# Patient Record
Sex: Female | Born: 1980 | Race: Black or African American | Hispanic: No | Marital: Single | Smoking: Never smoker
Health system: Southern US, Community
[De-identification: ages and names within clinical notes are randomized; demographics above are authoritative.]

## PROBLEM LIST (undated history)

## (undated) DIAGNOSIS — I10 Essential (primary) hypertension: Secondary | ICD-10-CM

## (undated) DIAGNOSIS — E669 Obesity, unspecified: Secondary | ICD-10-CM

## (undated) DIAGNOSIS — I2699 Other pulmonary embolism without acute cor pulmonale: Secondary | ICD-10-CM

## (undated) HISTORY — DX: Essential (primary) hypertension: I10

## (undated) HISTORY — PX: WISDOM TOOTH EXTRACTION: SHX21

## (undated) HISTORY — PX: MOUTH SURGERY: SHX715

---

## 2003-01-28 ENCOUNTER — Emergency Department (HOSPITAL_COMMUNITY): Admission: EM | Admit: 2003-01-28 | Discharge: 2003-01-28 | Payer: Self-pay | Admitting: Emergency Medicine

## 2003-01-30 ENCOUNTER — Inpatient Hospital Stay (HOSPITAL_COMMUNITY): Admission: AD | Admit: 2003-01-30 | Discharge: 2003-01-30 | Payer: Self-pay | Admitting: *Deleted

## 2003-02-06 ENCOUNTER — Encounter: Admission: RE | Admit: 2003-02-06 | Discharge: 2003-02-06 | Payer: Self-pay | Admitting: *Deleted

## 2003-02-13 ENCOUNTER — Encounter: Admission: RE | Admit: 2003-02-13 | Discharge: 2003-02-13 | Payer: Self-pay | Admitting: *Deleted

## 2003-02-16 ENCOUNTER — Ambulatory Visit (HOSPITAL_COMMUNITY): Admission: RE | Admit: 2003-02-16 | Discharge: 2003-02-16 | Payer: Self-pay | Admitting: *Deleted

## 2003-02-16 ENCOUNTER — Encounter: Admission: RE | Admit: 2003-02-16 | Discharge: 2003-02-16 | Payer: Self-pay | Admitting: *Deleted

## 2003-02-20 ENCOUNTER — Encounter: Admission: RE | Admit: 2003-02-20 | Discharge: 2003-02-20 | Payer: Self-pay | Admitting: *Deleted

## 2003-02-27 ENCOUNTER — Ambulatory Visit (HOSPITAL_COMMUNITY): Admission: RE | Admit: 2003-02-27 | Discharge: 2003-02-27 | Payer: Self-pay | Admitting: Family Medicine

## 2003-02-27 ENCOUNTER — Encounter: Admission: RE | Admit: 2003-02-27 | Discharge: 2003-02-27 | Payer: Self-pay | Admitting: *Deleted

## 2003-03-01 ENCOUNTER — Inpatient Hospital Stay (HOSPITAL_COMMUNITY): Admission: AD | Admit: 2003-03-01 | Discharge: 2003-03-04 | Payer: Self-pay | Admitting: Obstetrics & Gynecology

## 2003-08-04 ENCOUNTER — Emergency Department (HOSPITAL_COMMUNITY): Admission: EM | Admit: 2003-08-04 | Discharge: 2003-08-04 | Payer: Self-pay | Admitting: Emergency Medicine

## 2005-04-20 ENCOUNTER — Emergency Department (HOSPITAL_COMMUNITY): Admission: EM | Admit: 2005-04-20 | Discharge: 2005-04-21 | Payer: Self-pay | Admitting: Emergency Medicine

## 2005-12-04 ENCOUNTER — Emergency Department (HOSPITAL_COMMUNITY): Admission: EM | Admit: 2005-12-04 | Discharge: 2005-12-05 | Payer: Self-pay | Admitting: Emergency Medicine

## 2010-01-07 ENCOUNTER — Ambulatory Visit: Payer: Self-pay | Admitting: Obstetrics and Gynecology

## 2010-01-07 ENCOUNTER — Inpatient Hospital Stay (HOSPITAL_COMMUNITY): Admission: AD | Admit: 2010-01-07 | Discharge: 2010-01-07 | Payer: Self-pay | Admitting: Obstetrics

## 2010-03-06 ENCOUNTER — Ambulatory Visit (HOSPITAL_COMMUNITY)
Admission: RE | Admit: 2010-03-06 | Discharge: 2010-03-06 | Payer: Self-pay | Source: Home / Self Care | Admitting: Obstetrics

## 2010-05-23 ENCOUNTER — Inpatient Hospital Stay (HOSPITAL_COMMUNITY)
Admission: AD | Admit: 2010-05-23 | Discharge: 2010-05-23 | Disposition: A | Discharge status: Home / Self Care | Payer: Medicaid Other | Source: Ambulatory Visit | Attending: Obstetrics | Admitting: Obstetrics

## 2010-05-23 DIAGNOSIS — Z3480 Encounter for supervision of other normal pregnancy, unspecified trimester: Secondary | ICD-10-CM | POA: Insufficient documentation

## 2010-05-23 DIAGNOSIS — Z298 Encounter for other specified prophylactic measures: Secondary | ICD-10-CM | POA: Insufficient documentation

## 2010-05-23 DIAGNOSIS — Z2989 Encounter for other specified prophylactic measures: Secondary | ICD-10-CM | POA: Insufficient documentation

## 2010-05-26 LAB — RH IMMUNE GLOBULIN WORKUP (NOT WOMEN'S HOSP)
ABO/RH(D): B NEG
Antibody Screen: NEGATIVE
Unit division: 0

## 2010-06-18 LAB — POCT PREGNANCY, URINE: Preg Test, Ur: POSITIVE

## 2010-07-06 ENCOUNTER — Inpatient Hospital Stay (HOSPITAL_COMMUNITY)
Admission: AD | Admit: 2010-07-06 | Discharge: 2010-07-12 | DRG: 775 | Disposition: A | Discharge status: Home / Self Care | Payer: Medicaid Other | Source: Ambulatory Visit | Attending: Obstetrics & Gynecology | Admitting: Obstetrics & Gynecology

## 2010-07-06 DIAGNOSIS — Z0489 Encounter for examination and observation for other specified reasons: Secondary | ICD-10-CM

## 2010-07-06 DIAGNOSIS — O365990 Maternal care for other known or suspected poor fetal growth, unspecified trimester, not applicable or unspecified: Principal | ICD-10-CM | POA: Diagnosis present

## 2010-07-06 DIAGNOSIS — IMO0002 Reserved for concepts with insufficient information to code with codable children: Secondary | ICD-10-CM

## 2010-07-06 LAB — CBC
HCT: 34.3 % — ABNORMAL LOW (ref 36.0–46.0)
Hemoglobin: 11.4 g/dL — ABNORMAL LOW (ref 12.0–15.0)
MCH: 27.9 pg (ref 26.0–34.0)
MCHC: 33.2 g/dL (ref 30.0–36.0)
MCV: 84.1 fL (ref 78.0–100.0)
Platelets: 274 10*3/uL (ref 150–400)
RBC: 4.08 MIL/uL (ref 3.87–5.11)
RDW: 13.6 % (ref 11.5–15.5)
WBC: 11.7 10*3/uL — ABNORMAL HIGH (ref 4.0–10.5)

## 2010-07-07 LAB — RPR: RPR Ser Ql: NONREACTIVE

## 2010-07-09 NOTE — H&P (Signed)
  NAMEROBIN, Karen Robles              ACCOUNT NO.:  [id number]  MEDICAL RECORD NO.:  [id number]           PATIENT TYPE:  I  LOCATION:  9164                          FACILITY:  [facility]  PHYSICIAN:  Roseanna Rainbow, M.D.DATE OF BIRTH:  [birth year]  DATE OF ADMISSION:  07/06/2010 DATE OF DISCHARGE:                             HISTORY & PHYSICAL   CHIEF COMPLAINT:  The patient is a 30 year old para 1 with an estimated date of confinement of April 8 for induction of labor secondary to IUGR.  HISTORY OF PRESENT ILLNESS:  The patient had an ultrasound for growth performed in the early third trimeste that was consistent with IUGR with the estimated fetal weight at less than 10th percentile. Antenatal testing has been reassuring.  ALLERGIES:  No known drug allergies.  MEDICATIONS:  Please see the medication reconciliation form.  OBSTETRIC RISK FACTORS:  Please see the above.  Rh negative, non sensitized and fetal pyelectasis on an ultrasound that resolved on a subsequent study.  PRENATAL LABS:  Blood type is B negative, antibody screen negative, 2- hour GTT normal.  Hepatitis B surface antigen negative.  Hematocrit 36.2, hemoglobin 11.5, HIV nonreactive, platelets 332,000, RPR nonreactive, rubella immune.  Sickle cell negative.  Urine culture and sensitivity negative.  GBS negative on March 7.  PAST OB HISTORY:  In November 2004, she was delivered at term 4-pound female, small for gestational age.  No other complications.  PAST GYN HISTORY:  There is a history of gonorrhea.  PAST MEDICAL HISTORY:  She denies.  PAST SURGICAL HISTORY:  She denies.  SOCIAL HISTORY:  She is a homemaker, single, does not give any significant history of alcohol usage, has no significant smoking history.  Denies illicit drug use.  FAMILY HISTORY:  Remarkable for hypertension.  REVIEW OF SYSTEMS:  Noncontributory.  PHYSICAL EXAM:  VITAL SIGNS:  Stable, afebrile.  Fetal heart tracing baseline 120,  moderate long-term variability.  Tocodynamometer, no uterine contractions.  Sterile vaginal exam per the RN.  ASSESSMENT:  Primipara at 39 weeks with the pregnancy complicated by intrauterine growth restriction, estimated fetal weight is less than 10th percentile.  Unfavorable Bishop score.  Presently, a category 1 fetal heart tracing.  PLAN:  Admission, II stage induction of labor.     Roseanna Rainbow, M.D.     Judee Clara  D:  07/07/2010  T:  07/07/2010  Job:  [id number]  Electronically Signed by Antionette Char M.D. on 07/09/2010 10:17:33 PM

## 2010-07-10 ENCOUNTER — Other Ambulatory Visit: Payer: Self-pay | Admitting: Obstetrics & Gynecology

## 2010-07-10 ENCOUNTER — Inpatient Hospital Stay (HOSPITAL_COMMUNITY): Payer: Medicaid Other

## 2010-07-11 LAB — CBC
HCT: 29.9 % — ABNORMAL LOW (ref 36.0–46.0)
Hemoglobin: 9.7 g/dL — ABNORMAL LOW (ref 12.0–15.0)
MCH: 27.5 pg (ref 26.0–34.0)
MCHC: 32.4 g/dL (ref 30.0–36.0)
MCV: 84.7 fL (ref 78.0–100.0)
Platelets: 259 10*3/uL (ref 150–400)
RBC: 3.53 MIL/uL — ABNORMAL LOW (ref 3.87–5.11)
RDW: 14 % (ref 11.5–15.5)
WBC: 23.4 10*3/uL — ABNORMAL HIGH (ref 4.0–10.5)

## 2010-07-12 LAB — RH IMMUNE GLOB WKUP(>/=20WKS)(NOT WOMEN'S HOSP)
Fetal Screen: NEGATIVE
Unit division: 0

## 2010-07-13 ENCOUNTER — Inpatient Hospital Stay (HOSPITAL_COMMUNITY): Admission: AD | Admit: 2010-07-13 | Payer: Self-pay | Admitting: Obstetrics

## 2010-07-22 ENCOUNTER — Other Ambulatory Visit (HOSPITAL_COMMUNITY): Payer: Medicaid Other

## 2010-08-14 NOTE — Discharge Summary (Signed)
  Karen Robles, Karen Robles              ACCOUNT NO.:  [id number]  MEDICAL RECORD NO.:  [id number]           PATIENT TYPE:  I  LOCATION:  9114                          [facility]ILITY:  [facility]  PHYSICIAN:  Charles A. Clearance Coots, M.D.DATE OF BIRTH:  1980-08-10  DATE OF ADMISSION:  07/06/2010 DATE OF DISCHARGE:  07/12/2010                              DISCHARGE SUMMARY   ADMITTING DIAGNOSIS:  Intrauterine growth restriction, 39 weeks' gestation.  DISCHARGE DIAGNOSIS:  Intrauterine growth restriction, 39 weeks' gestation status post induction of labor and normal spontaneous vaginal delivery of viable female on July 10, 2010, at 10:46, Apgars 9 at 1 minute and 9 at 5 minutes, weight of 3100 g, length of 50.17 cm.  Mother and infant discharged home in good condition.  REASON FOR ADMISSION:  This 30 year old para 1 with estimated date of confinement of July 13, 2010, presents for induction of labor secondary to intrauterine growth restriction.  The patient had an ultrasound for growth performed in the early third trimester that was consistent with IUGR with the estimated fetal weight at less than 10th percentile. Antenatal testing has been reassuring.  PAST MEDICAL HISTORY:  Surgery none.  Illnesses none.  MEDICATIONS:  Prenatal vitamins.  ALLERGIES:  No known drug allergies.  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  FAMILY HISTORY:  Hypertension.  OBSTETRICAL RISK FACTORS:  Intrauterine growth restriction, Rh negative.  PAST OBSTETRICAL HISTORY:  Status post term 4-pound female normal spontaneous vaginal delivery in November 2004.  The infant was small for gestational age, otherwise, no other complications.  PAST GYNECOLOGIC HISTORY:  Positive for gonorrhea.  PHYSICAL EXAMINATION:  GENERAL:  A well-nourished, well-developed female, in no acute distress.  She is  afebrile. VITAL SIGNS:  Stable. LUNGS:  Clear to auscultation bilaterally. HEART:  Regular rate and rhythm. ABDOMEN:  Gravid, nontender. PELVIC:  Cervix is long, closed, vertex at -3 station.  ADMITTING LABORATORY DATA:  Hemoglobin 11.4, hematocrit 34.3, white blood cell count 11,700, platelets 274,000.  RPR was nonreactive.  HOSPITAL COURSE:  The patient was admitted and underwent two-stage induction of labor with cervical ripening which was unsuccessful after the first 24 hours.  Foley bulb ripening was then done which was successful, and the patient progressed in labor to normal spontaneous vaginal delivery of viable female infant on July 10, 2010.  There were no intrapartum complications.  Postpartum course was uncomplicated.  The patient was discharged home on postpartum day 2 in good condition.  DISCHARGE LABORATORY DATA:  Hemoglobin 9.7, hematocrit 29.3, white blood cell count 23,000, platelets 269,000.  DISCHARGE DISPOSITION:  Medications:  Continue prenatal vitamins. Ibuprofen was prescribed for pain.  Routine written instructions were given for discharge after vaginal delivery.  The patient is to call office for followup appointment in 6 weeks.     Charles A. Clearance Coots, M.D.     CAH/MEDQ  D:  08/08/2010  T:  08/09/2010  Job:  [id number]  Electronically Signed by Coral Ceo M.D. on 08/14/2010 09:16:05 AM

## 2010-10-25 ENCOUNTER — Emergency Department (HOSPITAL_COMMUNITY)
Admission: EM | Admit: 2010-10-25 | Discharge: 2010-10-25 | Disposition: A | Discharge status: Home / Self Care | Payer: Medicaid Other | Attending: Emergency Medicine | Admitting: Emergency Medicine

## 2010-10-25 DIAGNOSIS — K089 Disorder of teeth and supporting structures, unspecified: Secondary | ICD-10-CM | POA: Insufficient documentation

## 2010-10-25 DIAGNOSIS — E669 Obesity, unspecified: Secondary | ICD-10-CM | POA: Insufficient documentation

## 2010-10-25 DIAGNOSIS — K029 Dental caries, unspecified: Secondary | ICD-10-CM | POA: Insufficient documentation

## 2010-10-25 DIAGNOSIS — R221 Localized swelling, mass and lump, neck: Secondary | ICD-10-CM | POA: Insufficient documentation

## 2010-10-25 DIAGNOSIS — R220 Localized swelling, mass and lump, head: Secondary | ICD-10-CM | POA: Insufficient documentation

## 2012-07-05 ENCOUNTER — Ambulatory Visit: Payer: Self-pay

## 2012-07-07 ENCOUNTER — Ambulatory Visit (INDEPENDENT_AMBULATORY_CARE_PROVIDER_SITE_OTHER): Payer: Medicaid Other | Admitting: *Deleted

## 2012-07-07 ENCOUNTER — Encounter: Payer: Self-pay | Admitting: *Deleted

## 2012-07-07 VITALS — BP 126/90 | HR 100 | Temp 97.0°F | Wt 268.0 lb

## 2012-07-07 DIAGNOSIS — Z3049 Encounter for surveillance of other contraceptives: Secondary | ICD-10-CM

## 2012-07-07 MED ORDER — MEDROXYPROGESTERONE ACETATE 150 MG/ML IM SUSP
150.0000 mg | Freq: Once | INTRAMUSCULAR | Status: AC
  Administered 2012-07-07: 150 mg via INTRAMUSCULAR

## 2012-07-07 NOTE — Progress Notes (Signed)
Medroxyprogesterone given IM in right deltoid per pt's request. Pt tolerated well.

## 2012-07-07 NOTE — Patient Instructions (Signed)
Return to clinic as directed and as needed.

## 2012-09-28 ENCOUNTER — Ambulatory Visit (INDEPENDENT_AMBULATORY_CARE_PROVIDER_SITE_OTHER): Payer: Medicaid Other | Admitting: *Deleted

## 2012-09-28 ENCOUNTER — Ambulatory Visit: Payer: Medicaid Other

## 2012-09-28 VITALS — BP 133/96 | HR 81 | Temp 98.2°F | Ht 64.0 in | Wt 265.0 lb

## 2012-09-28 DIAGNOSIS — Z309 Encounter for contraceptive management, unspecified: Secondary | ICD-10-CM

## 2012-09-28 DIAGNOSIS — IMO0001 Reserved for inherently not codable concepts without codable children: Secondary | ICD-10-CM

## 2012-09-28 MED ORDER — MEDROXYPROGESTERONE ACETATE 150 MG/ML IM SUSP
150.0000 mg | INTRAMUSCULAR | Status: AC
  Administered 2012-09-28 – 2013-08-23 (×4): 150 mg via INTRAMUSCULAR

## 2012-09-28 NOTE — Progress Notes (Signed)
Patient is here today for her Depo injection.  Patient tolerated well.  RTO for next injection 12/20/12.

## 2012-12-19 ENCOUNTER — Other Ambulatory Visit: Payer: Self-pay | Admitting: *Deleted

## 2012-12-19 DIAGNOSIS — IMO0001 Reserved for inherently not codable concepts without codable children: Secondary | ICD-10-CM

## 2012-12-19 MED ORDER — MEDROXYPROGESTERONE ACETATE 150 MG/ML IM SUSP: 150.0000 mg | INTRAMUSCULAR | Status: DC

## 2012-12-20 ENCOUNTER — Ambulatory Visit (INDEPENDENT_AMBULATORY_CARE_PROVIDER_SITE_OTHER): Payer: Medicaid Other | Admitting: Obstetrics

## 2012-12-20 VITALS — BP 134/88 | HR 91 | Temp 97.8°F | Wt 269.0 lb

## 2012-12-20 DIAGNOSIS — Z3042 Encounter for surveillance of injectable contraceptive: Secondary | ICD-10-CM

## 2012-12-20 DIAGNOSIS — Z3049 Encounter for surveillance of other contraceptives: Secondary | ICD-10-CM

## 2012-12-20 NOTE — Progress Notes (Signed)
Patient here for depo injection, she was advised that she is past due for annual exam. She was advised to return between 03/07/2013 and 03/21/2013 for annual exam with depo injection. Patient was advised she would not receive next injection without annual exam being she is past due. Patient has verbalized understanding and has agreed as instructed to schedule annual exam with depo.

## 2013-01-22 ENCOUNTER — Emergency Department (HOSPITAL_COMMUNITY)
Admission: EM | Admit: 2013-01-22 | Discharge: 2013-01-22 | Disposition: A | Discharge status: Home / Self Care | Payer: Medicaid Other | Attending: Emergency Medicine | Admitting: Emergency Medicine

## 2013-01-22 ENCOUNTER — Encounter (HOSPITAL_COMMUNITY): Payer: Self-pay | Admitting: Emergency Medicine

## 2013-01-22 DIAGNOSIS — Z79899 Other long term (current) drug therapy: Secondary | ICD-10-CM | POA: Insufficient documentation

## 2013-01-22 DIAGNOSIS — E669 Obesity, unspecified: Secondary | ICD-10-CM | POA: Insufficient documentation

## 2013-01-22 DIAGNOSIS — B009 Herpesviral infection, unspecified: Secondary | ICD-10-CM | POA: Insufficient documentation

## 2013-01-22 DIAGNOSIS — B001 Herpesviral vesicular dermatitis: Secondary | ICD-10-CM

## 2013-01-22 HISTORY — DX: Obesity, unspecified: E66.9

## 2013-01-22 MED ORDER — DOCOSANOL 10 % EX CREA: 1.0000 [drp] | TOPICAL_CREAM | Freq: Every day | CUTANEOUS | Status: DC

## 2013-01-22 NOTE — ED Provider Notes (Signed)
Medical screening examination/treatment/procedure(s) were conducted as a shared visit with non-physician practitioner(s) and myself.  I personally evaluated the patient during the encounter  Patient with R sided lower lip swelling. Cold sore on R lower lip with swelling. Burning sensation associated with this. No stridor, airway swelling. Airway patent, no SOB. Given cold sore precautions and instructions.   Dagmar Hait, MD 01/22/13 2036

## 2013-01-22 NOTE — ED Provider Notes (Signed)
CSN: [id number]     Arrival date & time 01/22/13  1831 History   First MD Initiated Contact with Patient 01/22/13 1837     Chief Complaint  Patient presents with  . Oral Swelling   (Consider location/radiation/quality/duration/timing/severity/associated sxs/prior Treatment) HPI Pt is a 32yo female c/o right lower lip swelling, redness and pain that started 1 week ago.  Pain is burning in nature, 2/10, worse with application of warm compresses which causes lesion to drain "white stuff."  Has tried benadryl, ibuprofen, cold compresses and neosporin without relief.  Denies cold-like symptoms, hx of similar symptoms, or known allergies. Denies being bit by anything. Denies throat pain, trouble breathing or swallowing.  Past Medical History  Diagnosis Date  . Obesity    Past Surgical History  Procedure Laterality Date  . Mouth surgery     Family History  Problem Relation Age of Onset  . Hypertension Mother     maternal aunts & uncles  . Cancer Neg Hx   . Diabetes Neg Hx   . Heart disease Neg Hx   . Healthy Brother    History  Substance Use Topics  . Smoking status: Never Smoker   . Smokeless tobacco: Never Used  . Alcohol Use: No   OB History   Grav Para Term Preterm Abortions TAB SAB Ect Mult Living   2 2 2             Review of Systems  Constitutional: Negative for fever and chills.  HENT: Positive for mouth sores ( right lower lip). Negative for congestion, dental problem, rhinorrhea, sore throat and trouble swallowing.   Gastrointestinal: Negative for nausea and vomiting.  All other systems reviewed and are negative.    Allergies  Review of patient's allergies indicates no known allergies.  Home Medications   Current Outpatient Rx  Name  Route  Sig  Dispense  Refill  . ibuprofen (ADVIL,MOTRIN) 200 MG tablet   Oral   Take 400 mg by mouth every 6 (six) hours as needed for pain.         . medroxyPROGESTERone (DEPO-PROVERA) 150 MG/ML injection   Intramuscular   Inject 1 mL (150 mg total) into the muscle every 3 (three) months.   1 mL   3   . Docosanol 10 % CREA   Apply externally   Apply 1 drop topically 5 (five) times daily. Apply cream 5 times daily over cold sore until symptoms resolve.   1 Tube   0    BP 140/98  Pulse 90  Temp(Src) 97.9 F (36.6 C) (Oral)  Resp 16  SpO2 100% Physical Exam  Nursing note and vitals reviewed. Constitutional: She appears well-developed and well-nourished. No distress.  HENT:  Head: Normocephalic and atraumatic.  Right Ear: Hearing, tympanic membrane, external ear and ear canal normal.  Left Ear: Hearing, tympanic membrane, external ear and ear canal normal.  Nose: Nose normal.  Mouth/Throat: Uvula is midline, oropharynx is clear and moist and mucous membranes are normal. She does not have dentures. Oral lesions ( right lower lip erythema with centralized yellow patches.  TTP) present. No trismus in the jaw. Normal dentition. No dental abscesses, uvula swelling, lacerations or dental caries. No oropharyngeal exudate, posterior oropharyngeal edema, posterior oropharyngeal erythema or tonsillar abscesses.    Eyes: Conjunctivae are normal. No scleral icterus.  Neck: Normal range of motion.  Cardiovascular: Normal rate, regular rhythm and normal heart sounds.   Pulmonary/Chest: Effort normal and breath sounds normal. No respiratory distress. She  has no wheezes. She has no rales. She exhibits no tenderness.  Abdominal: Soft. Bowel sounds are normal. She exhibits no distension and no mass. There is no tenderness. There is no rebound and no guarding.  Musculoskeletal: Normal range of motion.  Neurological: She is alert.  Skin: Skin is warm and dry. She is not diaphoretic.    ED Course  Procedures (including critical care time) Labs Review Labs Reviewed - No data to display Imaging Review No results found.  EKG Interpretation   None       MDM   1. Cold sore    Pt presenting with oral lesion on  right lower lip, consistent with that of a cold sore.  Not concerned for allergic reaction or anaphylaxis.  Oropharynx unremarkable. No lesions in throat, tonsillar edema or erythema.  Rx: Abreva. All questions answered and concerns addressed. Will discharge pt home and have pt f/u with [facility] info provided. Return precautions given. Pt verbalized understanding and agreement with tx plan. Vitals: unremarkable. Discharged in stable condition.  Pt info packet on cold sores provided.   Discussed pt with attending during ED encounter and agrees with plan.   Junius Finner, PA-C 01/22/13 1950

## 2013-01-22 NOTE — ED Notes (Signed)
Pt has cold sore on her bottom right lip. Pt states that she thought she was allergic to something but denies changes to daily routine and the sore has been there for a couple of days.

## 2013-01-22 NOTE — ED Notes (Signed)
Pt reports swelling and burning pain to right lower lip x 1 week. Airway intact.

## 2013-02-08 ENCOUNTER — Encounter: Payer: Self-pay | Admitting: Obstetrics & Gynecology

## 2013-02-08 ENCOUNTER — Ambulatory Visit (INDEPENDENT_AMBULATORY_CARE_PROVIDER_SITE_OTHER): Payer: Medicaid Other | Admitting: Obstetrics & Gynecology

## 2013-02-08 VITALS — BP 146/95 | HR 92 | Temp 98.1°F | Ht 64.0 in | Wt 275.0 lb

## 2013-02-08 DIAGNOSIS — Z0000 Encounter for general adult medical examination without abnormal findings: Secondary | ICD-10-CM

## 2013-02-08 LAB — HDL CHOLESTEROL: HDL: 39 mg/dL — ABNORMAL LOW (ref >39)

## 2013-02-08 LAB — HEMOGLOBIN A1C
Hgb A1c MFr Bld: 5.7 % — ABNORMAL HIGH (ref <5.7)
Mean Plasma Glucose: 117 mg/dL — ABNORMAL HIGH (ref <117)

## 2013-02-08 LAB — HEMOGLOBIN AND HEMATOCRIT, BLOOD
HCT: 39.3 % (ref 36.0–46.0)
Hemoglobin: 13 g/dL (ref 12.0–15.0)

## 2013-02-08 LAB — CHOLESTEROL, TOTAL: Cholesterol: 135 mg/dL (ref 0–200)

## 2013-02-08 NOTE — Progress Notes (Signed)
Subjective:     Karen Robles is a 32 y.o. female here for a routine exam.  Current complaints: pt denies any concerns at this time.  Personal health questionnaire reviewed: yes.   Gynecologic History No LMP recorded. Patient has had an injection. Contraception: injection Last Pap: 2012. Results were: normal   Obstetric History OB History  Gravida Para Term Preterm AB SAB TAB Ectopic Multiple Living  2 2 2            # Outcome Date GA Lbr Len/2nd Weight Sex Delivery Anes PTL Lv  2 TRM 2012 [redacted]w[redacted]d   F      1 TRM 2004   6 lb 4 oz (2.835 kg) F           The following portions of the patient's history were reviewed and updated as appropriate: allergies, current medications, past family history, past medical history, past social history, past surgical history and problem list.  Review of Systems Pertinent items are noted in HPI.    Objective:      General appearance: alert Breasts: normal appearance, no masses or tenderness Abdomen: soft, non-tender; bowel sounds normal; no masses,  no organomegaly Pelvic: cervix normal in appearance, external genitalia normal, no adnexal masses or tenderness, uterus normal size, shape, and consistency and vagina normal without discharge    Assessment:    Healthy female exam.    Plan:   Return prn

## 2013-02-09 ENCOUNTER — Encounter: Payer: Self-pay | Admitting: Obstetrics & Gynecology

## 2013-02-09 NOTE — Patient Instructions (Signed)

## 2013-02-10 LAB — PAP IG, CT-NG, RFX HPV ASCU
Chlamydia Probe Amp: POSITIVE — AB
GC Probe Amp: NEGATIVE

## 2013-02-11 ENCOUNTER — Encounter: Payer: Self-pay | Admitting: Obstetrics & Gynecology

## 2013-02-11 DIAGNOSIS — A749 Chlamydial infection, unspecified: Secondary | ICD-10-CM | POA: Insufficient documentation

## 2013-02-16 ENCOUNTER — Other Ambulatory Visit: Payer: Self-pay | Admitting: *Deleted

## 2013-02-16 DIAGNOSIS — A749 Chlamydial infection, unspecified: Secondary | ICD-10-CM

## 2013-02-16 MED ORDER — CEFIXIME 200 MG/5ML PO SUSR: 400.0000 mg | Freq: Once | ORAL | Status: DC

## 2013-02-16 MED ORDER — AZITHROMYCIN 500 MG PO TABS: 1000.0000 mg | ORAL_TABLET | Freq: Every day | ORAL | Status: DC

## 2013-02-28 ENCOUNTER — Ambulatory Visit: Payer: Medicaid Other | Admitting: *Deleted

## 2013-03-07 ENCOUNTER — Ambulatory Visit: Payer: Medicaid Other

## 2013-03-07 ENCOUNTER — Ambulatory Visit (INDEPENDENT_AMBULATORY_CARE_PROVIDER_SITE_OTHER): Payer: Medicaid Other | Admitting: Obstetrics

## 2013-03-07 VITALS — BP 139/98 | HR 87 | Wt 278.0 lb

## 2013-03-07 DIAGNOSIS — Z3049 Encounter for surveillance of other contraceptives: Secondary | ICD-10-CM

## 2013-03-07 DIAGNOSIS — I10 Essential (primary) hypertension: Secondary | ICD-10-CM

## 2013-03-07 MED ORDER — TRIAMTERENE-HCTZ 37.5-25 MG PO CAPS: 1.0000 | ORAL_CAPSULE | Freq: Every day | ORAL | Status: DC

## 2013-03-07 NOTE — Progress Notes (Signed)
Patient in office today for a Depo injection. Pt is on time for her injection. Pt tolerated injection well.  Patient had elevated BP's.  Started patient on Dyazide and referred to IM for BP management and health maintenance.

## 2013-04-06 DIAGNOSIS — I2699 Other pulmonary embolism without acute cor pulmonale: Secondary | ICD-10-CM

## 2013-04-06 HISTORY — DX: Other pulmonary embolism without acute cor pulmonale: I26.99

## 2013-04-12 ENCOUNTER — Encounter: Payer: Self-pay | Admitting: Dietician

## 2013-04-12 ENCOUNTER — Encounter: Payer: Medicaid Other | Attending: Obstetrics & Gynecology | Admitting: Dietician

## 2013-04-12 DIAGNOSIS — Z713 Dietary counseling and surveillance: Secondary | ICD-10-CM | POA: Insufficient documentation

## 2013-04-12 NOTE — Patient Instructions (Addendum)
http://[email or web address]://[email or web address]/services/[email or web address]/ - check out the bariatric seminar at the library to see if you are interested in surgery. Fill half of your plate with vegetable (broccoli, green beans or salad). Try to eat a meal or snack every 3-5 hours you are awake (peanut butter crackers for snacks). Work on eating meals slowly, chewing thoroughly, and eating with the TV off.  Aim to get some exercise with a goal of 30 minutes most day days. (ANY additional exercise is good - start with 5 or 10 minutes of walking).

## 2013-04-12 NOTE — Progress Notes (Signed)
  Medical Nutrition Therapy:  Appt start time: 0945 end time:  1045.  Assessment:  Primary concerns today: Karen Robles is here today since her doctor recommended that she talk to a dietitian about pre-diabetes. Her Hgb A1c was 5.7% in 02/2013. Doctor also gave her a pamphlet for bariatric surgery. Has tried lipozene for weight loss for a 30 days which worked a little bit, has tried [employer] Fast along with a low calorie diet, and is looking to try Hydroxycut. States that she has been "chubby" since she was little.  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 States that she sometimes does the grocery shopping and food preparation, though will eat fast food about 2 x week.   Preferred Learning Style:   No preference indicated   Learning Readiness:   Ready  MEDICATIONS:  See list   DIETARY INTAKE:  Avoided foods include dark greens, lima beans, or peas.    24-hr recall:  B ( AM): sausage or bacon and eggs with jelly and toast with ginger ale Snk ( AM): none  L (2:30 PM): sandwich with ham and cheese and chips or burritos with ginger ale Snk ( PM): none D ( PM): chicken, pinto beans or "grab something" Snk ( PM): none Beverages: ginger ale Or gatorade/powerade   Usual physical activity: none besides working  Estimated energy needs: 1800 calories 200 g carbohydrates 135 g protein 50 g fat  Progress Towards Goal(s):  In progress.   Nutritional Diagnosis:  Karen Robles Overweight/obesity As related to history of energy dense food choices and lack of physical activity.  As evidenced by BMI of 46.5.    Intervention:  Nutrition counseling provided. Discussed the process for bariatric surgery. Since Karen Robles works during the regularly scheduled seminar time, discussed watching the seminar online at [employer] public library.   Plan: http://[email or web address]/Http://www.[email or web address]/services/bariatrics/ - check out the bariatric seminar at the library to see if you are  interested in surgery. Fill half of your plate with vegetable (broccoli, green beans or salad). Try to eat a meal or snack every 3-5 hours you are awake (peanut butter crackers for snacks). Work on eating meals slowly, chewing thoroughly, and eating with the TV off.  Aim to get some exercise with a goal of 30 minutes most day days. (ANY additional exercise is good - start with 5 or 10 minutes of walking).  Teaching Method Utilized:  Visual Auditory Hands on  Handouts given during visit include:  MyPlate Handout  15 g CHO Snacks  Barriers to learning/adherence to lifestyle change: limited income  Demonstrated degree of understanding via:  Teach Back   Monitoring/Evaluation:  Dietary intake, exercise, and body weight in 2 month(s).

## 2013-05-23 ENCOUNTER — Ambulatory Visit: Payer: Medicaid Other | Admitting: Obstetrics

## 2013-05-25 ENCOUNTER — Encounter: Payer: Self-pay | Admitting: Obstetrics

## 2013-05-25 ENCOUNTER — Ambulatory Visit: Payer: Medicaid Other | Admitting: Obstetrics

## 2013-05-25 ENCOUNTER — Ambulatory Visit (INDEPENDENT_AMBULATORY_CARE_PROVIDER_SITE_OTHER): Payer: Medicaid Other | Admitting: Obstetrics

## 2013-05-25 VITALS — BP 125/87 | HR 91 | Temp 97.6°F | Ht 64.0 in | Wt 277.0 lb

## 2013-05-25 DIAGNOSIS — A5609 Other chlamydial infection of lower genitourinary tract: Secondary | ICD-10-CM

## 2013-05-25 DIAGNOSIS — Z3049 Encounter for surveillance of other contraceptives: Secondary | ICD-10-CM

## 2013-05-25 NOTE — Progress Notes (Signed)
Subjective:     Karen Robles is a 33 y.o. female here for a routine exam.  Current complaints: Paint in office today for follow up visit for TOC for chlamydia. Patient is also in office today for DEPO injection. Injection given in Right arm. Patient tolerated well. Patient notified to return to office Aug 16, 2013 for next Depo Injection.   Personal health questionnaire reviewed: yes.   Gynecologic History No LMP recorded. Patient has had an injection. Contraception: Depo-Provera injections Last Pap: 02/08/2013. Results were: normal  Obstetric History OB History  Gravida Para Term Preterm AB SAB TAB Ectopic Multiple Living  2 2 2            # Outcome Date GA Lbr Len/2nd Weight Sex Delivery Anes PTL Lv  2 TRM 2012 [id number]   F      1 TRM 2004   6 lb 4 oz (2.835 kg) F           The following portions of the patient's history were reviewed and updated as appropriate: allergies, current medications, past family history, past medical history, past social history, past surgical history and problem list.  Review of Systems Pertinent items are noted in HPI.    Objective:    General appearance: alert and no distress Abdomen: normal findings: soft, non-tender Pelvic: cervix normal in appearance, external genitalia normal, no adnexal masses or tenderness, no cervical motion tenderness, rectovaginal septum normal, uterus normal size, shape, and consistency and vagina normal without discharge    Assessment:    H/O Chlamydia cervicitis, treated.  Contraceptive surveillance.   Plan:   TOC cultures done for Chlamydia.  Education reviewed: safe sex/STD prevention. Contraception: Depo-Provera injections. Follow up in: several months.

## 2013-05-25 NOTE — Addendum Note (Signed)
Addended by: Marya LandryFOSTER, Shacola Schussler D on: 05/25/2013 03:07 PM   Modules accepted: Orders

## 2013-05-26 LAB — GC/CHLAMYDIA PROBE AMP
CT Probe RNA: NEGATIVE
GC Probe RNA: NEGATIVE

## 2013-05-26 LAB — WET PREP BY MOLECULAR PROBE
Candida species: POSITIVE — AB
Gardnerella vaginalis: NEGATIVE
Trichomonas vaginosis: NEGATIVE

## 2013-06-04 ENCOUNTER — Other Ambulatory Visit: Payer: Self-pay | Admitting: *Deleted

## 2013-06-04 DIAGNOSIS — B373 Candidiasis of vulva and vagina: Secondary | ICD-10-CM

## 2013-06-04 DIAGNOSIS — B3731 Acute candidiasis of vulva and vagina: Secondary | ICD-10-CM

## 2013-06-04 MED ORDER — FLUCONAZOLE 150 MG PO TABS: 150.0000 mg | ORAL_TABLET | Freq: Once | ORAL | Status: DC

## 2013-06-12 ENCOUNTER — Ambulatory Visit: Payer: Medicaid Other | Admitting: Dietician

## 2013-06-20 ENCOUNTER — Other Ambulatory Visit: Payer: Self-pay | Admitting: Obstetrics

## 2013-06-21 ENCOUNTER — Encounter: Payer: Medicaid Other | Attending: Obstetrics & Gynecology | Admitting: Dietician

## 2013-06-21 VITALS — Ht 64.0 in | Wt 277.8 lb

## 2013-06-21 DIAGNOSIS — E669 Obesity, unspecified: Secondary | ICD-10-CM

## 2013-06-21 DIAGNOSIS — Z713 Dietary counseling and surveillance: Secondary | ICD-10-CM | POA: Insufficient documentation

## 2013-06-21 NOTE — Progress Notes (Signed)
  Medical Nutrition Therapy:  Appt start time: 0900 end time:  920.  Assessment:  Primary concerns today: Karen Robles is here today for a follow up for obesity and pre diabetes. Gained 7 lbs since last visit. Since last visit made changes such as cutting back on her junk food, cut back on liquid such as Power Ade. No longer interested in looking into bariatric surgery at this time.    Wt Readings from Last 3 Encounters:  06/21/13 277 lb 12.8 oz (126.009 kg)  05/25/13 277 lb (125.646 kg)  04/12/13 270 lb 14.4 oz (122.879 kg)   Ht Readings from Last 3 Encounters:  06/21/13 5\' 4"  (1.626 m)  05/25/13 5\' 4"  (1.626 m)  04/12/13 5\' 4"  (1.626 m)   Body mass index is 47.66 kg/(m^2). @BMIFA @ Normalized weight-for-age data available only for age 3 to 20 years. Normalized stature-for-age data available only for age 3 to 20 years.   Preferred Learning Style:   No preference indicated   Learning Readiness:   Ready  MEDICATIONS:  See list   DIETARY INTAKE:  Avoided foods include dark greens, lima beans, or peas.    24-hr recall:  B ( AM): beef sausage and eggs with jelly and 2 pieces of toast with orange juice Snk ( AM): none  L (2:30 PM): popcorn chicken or chicken tenders with less ginger ale Snk ( PM): none D ( PM): cheeseburger and fries or chicken, pinto beans or "grab something" Snk ( PM): none Beverages: ginger ale Or gatorade/powerade some water  Usual physical activity: none besides working  Estimated energy needs: 1800 calories 200 g carbohydrates 135 g protein 50 g fat  Progress Towards Goal(s):  In progress.   Nutritional Diagnosis:  [place]-3.3 Overweight/obesity As related to history of energy dense food choices and lack of physical activity.  As evidenced by BMI of 46.5.    Intervention:  Nutrition counseling provided.    Plan: Fill half of your plate with vegetable (broccoli, green beans or salad). Aim to get some exercise with a goal of 30 minutes most day days.  Plan to walk on Saturday and Sunday for at least 30 minutes.  Try to just eat something eat something light at night before you go to bed instead of a heavy meal after work.  Try to drink beverages that have no or very few calories (water, G2, Crystal Light).  For snacks, have some fruit with protein (chicken, cheese (1 oz), nuts).  Teaching Method Utilized:  Visual Auditory Hands on   Barriers to learning/adherence to lifestyle change: limited income  Demonstrated degree of understanding via:  Teach Back   Monitoring/Evaluation:  Dietary intake, exercise, and body weight in 2 month(s).

## 2013-06-21 NOTE — Patient Instructions (Addendum)
Fill half of your plate with vegetable (broccoli, green beans or salad). Aim to get some exercise with a goal of 30 minutes most day days. Plan to walk on Saturday and Sunday for at least 30 minutes.  Try to just eat something eat something light at night before you go to bed instead of a heavy meal after work.  Try to drink beverages that have no or very few calories (water, G2, Crystal Light).  For snacks, have some fruit with protein (chicken, cheese (1 oz), nuts).

## 2013-06-27 ENCOUNTER — Other Ambulatory Visit: Payer: Self-pay | Admitting: *Deleted

## 2013-06-27 DIAGNOSIS — B373 Candidiasis of vulva and vagina: Secondary | ICD-10-CM

## 2013-06-27 DIAGNOSIS — B3731 Acute candidiasis of vulva and vagina: Secondary | ICD-10-CM

## 2013-06-27 MED ORDER — FLUCONAZOLE 150 MG PO TABS: 150.0000 mg | ORAL_TABLET | Freq: Once | ORAL | Status: DC

## 2013-07-07 ENCOUNTER — Emergency Department (HOSPITAL_COMMUNITY): Payer: Medicaid Other

## 2013-07-07 ENCOUNTER — Emergency Department (HOSPITAL_COMMUNITY)
Admission: EM | Admit: 2013-07-07 | Discharge: 2013-07-07 | Disposition: A | Discharge status: Home / Self Care | Payer: Medicaid Other | Attending: Emergency Medicine | Admitting: Emergency Medicine

## 2013-07-07 ENCOUNTER — Encounter (HOSPITAL_COMMUNITY): Payer: Self-pay | Admitting: Emergency Medicine

## 2013-07-07 DIAGNOSIS — S92352A Displaced fracture of fifth metatarsal bone, left foot, initial encounter for closed fracture: Secondary | ICD-10-CM

## 2013-07-07 DIAGNOSIS — I2699 Other pulmonary embolism without acute cor pulmonale: Secondary | ICD-10-CM | POA: Insufficient documentation

## 2013-07-07 DIAGNOSIS — E669 Obesity, unspecified: Secondary | ICD-10-CM | POA: Insufficient documentation

## 2013-07-07 DIAGNOSIS — I10 Essential (primary) hypertension: Secondary | ICD-10-CM | POA: Insufficient documentation

## 2013-07-07 DIAGNOSIS — Y990 Civilian activity done for income or pay: Secondary | ICD-10-CM | POA: Insufficient documentation

## 2013-07-07 DIAGNOSIS — Y929 Unspecified place or not applicable: Secondary | ICD-10-CM | POA: Insufficient documentation

## 2013-07-07 DIAGNOSIS — Y93E5 Activity, floor mopping and cleaning: Secondary | ICD-10-CM | POA: Insufficient documentation

## 2013-07-07 DIAGNOSIS — Z3202 Encounter for pregnancy test, result negative: Secondary | ICD-10-CM | POA: Insufficient documentation

## 2013-07-07 DIAGNOSIS — R55 Syncope and collapse: Secondary | ICD-10-CM | POA: Insufficient documentation

## 2013-07-07 DIAGNOSIS — S92309A Fracture of unspecified metatarsal bone(s), unspecified foot, initial encounter for closed fracture: Secondary | ICD-10-CM | POA: Insufficient documentation

## 2013-07-07 DIAGNOSIS — R296 Repeated falls: Secondary | ICD-10-CM | POA: Insufficient documentation

## 2013-07-07 DIAGNOSIS — R42 Dizziness and giddiness: Secondary | ICD-10-CM | POA: Insufficient documentation

## 2013-07-07 DIAGNOSIS — Z79899 Other long term (current) drug therapy: Secondary | ICD-10-CM | POA: Insufficient documentation

## 2013-07-07 DIAGNOSIS — R000 Tachycardia, unspecified: Secondary | ICD-10-CM | POA: Insufficient documentation

## 2013-07-07 DIAGNOSIS — S0990XA Unspecified injury of head, initial encounter: Secondary | ICD-10-CM | POA: Insufficient documentation

## 2013-07-07 LAB — CBC WITH DIFFERENTIAL/PLATELET
Basophils Absolute: 0 10*3/uL (ref 0.0–0.1)
Basophils Relative: 0 % (ref 0–1)
EOS ABS: 0.2 10*3/uL (ref 0.0–0.7)
Eosinophils Relative: 2 % (ref 0–5)
HCT: 38.4 % (ref 36.0–46.0)
Hemoglobin: 13.2 g/dL (ref 12.0–15.0)
Lymphocytes Relative: 34 % (ref 12–46)
Lymphs Abs: 4.1 10*3/uL — ABNORMAL HIGH (ref 0.7–4.0)
MCH: 28.4 pg (ref 26.0–34.0)
MCHC: 34.4 g/dL (ref 30.0–36.0)
MCV: 82.6 fL (ref 78.0–100.0)
Monocytes Absolute: 0.5 10*3/uL (ref 0.1–1.0)
Monocytes Relative: 4 % (ref 3–12)
NEUTROS PCT: 60 % (ref 43–77)
Neutro Abs: 7.2 10*3/uL (ref 1.7–7.7)
PLATELETS: 378 10*3/uL (ref 150–400)
RBC: 4.65 MIL/uL (ref 3.87–5.11)
RDW: 13.5 % (ref 11.5–15.5)
WBC: 12 10*3/uL — ABNORMAL HIGH (ref 4.0–10.5)

## 2013-07-07 LAB — URINALYSIS, ROUTINE W REFLEX MICROSCOPIC
Bilirubin Urine: NEGATIVE
Glucose, UA: NEGATIVE mg/dL
HGB URINE DIPSTICK: NEGATIVE
Ketones, ur: NEGATIVE mg/dL
Nitrite: NEGATIVE
Protein, ur: NEGATIVE mg/dL
SPECIFIC GRAVITY, URINE: 1.01 (ref 1.005–1.030)
UROBILINOGEN UA: 0.2 mg/dL (ref 0.0–1.0)
pH: 6.5 (ref 5.0–8.0)

## 2013-07-07 LAB — URINE MICROSCOPIC-ADD ON

## 2013-07-07 LAB — I-STAT TROPONIN, ED: Troponin i, poc: 0 ng/mL (ref 0.00–0.08)

## 2013-07-07 LAB — COMPREHENSIVE METABOLIC PANEL
ALBUMIN: 3.5 g/dL (ref 3.5–5.2)
ALT: 16 U/L (ref 0–35)
AST: 18 U/L (ref 0–37)
Alkaline Phosphatase: 88 U/L (ref 39–117)
BUN: 12 mg/dL (ref 6–23)
CO2: 25 mEq/L (ref 19–32)
Calcium: 9.3 mg/dL (ref 8.4–10.5)
Chloride: 101 mEq/L (ref 96–112)
Creatinine, Ser: 0.98 mg/dL (ref 0.50–1.10)
GFR calc Af Amer: 89 mL/min — ABNORMAL LOW (ref >90)
GFR calc non Af Amer: 77 mL/min — ABNORMAL LOW (ref >90)
Glucose, Bld: 94 mg/dL (ref 70–99)
Potassium: 3.5 mEq/L — ABNORMAL LOW (ref 3.7–5.3)
SODIUM: 140 meq/L (ref 137–147)
TOTAL PROTEIN: 8.1 g/dL (ref 6.0–8.3)
Total Bilirubin: 0.3 mg/dL (ref 0.3–1.2)

## 2013-07-07 LAB — PREGNANCY, URINE: Preg Test, Ur: NEGATIVE

## 2013-07-07 LAB — D-DIMER, QUANTITATIVE: D-Dimer, Quant: 0.89 ug/mL-FEU — ABNORMAL HIGH (ref 0.00–0.48)

## 2013-07-07 MED ORDER — HYDROCODONE-ACETAMINOPHEN 5-325 MG PO TABS: 1.0000 | ORAL_TABLET | Freq: Four times a day (QID) | ORAL | Status: DC | PRN

## 2013-07-07 MED ORDER — XARELTO VTE STARTER PACK 15 & 20 MG PO TBPK: 15.0000 mg | ORAL_TABLET | ORAL | Status: DC

## 2013-07-07 MED ORDER — IOHEXOL 350 MG/ML SOLN
100.0000 mL | Freq: Once | INTRAVENOUS | Status: AC | PRN
  Administered 2013-07-07: 100 mL via INTRAVENOUS

## 2013-07-07 MED ORDER — SODIUM CHLORIDE 0.9 % IV BOLUS (SEPSIS)
1000.0000 mL | Freq: Once | INTRAVENOUS | Status: AC
Start: 2013-07-07 — End: 2013-07-07
  Administered 2013-07-07: 1000 mL via INTRAVENOUS

## 2013-07-07 MED ORDER — IBUPROFEN 800 MG PO TABS: 800.0000 mg | ORAL_TABLET | Freq: Three times a day (TID) | ORAL | Status: DC

## 2013-07-07 MED ORDER — RIVAROXABAN 15 MG PO TABS
15.0000 mg | ORAL_TABLET | Freq: Once | ORAL | Status: AC
  Administered 2013-07-07: 15 mg via ORAL
  Filled 2013-07-07 (×2): qty 1

## 2013-07-07 NOTE — Discharge Instructions (Signed)
Keep your leg elevated. Use crutches as needed. Cam walker when ambulating. Ice your foot several times a day. Ibuprofen for pain. norco for severe pain. Follow up with orthopedics specialist.   Start xarelto daily. You already received dose for today. Return to ER tomorrow - dont check in- ask to speak with Case manager, they will provide you with a voucher for a discount on this medication- otherwise it is very expensive. Please find and follow up with a primary care doctor.    Metatarsal Fracture, Undisplaced A metatarsal fracture is a break in the bone(s) of the foot. These are the bones of the foot that connect your toes to the bones of the ankle. DIAGNOSIS  The diagnoses of these fractures are usually made with X-rays. If there are problems in the forefoot and x-rays are normal a later bone scan will usually make the diagnosis.  TREATMENT AND HOME CARE INSTRUCTIONS  Treatment may or may not include a cast or walking shoe. When casts are needed the use is usually for short periods of time so as not to slow down healing with muscle wasting (atrophy).  Activities should be stopped until further advised by your caregiver.  Wear shoes with adequate shock absorbing capabilities and stiff soles.  Alternative exercise may be undertaken while waiting for healing. These may include bicycling and swimming, or as your caregiver suggests.  It is important to keep all follow-up visits or specialty referrals. The failure to keep these appointments could result in improper bone healing and chronic pain or disability.  Warning: Do not drive a car or operate a motor vehicle until your caregiver specifically tells you it is safe to do so. IF YOU DO NOT HAVE A CAST OR SPLINT:  You may walk on your injured foot as tolerated or advised.  Do not put any weight on your injured foot for as long as directed by your caregiver. Slowly increase the amount of time you walk on the foot as the pain allows or as  advised.  Use crutches until you can bear weight without pain. A gradual increase in weight bearing may help.  Apply ice to the injury for 15-20 minutes each hour while awake for the first 2 days. Put the ice in a plastic bag and place a towel between the bag of ice and your skin.  Only take over-the-counter or prescription medicines for pain, discomfort, or fever as directed by your caregiver. SEEK IMMEDIATE MEDICAL CARE IF:   Your cast gets damaged or breaks.  You have continued severe pain or more swelling than you did before the cast was put on, or the pain is not controlled with medications.  Your skin or nails below the injury turn blue or grey, or feel cold or numb.  There is a bad smell, or new stains or pus-like (purulent) drainage coming from the cast. MAKE SURE YOU:   Understand these instructions.  Will watch your condition.  Will get help right away if you are not doing well or get worse. Document Released: 12/13/2001 Document Revised: 06/15/2011 Document Reviewed: 11/04/2007 [facility] Patient Information 2014 [place], [place].   Pulmonary Embolus A pulmonary (lung) embolus (PE) is a blood clot that has traveled from another place in the body to the lung. Most clots come from deep veins in the legs or pelvis. PE is a dangerous and potentially life-threatening condition that can be treated if identified. CAUSES Blood clots form in a vein for different reasons. Usually several things cause blood  clots. They include:  The flow of blood slows down.  The inside of the vein is damaged in some way.  The person has a condition that makes the blood clot more easily. These conditions may include:  Older age (especially over 30 years old).  Having a history of blood clots.  Having major or lengthy surgery. Hip surgery is particularly high-risk.  Breaking a hip or leg.  Sitting or lying still for a long time.  Cancer or cancer treatment.  Having a long, thin tube  (catheter) placed inside a vein during a medical procedure.  Being overweight (obese).  Pregnancy and childbirth.  Medicines with estrogen.  Smoking.  Other circulation or heart problems. SYMPTOMS  The symptoms of a PE usually start suddenly and include:  Shortness of breath.  Coughing.  Coughing up blood or blood-tinged mucus (phlegm).  Chest pain. Pain is often worse with deep breaths.  Rapid heartbeat. DIAGNOSIS  If a PE is suspected, your caregiver will take a medical history and carry out a physical exam. Your caregiver will check for the risk factors listed above. Tests that also may be required include:  Blood tests, including studies of the clotting properties of your blood.  Imaging tests. Ultrasound, CT, MRI, and other tests can all be used to see if you have clots in your legs or lungs. If you have a clot in your legs and have breathing or chest problems, your caregiver may conclude that you have a clot in your lungs. Further lung tests may not be needed.  Electrocardiography can look for heart strain from blood clots in the lungs. PREVENTION   Exercise the legs regularly. Take a brisk 30 minute walk every day.  Maintain a weight that is appropriate for your height.  Avoid sitting or lying in bed for long periods of time without moving your legs.  Women, particularly those over the age of 20, should consider the risks and benefits of taking estrogen medicines, including birth control pills.  Do not smoke, especially if you take estrogen medicines.  Long-distance travel can increase your risk. You should exercise your legs by walking or pumping the muscles every hour.  In hospital prevention:  Your caregiver will assess your need for preventive PE care (prophylaxis) when you are admitted to the hospital. If you are having surgery, your surgeon will assess you the day of or day after surgery.  Prevention may include medical and nonmedical measures. TREATMENT    The most common treatment for a PE is blood thinning (anticoagulant) medicine, which reduces the blood's tendency to clot. Anticoagulants can stop new blood clots from forming and old ones from growing. They cannot dissolve existing clots. Your body does this by itself over time. Anticoagulants can be given by mouth, by intravenous (IV) access, or by injection. Your caregiver will determine the best program for you.  Less commonly, clot-dissolving drugs (thrombolytics) are used to dissolve a PE. They carry a high risk of bleeding, so they are used mainly in severe cases.  Very rarely, a blood clot in the leg needs to be removed surgically.  If you are unable to take anticoagulants, your caregiver may arrange for you to have a filter placed in a main vein in your abdomen. This filter prevents clots from traveling to your lungs. HOME CARE INSTRUCTIONS   Take all medicines prescribed by your caregiver. Follow the directions carefully.  Warfarin. Most people will continue taking warfarin after hospital discharge. Your caregiver will advise you on  the length of treatment (usually 3 6 months, sometimes lifelong).  Too much and too little warfarin are both dangerous. Too much warfarin increases the risk of bleeding. Too little warfarin continues to allow the risk for blood clots. While taking warfarin, you will need to have regular blood tests to measure your blood clotting time. These blood tests usually include both the prothrombin time (PT) and International Normalized Ratio (INR) tests. The PT and INR results allow your caregiver to adjust your dose of warfarin. The dose can change for many reasons. It is critically important that you take warfarin exactly as prescribed, and that you have your PT and INR levels drawn exactly as directed.  Many foods, especially foods high in vitamin K can interfere with warfarin and affect the PT and INR results. Foods high in vitamin K include spinach, kale,  broccoli, cabbage, collard and turnip greens, brussels sprouts, peas, cauliflower, seaweed, and parsley as well as beef and pork liver, green tea, and soybean oil. You should eat a consistent amount of foods high in vitamin K. Avoid major changes in your diet, or notify your caregiver before changing your diet. Arrange a visit with a dietitian to answer your questions.  Many medicines can interfere with warfarin and affect the PT and INR results. You must tell your caregiver about any and all medicines you take, this includes all vitamins and supplements. Be especially cautious with aspirin and anti-inflammatory medicines. Ask your caregiver before taking these. Do not take or discontinue any prescribed or over-the-counter medicine except on the advice of your caregiver or pharmacist.  Warfarin can have side effects, such as excessive bruising or bleeding. You will need to hold pressure over cuts for longer than usual.  Alcohol can change the body's ability to handle warfarin. It is best to avoid alcoholic drinks or consume only very small amounts while taking warfarin. Notify your caregiver if you change your alcohol intake.  Notify your dentist or other caregivers before procedures.  Avoid contact sports.  Wear a medical alert bracelet or carry a medical alert card.  Ask your caregiver how soon you can go back to normal activities. Not being active can lead to new clots. Ask for a list of what you should and should not do.  Compression stockings. These are tight elastic stockings that apply pressure to the lower legs. This can help keep the blood in the legs from clotting. You may need to wear compressions stockings at home to help prevent clots.  Smoking. If you smoke, quit. Ask your caregiver for help with quitting smoking.  Learn as much as you can about PE. Educating yourself can help prevent PE from reoccurring. SEEK MEDICAL CARE IF:   You notice a rapid heartbeat.  You feel weaker or  more tired than usual.  You feel faint.  You notice increased bruising.  Your symptoms are not getting better in the time expected.  You are having side effects of medicine. SEEK IMMEDIATE MEDICAL CARE IF:   You have chest pain.  You have trouble breathing.  You have new or increased swelling or pain in one leg.  You cough up blood.  You notice blood in vomit, in a bowel movement, or in urine.  You have an oral temperature above 102 F (38.9 C), not controlled by medicine. You may have another PE. A blood clot in the lungs is a medical emergency. Call your local emergency services (911 in [place] to get to the nearest hospital or clinic.  Do not drive yourself. MAKE SURE YOU:   Understand these instructions.  Will watch your condition.  Will get help right away if you are not doing well or get worse. Document Released: 03/20/2000 Document Revised: 09/22/2011 Document Reviewed: 09/24/2008 [facility] Patient Information 2014 [place], [place].

## 2013-07-07 NOTE — ED Notes (Signed)
Notified CT that pt is able to come to CT now.

## 2013-07-07 NOTE — ED Notes (Addendum)
Pt in stating she had a syncopal episode and fell last night at work, states she was mopping and woke up on the floor, states she blacked out and did not slip prior to fall, c/o headache earlier in the night prior to incident- c/o pain to left ankle at this time- pt denies hitting head last night, states she landed on her arm

## 2013-07-07 NOTE — ED Provider Notes (Signed)
CSN: [id number]     Arrival date & time 07/07/13  1400 History   First MD Initiated Contact with Patient 07/07/13 1536     Chief Complaint  Patient presents with  . Fall  . Loss of Consciousness     (Consider location/radiation/quality/duration/timing/severity/associated sxs/prior Treatment) HPI Karen Robles is a 33 y.o. female who presents to ED with complaint of a syncopal episode yesterday. Pt states she was at work, mopping the floor when she remembers getting dizzy and then waking up on the floor. Patient states that evening she had a headache. She took Goody's powder. She denies feeling any chest pain or palpitations prior to the syncopal episode. She states when she woke up on the ground, she does not remember having headache, does not remember feeling dizzy, but had pain in left ankle. Patient states she was able to get up and drive home. She denies any dizziness, lightheadedness, headache, chest pain, shortness of breath since the episode. Her only complaint today is ankle pain. Patient denies any prior cardiac or any major medical problems. She states her diagnosis of hypertension for which she takes medications. She denies any recent changes in medication doses. She denies any recent n/v/d or any other reasons for dehydration. She denies any swelling or pain in LE other than pain in her injured foot and ankle. No other complains.   Past Medical History  Diagnosis Date  . Obesity   . Hypertension    Past Surgical History  Procedure Laterality Date  . Mouth surgery    . Wisdom tooth extraction Bilateral    Family History  Problem Relation Age of Onset  . Hypertension Mother     maternal aunts & uncles  . Cancer Neg Hx   . Diabetes Neg Hx   . Heart disease Neg Hx   . Healthy Brother    History  Substance Use Topics  . Smoking status: Never Smoker   . Smokeless tobacco: Never Used  . Alcohol Use: No   OB History   Grav Para Term Preterm Abortions TAB SAB Ect Mult  Living   2 2 2             Review of Systems  Constitutional: Negative for fever and chills.  Respiratory: Negative for cough, chest tightness and shortness of breath.   Cardiovascular: Negative for chest pain, palpitations and leg swelling.  Gastrointestinal: Negative for nausea, vomiting, abdominal pain and diarrhea.  Genitourinary: Negative for dysuria, flank pain, vaginal bleeding, vaginal discharge, vaginal pain and pelvic pain.  Musculoskeletal: Positive for arthralgias and joint swelling. Negative for myalgias, neck pain and neck stiffness.  Skin: Negative for rash.  Neurological: Positive for dizziness, syncope, light-headedness and headaches. Negative for speech difficulty, weakness and numbness.  All other systems reviewed and are negative.      Allergies  Review of patient's allergies indicates no known allergies.  Home Medications   Current Outpatient Rx  Name  Route  Sig  Dispense  Refill  . Aspirin-Acetaminophen-Caffeine (GOODY HEADACHE PO)   Oral   Take 1 packet by mouth daily as needed (headache).         . medroxyPROGESTERone (DEPO-PROVERA) 150 MG/ML injection   Intramuscular   Inject 1 mL (150 mg total) into the muscle every 3 (three) months.   1 mL   3   . triamterene-hydrochlorothiazide (DYAZIDE) 37.5-25 MG per capsule   Oral   Take 1 each (1 capsule total) by mouth daily.   1 capsule  11    BP 144/108  Pulse 115  Temp(Src) 97.9 F (36.6 C) (Oral)  Resp 20  Wt 274 lb (124.286 kg)  SpO2 100% Physical Exam  Nursing note and vitals reviewed. Constitutional: She is oriented to person, place, and time. She appears well-developed and well-nourished. No distress.  HENT:  Head: Normocephalic.  Eyes: Conjunctivae and EOM are normal. Pupils are equal, round, and reactive to light.  Neck: Normal range of motion. Neck supple.  Cardiovascular: Normal rate, regular rhythm and normal heart sounds.   Pulmonary/Chest: Effort normal and breath sounds  normal. No respiratory distress. She has no wheezes. She has no rales.  Abdominal: Soft. Bowel sounds are normal. She exhibits no distension. There is no tenderness. There is no rebound.  Musculoskeletal: She exhibits no edema.  Mild swelling to the left ankle and foot. Tender over base of 5th metatarsal. Pain with 5th toe rom at MTP joint. Otherwise negative   Neurological: She is alert and oriented to person, place, and time. No cranial nerve deficit. Coordination normal.  5/5 and equal upper and lower extremity strength bilaterally. Equal grip strength bilaterally. Normal finger to nose and heel to shin. No pronator drift.   Skin: Skin is warm and dry.  Psychiatric: She has a normal mood and affect. Her behavior is normal.    ED Course  Procedures (including critical care time) Labs Review Labs Reviewed  CBC WITH DIFFERENTIAL - Abnormal; Notable for the following:    WBC 12.0 (*)    Lymphs Abs 4.1 (*)    All other components within normal limits  COMPREHENSIVE METABOLIC PANEL - Abnormal; Notable for the following:    Potassium 3.5 (*)    GFR calc non Af Amer 77 (*)    GFR calc Af Amer 89 (*)    All other components within normal limits  D-DIMER, QUANTITATIVE  I-STAT TROPOININ, ED   Imaging Review Dg Ankle Complete Left  07/07/2013   CLINICAL DATA:  Fall.  Left ankle pain and swelling.  EXAM: LEFT ANKLE COMPLETE - 3+ VIEW  COMPARISON:  None.  FINDINGS: There is no evidence of fracture, dislocation, or joint effusion. There is no evidence of arthropathy or other focal bone abnormality. Soft tissues are unremarkable.  IMPRESSION: Negative.   Electronically Signed   By: Myles Rosenthal M.D.   On: 07/07/2013 14:39     EKG Interpretation   Date/Time:  Friday July 07 2013 14:06:46 EDT Ventricular Rate:  114 PR Interval:  122 QRS Duration: 86 QT Interval:  316 QTC Calculation: 435 R Axis:   67 Text Interpretation:  Sinus tachycardia Otherwise normal ECG no previous  for comparison  Confirmed by HARRISON  MD, FORREST ([id number]) on 07/07/2013  3:45:11 PM      MDM   Final diagnoses:  Pulmonary embolism  Fracture of fifth metatarsal bone of left foot    Patient with a syncopal episode last night, currently asymptomatic other than ankle pain. She is persistently tachycardic. EKG showing sinus tachycardia. She is not orthostatic. D-dimer and lab work obtained.  D-dimer elevated will proceed with a CT angio  8:32 PM Spoke with Burna Mortimer, case Production designer, theatre/television/film. Advised to give dose of xarelto here in ED. Home with prescription but she will have to come back tomorrow to get a voucher for the program for the rest of the prescriptions.   Crutches and cam walker and first dose of xarelto ordered.   9:19 PM Pt ambulated in the hallway. Remained asymptomatic for sob. No  dizziness. Will plan to d/c home with xarelto. Pain medications for the foot - norco 20tab. Follow up with primary care doctor and orthopedics. Pt is to return tomorrow to get voucher for xarelto.   Filed Vitals:   07/07/13 1940 07/07/13 2000 07/07/13 2130 07/07/13 2200  BP:  131/89 116/72 131/85  Pulse: 91 95 104 103  Temp:      TempSrc:      Resp: 15 21 19 16   Weight:      SpO2: 97% 95% 97% 95%     Myriam Jacobsonatyana A Vannary Greening, PA-C 07/08/13 0103

## 2013-07-07 NOTE — Progress Notes (Signed)
Orthopedic Tech Progress Note Patient Details:  Karen Robles [birth year] [id number]  Ortho Devices Type of Ortho Device: Crutches;CAM walker Ortho Device/Splint Location: lle Ortho Device/Splint Interventions: Application   [name] 07/07/2013, 9:06 PM

## 2013-07-07 NOTE — Consult Note (Signed)
[facility] Initial Consult Note  Karen Robles  NFA:[id number]  DOB: 1980/06/09  DOA: 07/07/2013 DOS: the patient was seen and examined on 07/07/2013   Referring physician: Junius ArgyleForrest S Harrison, MD, Lottie Musselatyana A Kirichenko, PA-C  PCP: No PCP Per Patient   Reason for consult: Fall, near syncope  HPI: Karen Robles is a 33 y.o. female with Past medical history of hypertension and obesity. The patient presented with complaints of near syncope yesterday. She mentions she was working, mopping floor, yesterday, and then suddenly become dizzy and had a fall. She thinks she may have passed out for a second but she remembers the whole fall and hitting the ground. She denies any head injury or neck injury. She had injury in her leg. She denies any incontinence of bowel or bladder or tongue bite. It was not witnessed. She denies any prior similar episodes, recurrent dizziness, recurrent syncope. She denies any recent change in her medications. She mentions she is compliant with her medications. She is on Dyazide 1 tablet a day for high blood pressure. She denies any chest pain, chest tightness, palpitation at the time of my evaluation. She had one episode of fluttering last week without any dizziness shortness of breath chest pain. She denies any fever, chills, nausea, vomiting, abdominal pain, diarrhea, constipation, burning urination, focal neurological deficit, blurring of vision, difficulty with speech, difficulty with balance. She came to the hospital today because of a fall she started having further worsening of left foot pain that started after the fall with some swelling of the left foot. She denies any family history of bleeding disorder family history of clotting disorder. She is trying to lose weight following dietitian.   Review of Systems: as mentioned in the history of present illness.  A Comprehensive review of the other systems is negative.  Past Medical History  Diagnosis  Date  . Obesity   . Hypertension    Past Surgical History  Procedure Laterality Date  . Mouth surgery    . Wisdom tooth extraction Bilateral    Social History:  reports that she has never smoked. She has never used smokeless tobacco. She reports that she does not drink alcohol or use illicit drugs. Patient is coming from home  No Known Allergies  Family History  Problem Relation Age of Onset  . Hypertension Mother     maternal aunts & uncles  . Cancer Neg Hx   . Diabetes Neg Hx   . Heart disease Neg Hx   . Healthy Brother     Prior to Admission medications   Medication Sig Start Date End Date Taking? Authorizing Provider  Aspirin-Acetaminophen-Caffeine (GOODY HEADACHE PO) Take 1 packet by mouth daily as needed (headache).   Yes Historical Provider, MD  medroxyPROGESTERone (DEPO-PROVERA) 150 MG/ML injection Inject 1 mL (150 mg total) into the muscle every 3 (three) months. 12/19/12  Yes Antionette CharLisa Jackson-Moore, MD  triamterene-hydrochlorothiazide (DYAZIDE) 37.5-25 MG per capsule Take 1 each (1 capsule total) by mouth daily. 03/07/13  Yes Brock Badharles A Harper, MD  HYDROcodone-acetaminophen (NORCO) 5-325 MG per tablet Take 1 tablet by mouth every 6 (six) hours as needed for moderate pain. 07/07/13   Tatyana A Kirichenko, PA-C  ibuprofen (ADVIL,MOTRIN) 800 MG tablet Take 1 tablet (800 mg total) by mouth 3 (three) times daily. 07/07/13   Tatyana A Kirichenko, PA-C  XARELTO STARTER PACK 15 & 20 MG TBPK Take 15-20 mg by mouth as directed. Take as directed on package: Start with one 15mg  tablet by  mouth twice a day with food. On Day 22, switch to one 20mg  tablet once a day with food. 07/07/13   Lottie Mussel, PA-C    Physical Exam: Filed Vitals:   07/07/13 1556 07/07/13 1800 07/07/13 1939 07/07/13 1940  BP:  130/88 136/88   Pulse: 105 94  91  Temp: 98.1 F (36.7 C)     TempSrc: Oral     Resp:  20  15  Weight:      SpO2: 99% 95%  97%   Patient walked out in the hallway, denied any symptoms  of shortness of breath chest pain palpitation or dizziness. Blood pressure 130/94 after standing for 2 minutes. Heart rate 100. Oxygenation 92% on room air.  General: Alert, Awake and Oriented to Time, Place and Person. Appear in no distress Eyes: PERRL ENT: Oral Mucosa clear, moist geographic tongue, Neck: No  JVD, no  Carotid Bruits  Cardiovascular: S1 and S2 Present, no Murmur, Peripheral Pulses Present Respiratory: Bilateral Air entry equal and Decreased, Clear to Auscultation,  No  Crackles,no  wheezes Abdomen: Bowel Sound Present, Soft and Non tender  Skin: No  Rash Extremities: No  Pedal edema, no  calf tenderness, left foot tenderness  Neurologic: Grossly Unremarkable.  Labs:  Basic Metabolic Panel:  Recent Labs Lab 07/07/13 1415  NA 140  K 3.5*  CL 101  CO2 25  GLUCOSE 94  BUN 12  CREATININE 0.98  CALCIUM 9.3   Liver Function Tests:  Recent Labs Lab 07/07/13 1415  AST 18  ALT 16  ALKPHOS 88  BILITOT 0.3  PROT 8.1  ALBUMIN 3.5   CBC:  Recent Labs Lab 07/07/13 1415  WBC 12.0*  NEUTROABS 7.2  HGB 13.2  HCT 38.4  MCV 82.6  PLT 378   Radiological Exams: Dg Ankle Complete Left  07/07/2013   CLINICAL DATA:  Fall.  Left ankle pain and swelling.  EXAM: LEFT ANKLE COMPLETE - 3+ VIEW  COMPARISON:  None.  FINDINGS: There is no evidence of fracture, dislocation, or joint effusion. There is no evidence of arthropathy or other focal bone abnormality. Soft tissues are unremarkable.  IMPRESSION: Negative.   Electronically Signed   By: Myles Rosenthal M.D.   On: 07/07/2013 14:39   Ct Head Wo Contrast  07/07/2013   CLINICAL DATA:  Pain with loss of consciousness post trauma  EXAM: CT HEAD WITHOUT CONTRAST  TECHNIQUE: Contiguous axial images were obtained from the base of the skull through the vertex without intravenous contrast.  COMPARISON:  None.  FINDINGS: The ventricles are normal in size and configuration. There is no mass, hemorrhage, extra-axial fluid collection, or  midline shift. The gray-white compartments are normal. Bony calvarium appears intact. The mastoid air cells are clear.  IMPRESSION: Study within normal limits.   Electronically Signed   By: Bretta Bang M.D.   On: 07/07/2013 16:37   Ct Angio Chest W/cm &/or Wo Cm  07/07/2013   CLINICAL DATA:  Pain with recent trauma  EXAM: CT ANGIOGRAPHY CHEST WITH CONTRAST  TECHNIQUE: Multidetector CT imaging of the chest was performed using the standard protocol during bolus administration of intravenous contrast. Multiplanar CT image reconstructions and MIPs were obtained to evaluate the vascular anatomy.  CONTRAST:  OMNIPAQUE IOHEXOL 350 MG/ML SOLN  COMPARISON:  None.  FINDINGS: There are several small nonobstructing pulmonary emboli in the left lower lobe pulmonary artery region. There is no major vessel pulmonary embolus. There is no thoracic aortic aneurysm or dissection.  The  lungs are clear.  No pneumothorax or lung contusion.  There is no appreciable thoracic adenopathy. Pericardium is not thickened.  Visualized upper abdominal structures appear normal. There are no blastic or lytic bone lesions. No evidence of fracture. Visualized thyroid appears normal.  Review of the MIP images confirms the above findings.  IMPRESSION: Several small left lower lobe pulmonary artery pulmonary emboli, nonobstructing. No lung edema or consolidation.  Critical Value/emergent results were called by telephone at the time of interpretation on 07/07/2013 at 7:42 PM to [facility] , who verbally acknowledged these results.   Electronically Signed   By: Bretta Bang M.D.   On: 07/07/2013 19:42   Dg Foot Complete Left  07/07/2013   CLINICAL DATA:  Pain post trauma  EXAM: LEFT FOOT - COMPLETE 3+ VIEW  COMPARISON:  None.  FINDINGS: Frontal, oblique, and lateral views were obtained. There is an avulsion type fracture along the proximal most aspect of the fifth proximal metatarsal. Alignment is essentially anatomic. No other  fracture. No dislocation. Joint spaces appear intact. There is slight hallux valgus deformity at the first MTP joint.  IMPRESSION: Fracture proximal aspect fifth metatarsal.   Electronically Signed   By: Bretta Bang M.D.   On: 07/07/2013 16:29    EKG: Independently reviewed. Sinus tachycardia without any evidence of acute ischemia  Assessment/Plan 1. subsegmental small PE  The patient has a small subsegmental PE. she is not hypoxic, not hypotensive, does not have any complaints of chest pain, shortness of breath at rest as well as exertional. Patient was given options for warfarin versus Xarelto and she preferred Xarelto as she would like to go home. She does not have any contraindications to Xarelto and is willing to take Xarelto. She does an episode of near syncope that is one isolated event and at present she does not have any recurrent symptoms of dizziness lightheadedness or orthostatic hypotension or arrhythmia on telemetry last 5 hours. She does not have any significant abnormality on her labs troponins are negative EKG does not show acute ischemia, CT head negative. Thus she does not have any indication for acute inpatient admission.  She has good family support and is agreeing to will see the PCP in one week. She prefers to remain at home. With this I believe that the patient is a reasonable candidate for outpatient therapy. Patient is given instructions to return to the hospital or Er if she develops further worsening of shortness of breath, chest pain, palpitation, active bleeding, focal neurological deficit, dizziness or syncope. she lives 10 minutes away from the hospital. Patient will be given prescriptions for Xarelto from ED for PE. She returned to the ED for voucher for Xarelto.  She will also receive a follow up with orthopedics for her first metatarsal fracture. Down the road she may require further workup for hereditary hypercoagulability, lower extremity duplex,  chronic shortness of breath.  Patient was handed over back to the ED physician who will perform the discharge. opportunity was given to ask question and all questions were answered satisfactorily at the time of interview.   Author: Lynden Oxford, MD [facility] Pager: [phone] 07/07/2013 9:21 PM    If 7PM-7AM, please contact night-coverage [email or web address] Password [email or web address]

## 2013-07-08 LAB — URINE CULTURE: Colony Count: 75000

## 2013-07-08 NOTE — ED Provider Notes (Signed)
Medical screening examination/treatment/procedure(s) were performed by non-physician practitioner and as supervising physician I was immediately available for consultation/collaboration.   EKG Interpretation   Date/Time:  Friday July 07 2013 14:06:46 EDT Ventricular Rate:  114 PR Interval:  122 QRS Duration: 86 QT Interval:  316 QTC Calculation: 435 R Axis:   67 Text Interpretation:  Sinus tachycardia Otherwise normal ECG no previous  for comparison Confirmed by Nevena Rozenberg  MD, Amy Belloso ([id number]) on 07/07/2013  3:45:11 PM        Randa SpikeForrest Mort SawyersS Dashonna Chagnon, MD 07/08/13 1231

## 2013-07-08 NOTE — Progress Notes (Addendum)
On call  ED CM received call  Last night  4/3 regarding medication assistance for Xarelto anticoagulant. [facility] near syncope diagnosed with PE. Noted Pt insured by Medicaid. ED CM contacted patient regarding medication assistance and confirmed Medicaid coverage patient also, voiced not having a PCP. Offered assistance  with finding a PCP. Discussed the importance of having a PCP for f/u. Provided the names of some Providers who accepts medicaid. Pt verbalize [facility] interest in [facility].   Discussed Xarelto is covered by Medicaid.  Discussed the Xarelto 30 day free  trial offer and the activation process.  Offered to activate card, instructions on how to  redeem card at any retail pharmacy, Pt verbalized appreciation  for the assistance. Pt verbalized understanding and agrees with the plan. Card was activated. [facility]6 to schedule appt for f/u, pt made aware scheduler from clinic will contact her with appt.Pt verbalized understanding used the teach back method. Pt states, [facility]to [facility] this morning. Will notify [employer] Energy[employer]ana Kirichenko PA-C.  No further CM needs identified

## 2013-07-26 ENCOUNTER — Ambulatory Visit: Payer: Medicaid Other | Attending: Cardiology | Admitting: Cardiology

## 2013-07-26 ENCOUNTER — Encounter: Payer: Self-pay | Admitting: Cardiology

## 2013-07-26 VITALS — BP 133/87 | HR 98 | Temp 97.8°F | Resp 18 | Ht 64.0 in | Wt 282.0 lb

## 2013-07-26 DIAGNOSIS — I10 Essential (primary) hypertension: Secondary | ICD-10-CM

## 2013-07-26 DIAGNOSIS — I2699 Other pulmonary embolism without acute cor pulmonale: Secondary | ICD-10-CM | POA: Insufficient documentation

## 2013-07-26 DIAGNOSIS — Z7901 Long term (current) use of anticoagulants: Secondary | ICD-10-CM | POA: Insufficient documentation

## 2013-07-26 DIAGNOSIS — Z79899 Other long term (current) drug therapy: Secondary | ICD-10-CM | POA: Insufficient documentation

## 2013-07-26 DIAGNOSIS — Z6841 Body Mass Index (BMI) 40.0 and over, adult: Secondary | ICD-10-CM | POA: Insufficient documentation

## 2013-07-26 NOTE — Progress Notes (Signed)
HPI [name] comes in today for followup after being admitted with near-syncope. He was found to have several pulmonary emboli in her left lower lung. She is on anticoagulation starter pack with Xarelto and is being very compliant. She is just about ready to switch over to once a day therapy.  Her risk factors for clot are mostly morbid obesity and immobility. She does not smoke. She is on Depo progesterone. She is not taking estrogen. She has a history of hypertension.  Remarkably, she has had no chest discomfort or shortness of breath. She denies hemoptysis or cough.  Past Medical History  Diagnosis Date  . Obesity   . Hypertension     Current Outpatient Prescriptions  Medication Sig Dispense Refill  . HYDROcodone-acetaminophen (NORCO) 5-325 MG per tablet Take 1 tablet by mouth every 6 (six) hours as needed for moderate pain.  20 tablet  0  . ibuprofen (ADVIL,MOTRIN) 800 MG tablet Take 1 tablet (800 mg total) by mouth 3 (three) times daily.  21 tablet  0  . triamterene-hydrochlorothiazide (DYAZIDE) 37.5-25 MG per capsule Take 1 each (1 capsule total) by mouth daily.  1 capsule  11  . XARELTO STARTER PACK 15 & 20 MG TBPK Take 15-20 mg by mouth as directed. Take as directed on package: Start with one 15mg  tablet by mouth twice a day with food. On Day 22, switch to one 20mg  tablet once a day with food.  51 each  0  . Aspirin-Acetaminophen-Caffeine (GOODY HEADACHE PO) Take 1 packet by mouth daily as needed (headache).      . medroxyPROGESTERone (DEPO-PROVERA) 150 MG/ML injection Inject 1 mL (150 mg total) into the muscle every 3 (three) months.  1 mL  3   Current Facility-Administered Medications  Medication Dose Route Frequency Provider Last Rate Last Dose  . medroxyPROGESTERone (DEPO-PROVERA) injection 150 mg  150 mg Intramuscular Q90 days Brock Badharles A Harper, MD   150 mg at 05/25/13 1215    No Known Allergies  Family History  Problem Relation Age of Onset  . Hypertension Mother    maternal aunts & uncles  . Cancer Neg Hx   . Diabetes Neg Hx   . Heart disease Neg Hx   . Healthy Brother     History   Social History  . Marital Status: Single    Spouse Name: N/A    Number of Children: 2  . Years of Education: N/A   Occupational History  . Unemployed    Social History Main Topics  . Smoking status: Never Smoker   . Smokeless tobacco: Never Used  . Alcohol Use: No  . Drug Use: No  . Sexual Activity: Yes    Partners: Male    Birth Control/ Protection: Injection   Other Topics Concern  . Not on file   Social History Narrative  . No narrative on file    ROS ALL NEGATIVE EXCEPT THOSE NOTED IN HPI  PE  General Appearance: well developed, well nourished in no acute distress, morbidly obese HEENT: symmetrical face, PERRLA, good dentition  Neck: no JVD, thyromegaly, or adenopathy, trachea midline Chest: symmetric without deformity Cardiac: PMI non-displaced, RRR, normal S1, S2, no gallop or murmur Lung: clear to ausculation and percussion Vascular: all pulses full without bruits  Abdominal: nondistended, nontender, good bowel sounds, n Extremities: no cyanosis, clubbing or edema, no sign of DVT, no varicosities  Skin: normal color, no rashes Neuro: alert and oriented x 3, non-focal Pysch: normal affect  EKG  BMET  Component Value Date/Time   NA 140 07/07/2013 1415   K 3.5* 07/07/2013 1415   CL 101 07/07/2013 1415   CO2 25 07/07/2013 1415   GLUCOSE 94 07/07/2013 1415   BUN 12 07/07/2013 1415   CREATININE 0.98 07/07/2013 1415   CALCIUM 9.3 07/07/2013 1415   GFRNONAA 77* 07/07/2013 1415   GFRAA 89* 07/07/2013 1415    Lipid Panel     Component Value Date/Time   CHOL 135 02/08/2013 1408   HDL 39* 02/08/2013 1408    CBC    Component Value Date/Time   WBC 12.0* 07/07/2013 1415   RBC 4.65 07/07/2013 1415   HGB 13.2 07/07/2013 1415   HCT 38.4 07/07/2013 1415   PLT 378 07/07/2013 1415   MCV 82.6 07/07/2013 1415   MCH 28.4 07/07/2013 1415   MCHC 34.4 07/07/2013 1415    RDW 13.5 07/07/2013 1415   LYMPHSABS 4.1* 07/07/2013 1415   MONOABS 0.5 07/07/2013 1415   EOSABS 0.2 07/07/2013 1415   BASOSABS 0.0 07/07/2013 1415

## 2013-07-26 NOTE — Assessment & Plan Note (Signed)
I've encouraged weight loss and exercise, walking daily. I educated her about the link of obesity and immobility with risk of recurrent DVT and pulmonary embolus.

## 2013-07-26 NOTE — Patient Instructions (Signed)
Pt instructed to start taking Xeralto 20 mg tab BID daily

## 2013-07-26 NOTE — Assessment & Plan Note (Signed)
We'll continue with anticoagulation for 3 months. I've reviewed the change from twice a day Xarelto to 20 mg once a day with the starter packet that she brought with her. I'll see her back in 3 months.

## 2013-07-26 NOTE — Progress Notes (Signed)
Pt here HFU blood clot in left lower lung, sent home on Xeralto 15mg /20mg  dose pack Pt is taking 15 mg bid  Denies CP,sob with exertion

## 2013-07-28 ENCOUNTER — Telehealth: Payer: Self-pay | Admitting: Internal Medicine

## 2013-07-28 NOTE — Telephone Encounter (Signed)
Pt is calling in to request a doctor's note for her visit with Dr. Daleen SquibbWall on 4/22; Pt needs to be excused as well as be cleared to continue working as normal; [phone] [employer] Attn: Ms. Kizzie BaneHughes

## 2013-07-31 ENCOUNTER — Telehealth: Payer: Self-pay | Admitting: Internal Medicine

## 2013-07-31 NOTE — Telephone Encounter (Signed)
Pt called regarding a note she had asked form the nurse last week Friday to be faxed over to another location, pt states that she has called the other location to see if they have received the paperwork but they have told her that they have not received it. Pt would like to know if the note was faxed over? Please contact pt

## 2013-08-03 NOTE — Telephone Encounter (Signed)
pts form has been faxed and received. Pt stated that she was in the ED and she is having nausea and dry heaving. I asked her to come in tomorrow so she could assessed. I instructed her that if she gets any worse then to go to the ED or Urgent care.

## 2013-08-07 ENCOUNTER — Other Ambulatory Visit: Payer: Self-pay | Admitting: *Deleted

## 2013-08-07 ENCOUNTER — Other Ambulatory Visit: Payer: Self-pay | Admitting: Internal Medicine

## 2013-08-07 DIAGNOSIS — I82409 Acute embolism and thrombosis of unspecified deep veins of unspecified lower extremity: Secondary | ICD-10-CM

## 2013-08-07 MED ORDER — RIVAROXABAN 20 MG PO TABS: 20.0000 mg | ORAL_TABLET | Freq: Every day | ORAL | Status: DC

## 2013-08-07 NOTE — Telephone Encounter (Signed)
Patient called in for a refill on Xarelto. Informed patient that we will refill for one month until she comes in on 09/05/2013. Patient verbalized understanding. Reather LaurenceJamie R Avnoor Koury, RN

## 2013-08-23 ENCOUNTER — Ambulatory Visit (INDEPENDENT_AMBULATORY_CARE_PROVIDER_SITE_OTHER): Payer: Medicaid Other | Admitting: *Deleted

## 2013-08-23 ENCOUNTER — Ambulatory Visit: Payer: Medicaid Other | Admitting: Dietician

## 2013-08-23 VITALS — BP 117/78 | HR 88 | Temp 97.8°F | Wt 286.0 lb

## 2013-08-23 DIAGNOSIS — Z3042 Encounter for surveillance of injectable contraceptive: Secondary | ICD-10-CM

## 2013-08-23 DIAGNOSIS — Z3049 Encounter for surveillance of other contraceptives: Secondary | ICD-10-CM

## 2013-08-23 NOTE — Progress Notes (Signed)
Pt is in office today for depo injection.  Pt is on time for injection.  Injection given in right deltoid.  Pt tolerated well.  Pt advised to RTO office on 11/14/13 for next injection.  Pt states no other conerns today.

## 2013-09-05 ENCOUNTER — Ambulatory Visit: Payer: Medicaid Other | Attending: Internal Medicine | Admitting: Internal Medicine

## 2013-09-05 ENCOUNTER — Encounter: Payer: Self-pay | Admitting: Internal Medicine

## 2013-09-05 ENCOUNTER — Other Ambulatory Visit: Payer: Self-pay | Admitting: Internal Medicine

## 2013-09-05 VITALS — BP 137/95 | HR 100 | Temp 98.4°F | Resp 17 | Wt 283.0 lb

## 2013-09-05 DIAGNOSIS — Z86711 Personal history of pulmonary embolism: Secondary | ICD-10-CM | POA: Diagnosis not present

## 2013-09-05 DIAGNOSIS — E669 Obesity, unspecified: Secondary | ICD-10-CM | POA: Diagnosis not present

## 2013-09-05 DIAGNOSIS — Z139 Encounter for screening, unspecified: Secondary | ICD-10-CM

## 2013-09-05 DIAGNOSIS — Z7901 Long term (current) use of anticoagulants: Secondary | ICD-10-CM | POA: Insufficient documentation

## 2013-09-05 DIAGNOSIS — I10 Essential (primary) hypertension: Secondary | ICD-10-CM | POA: Diagnosis present

## 2013-09-05 DIAGNOSIS — I2699 Other pulmonary embolism without acute cor pulmonale: Secondary | ICD-10-CM

## 2013-09-05 LAB — COMPLETE METABOLIC PANEL WITH GFR
ALBUMIN: 3.7 g/dL (ref 3.5–5.2)
ALT: 17 U/L (ref 0–35)
AST: 17 U/L (ref 0–37)
Alkaline Phosphatase: 84 U/L (ref 39–117)
BUN: 17 mg/dL (ref 6–23)
CALCIUM: 9.1 mg/dL (ref 8.4–10.5)
CO2: 26 mEq/L (ref 19–32)
Chloride: 101 mEq/L (ref 96–112)
Creat: 1.05 mg/dL (ref 0.50–1.10)
GFR, Est African American: 82 mL/min
GFR, Est Non African American: 71 mL/min
Glucose, Bld: 88 mg/dL (ref 70–99)
POTASSIUM: 3.6 meq/L (ref 3.5–5.3)
Sodium: 136 mEq/L (ref 135–145)
Total Bilirubin: 0.3 mg/dL (ref 0.2–1.2)
Total Protein: 7.7 g/dL (ref 6.0–8.3)

## 2013-09-05 LAB — CBC WITH DIFFERENTIAL/PLATELET
BASOS ABS: 0 10*3/uL (ref 0.0–0.1)
Basophils Relative: 0 % (ref 0–1)
Eosinophils Absolute: 0.1 10*3/uL (ref 0.0–0.7)
Eosinophils Relative: 1 % (ref 0–5)
HEMATOCRIT: 39.8 % (ref 36.0–46.0)
Hemoglobin: 13.4 g/dL (ref 12.0–15.0)
LYMPHS PCT: 41 % (ref 12–46)
Lymphs Abs: 3.9 10*3/uL (ref 0.7–4.0)
MCH: 26.8 pg (ref 26.0–34.0)
MCHC: 33.7 g/dL (ref 30.0–36.0)
MCV: 79.6 fL (ref 78.0–100.0)
MONO ABS: 0.4 10*3/uL (ref 0.1–1.0)
Monocytes Relative: 4 % (ref 3–12)
NEUTROS ABS: 5.2 10*3/uL (ref 1.7–7.7)
NEUTROS PCT: 54 % (ref 43–77)
PLATELETS: 428 10*3/uL — AB (ref 150–400)
RBC: 5 MIL/uL (ref 3.87–5.11)
RDW: 13.8 % (ref 11.5–15.5)
WBC: 9.6 10*3/uL (ref 4.0–10.5)

## 2013-09-05 LAB — LIPID PANEL
CHOL/HDL RATIO: 3.9 ratio
Cholesterol: 143 mg/dL (ref 0–200)
HDL: 37 mg/dL — AB (ref >39)
LDL Cholesterol: 82 mg/dL (ref 0–99)
Triglycerides: 120 mg/dL (ref <150)
VLDL: 24 mg/dL (ref 0–40)

## 2013-09-05 MED ORDER — RIVAROXABAN 20 MG PO TABS: 20.0000 mg | ORAL_TABLET | Freq: Every day | ORAL | Status: DC

## 2013-09-05 NOTE — Progress Notes (Signed)
Patient here to establish care Has history of blood clot Currently on xarelto

## 2013-09-05 NOTE — Progress Notes (Signed)
Patient Demographics  Karen Robles, is a 33 y.o. female  GYI:[id number]  JJK:[id number]  DOB - September 14, 1980  CC:  Chief Complaint  Patient presents with  . Establish Care       HPI: Karen Robles is a 33 y.o. female here today to establish medical care. She has history of pulmonary embolism and is taking Xarelto, patient is following up with Dr. wall, patient also has history of hypertension and is taking Dyazide, denies any headache dizziness chest and shortness of breath.  Patient has No headache, No chest pain, No abdominal pain - No Nausea, No new weakness tingling or numbness, No Cough - SOB.  No Known Allergies Past Medical History  Diagnosis Date  . Obesity   . Hypertension    Current Outpatient Prescriptions on File Prior to Visit  Medication Sig Dispense Refill  . HYDROcodone-acetaminophen (NORCO) 5-325 MG per tablet Take 1 tablet by mouth every 6 (six) hours as needed for moderate pain.  20 tablet  0  . medroxyPROGESTERone (DEPO-PROVERA) 150 MG/ML injection Inject 1 mL (150 mg total) into the muscle every 3 (three) months.  1 mL  3  . triamterene-hydrochlorothiazide (DYAZIDE) 37.5-25 MG per capsule Take 1 each (1 capsule total) by mouth daily.  1 capsule  11   No current facility-administered medications on file prior to visit.   Family History  Problem Relation Age of Onset  . Hypertension Mother     maternal aunts & uncles  . Diabetes Neg Hx   . Heart disease Neg Hx   . Healthy Brother   . Hypertension Maternal Aunt   . Hypertension Maternal Uncle   . Cancer Maternal Grandmother    History   Social History  . Marital Status: Single    Spouse Name: N/A    Number of Children: 2  . Years of Education: N/A   Occupational History  . Unemployed    Social History Main Topics  . Smoking status: Never Smoker   . Smokeless tobacco: Never Used  . Alcohol Use: No  . Drug Use: No  . Sexual Activity: Yes    Partners: Male    Birth Control/  Protection: Injection   Other Topics Concern  . Not on file   Social History Narrative  . No narrative on file    Review of Systems: Constitutional: Negative for fever, chills, diaphoresis, activity change, appetite change and fatigue. HENT: Negative for ear pain, nosebleeds, congestion, facial swelling, rhinorrhea, neck pain, neck stiffness and ear discharge.  Eyes: Negative for pain, discharge, redness, itching and visual disturbance. Respiratory: Negative for cough, choking, chest tightness, shortness of breath, wheezing and stridor.  Cardiovascular: Negative for chest pain, palpitations and leg swelling. Gastrointestinal: Negative for abdominal distention. Genitourinary: Negative for dysuria, urgency, frequency, hematuria, flank pain, decreased urine volume, difficulty urinating and dyspareunia.  Musculoskeletal: Negative for back pain, joint swelling, arthralgia and gait problem. Neurological: Negative for dizziness, tremors, seizures, syncope, facial asymmetry, speech difficulty, weakness, light-headedness, numbness and headaches.  Hematological: Negative for adenopathy. Does not bruise/bleed easily. Psychiatric/Behavioral: Negative for hallucinations, behavioral problems, confusion, dysphoric mood, decreased concentration and agitation.    Objective:   Filed Vitals:   09/05/13 1030  BP: 137/95  Pulse: 100  Temp: 98.4 F (36.9 C)  Resp: 17    Physical Exam: Constitutional: Obese female sitting comfortably not in acute distress. HENT: Normocephalic, atraumatic, External right and left ear normal. Oropharynx is clear and moist.  Eyes: Conjunctivae and EOM are normal. PERRLA,  no scleral icterus. Neck: Normal ROM. Neck supple. No JVD. No tracheal deviation. No thyromegaly. CVS: RRR, S1/S2 +, no murmurs, no gallops, no carotid bruit.  Pulmonary: Effort and breath sounds normal, no stridor, rhonchi, wheezes, rales.  Abdominal: Soft. BS +, no distension, tenderness, rebound or  guarding.  Musculoskeletal: Normal range of motion. No edema and no tenderness.  Neuro: Alert. Normal reflexes, muscle tone coordination. No cranial nerve deficit. Skin: Skin is warm and dry. No rash noted. Not diaphoretic. No erythema. No pallor. Psychiatric: Normal mood and affect. Behavior, judgment, thought content normal.  Lab Results  Component Value Date   WBC 12.0* 07/07/2013   HGB 13.2 07/07/2013   HCT 38.4 07/07/2013   MCV 82.6 07/07/2013   PLT 378 07/07/2013   Lab Results  Component Value Date   CREATININE 0.98 07/07/2013   BUN 12 07/07/2013   NA 140 07/07/2013   K 3.5* 07/07/2013   CL 101 07/07/2013   CO2 25 07/07/2013    Lab Results  Component Value Date   HGBA1C 5.7* 02/08/2013   Lipid Panel     Component Value Date/Time   CHOL 135 02/08/2013 1408   HDL 39* 02/08/2013 1408       Assessment and plan:   1. Essential hypertension, benign Have advised patient for DASH diet continue with Dyazide will check blood chemistry.  2. Pulmonary embolism Continue with Xarelto. - rivaroxaban (XARELTO) 20 MG TABS tablet; Take 1 tablet (20 mg total) by mouth daily with supper.  Dispense: 30 tablet; Refill: 0  3. Screening Baseline blood work  - CBC with Differential - COMPLETE METABOLIC PANEL WITH GFR - TSH - Lipid panel - Vit D  25 hydroxy (rtn osteoporosis monitoring)  4. Obesity, unspecified Advise for diet and exercise.   Return in about 3 months (around 12/06/2013) for Pulmonary embolism, hypertension.   Doris Cheadleeepak Jaleiyah Alas, MD

## 2013-09-06 LAB — VITAMIN D 25 HYDROXY (VIT D DEFICIENCY, FRACTURES): Vit D, 25-Hydroxy: 30 ng/mL (ref 30–89)

## 2013-09-06 LAB — TSH: TSH: 1.662 u[IU]/mL (ref 0.350–4.500)

## 2013-09-13 ENCOUNTER — Ambulatory Visit: Payer: Medicaid Other | Admitting: Dietician

## 2013-10-04 ENCOUNTER — Other Ambulatory Visit: Payer: Self-pay | Admitting: Internal Medicine

## 2013-10-04 DIAGNOSIS — I2699 Other pulmonary embolism without acute cor pulmonale: Secondary | ICD-10-CM

## 2013-10-07 ENCOUNTER — Emergency Department (HOSPITAL_COMMUNITY)
Admission: EM | Admit: 2013-10-07 | Discharge: 2013-10-07 | Disposition: A | Discharge status: Home / Self Care | Payer: Medicaid Other | Attending: Emergency Medicine | Admitting: Emergency Medicine

## 2013-10-07 ENCOUNTER — Encounter (HOSPITAL_COMMUNITY): Payer: Self-pay | Admitting: Emergency Medicine

## 2013-10-07 DIAGNOSIS — N938 Other specified abnormal uterine and vaginal bleeding: Secondary | ICD-10-CM | POA: Diagnosis not present

## 2013-10-07 DIAGNOSIS — I10 Essential (primary) hypertension: Secondary | ICD-10-CM | POA: Diagnosis not present

## 2013-10-07 DIAGNOSIS — N939 Abnormal uterine and vaginal bleeding, unspecified: Secondary | ICD-10-CM

## 2013-10-07 DIAGNOSIS — Z86711 Personal history of pulmonary embolism: Secondary | ICD-10-CM | POA: Insufficient documentation

## 2013-10-07 DIAGNOSIS — Z79899 Other long term (current) drug therapy: Secondary | ICD-10-CM | POA: Insufficient documentation

## 2013-10-07 DIAGNOSIS — R000 Tachycardia, unspecified: Secondary | ICD-10-CM | POA: Diagnosis not present

## 2013-10-07 DIAGNOSIS — N949 Unspecified condition associated with female genital organs and menstrual cycle: Secondary | ICD-10-CM | POA: Insufficient documentation

## 2013-10-07 DIAGNOSIS — E669 Obesity, unspecified: Secondary | ICD-10-CM | POA: Diagnosis not present

## 2013-10-07 DIAGNOSIS — N925 Other specified irregular menstruation: Secondary | ICD-10-CM | POA: Diagnosis not present

## 2013-10-07 DIAGNOSIS — R319 Hematuria, unspecified: Secondary | ICD-10-CM | POA: Diagnosis present

## 2013-10-07 DIAGNOSIS — N898 Other specified noninflammatory disorders of vagina: Secondary | ICD-10-CM | POA: Diagnosis not present

## 2013-10-07 DIAGNOSIS — Z3202 Encounter for pregnancy test, result negative: Secondary | ICD-10-CM | POA: Diagnosis not present

## 2013-10-07 HISTORY — DX: Other pulmonary embolism without acute cor pulmonale: I26.99

## 2013-10-07 LAB — URINALYSIS, ROUTINE W REFLEX MICROSCOPIC
Bilirubin Urine: NEGATIVE
Glucose, UA: NEGATIVE mg/dL
Ketones, ur: NEGATIVE mg/dL
Nitrite: NEGATIVE
Protein, ur: 30 mg/dL — AB
SPECIFIC GRAVITY, URINE: 1.023 (ref 1.005–1.030)
Urobilinogen, UA: 0.2 mg/dL (ref 0.0–1.0)
pH: 6.5 (ref 5.0–8.0)

## 2013-10-07 LAB — URINE MICROSCOPIC-ADD ON

## 2013-10-07 LAB — PREGNANCY, URINE: Preg Test, Ur: NEGATIVE

## 2013-10-07 LAB — WET PREP, GENITAL
Clue Cells Wet Prep HPF POC: NONE SEEN
Trich, Wet Prep: NONE SEEN
YEAST WET PREP: NONE SEEN

## 2013-10-07 NOTE — ED Provider Notes (Signed)
CSN: [id number]     Arrival date & time 10/07/13  1316 History   First MD Initiated Contact with Patient 10/07/13 1339     Chief Complaint  Patient presents with  . Hematuria     (Consider location/radiation/quality/duration/timing/severity/associated sxs/prior Treatment) HPI Comments: 33 year old female presents to the emergency department complaining of hematuria x2 days. Patient reports she had vaginal intercourse with her partner 3 nights ago, and the next day she noticed she had a small amount of bright red blood in her urine and on the toilet paper. Later in the day she noticed more blood in her urine and on the toilet paper. Earlier this morning there was no blood in her urine, however symptoms returned this afternoon. She is unsure if the blood is coming from her urine or her vagina. Denies increased urinary frequency, urgency or dysuria. She is not sure when her last menstrual cycle was because she is on the Depo shot. Denies dyspareunia. She is sexually active with one partner for the past 4 years. Denies fever or chills.  Patient is a 33 y.o. female presenting with hematuria. The history is provided by the patient.  Hematuria    Past Medical History  Diagnosis Date  . Obesity   . Hypertension   . Pulmonary embolism    Past Surgical History  Procedure Laterality Date  . Mouth surgery    . Wisdom tooth extraction Bilateral    Family History  Problem Relation Age of Onset  . Hypertension Mother     maternal aunts & uncles  . Diabetes Neg Hx   . Heart disease Neg Hx   . Healthy Brother   . Hypertension Maternal Aunt   . Hypertension Maternal Uncle   . Cancer Maternal Grandmother    History  Substance Use Topics  . Smoking status: Never Smoker   . Smokeless tobacco: Never Used  . Alcohol Use: No   OB History   Grav Para Term Preterm Abortions TAB SAB Ect Mult Living   2 2 2             Review of Systems  Genitourinary: Positive for hematuria and vaginal  bleeding.  All other systems reviewed and are negative.     Allergies  Review of patient's allergies indicates no known allergies.  Home Medications   Prior to Admission medications   Medication Sig Start Date End Date Taking? Authorizing Provider  medroxyPROGESTERone (DEPO-PROVERA) 150 MG/ML injection Inject 1 mL (150 mg total) into the muscle every 3 (three) months. 12/19/12  Yes Antionette CharLisa Jackson-Moore, MD  rivaroxaban (XARELTO) 20 MG TABS tablet Take 1 tablet (20 mg total) by mouth daily with supper. 09/05/13  Yes Doris Cheadleeepak Advani, MD  triamterene-hydrochlorothiazide (DYAZIDE) 37.5-25 MG per capsule Take 1 each (1 capsule total) by mouth daily. 03/07/13  Yes Brock Badharles A Harper, MD   BP 116/77  Pulse 114  Temp(Src) 98.6 F (37 C) (Oral)  Resp 16  SpO2 97% Physical Exam  Nursing note and vitals reviewed. Constitutional: She is oriented to person, place, and time. She appears well-developed and well-nourished. No distress.  Obese.  HENT:  Head: Normocephalic and atraumatic.  Mouth/Throat: Oropharynx is clear and moist.  Eyes: Conjunctivae are normal.  Neck: Normal range of motion. Neck supple.  Cardiovascular: Regular rhythm and normal heart sounds.  Tachycardia present.   Pulmonary/Chest: Effort normal and breath sounds normal.  Abdominal: Soft. Bowel sounds are normal. There is no tenderness.  Genitourinary: Uterus normal. Cervix exhibits no motion tenderness, no discharge  and no friability. Right adnexum displays no mass, no tenderness and no fullness. Left adnexum displays no mass, no tenderness and no fullness. Vaginal discharge (clear with dark red/brown tinge) found.  Vaginal bleeding, dark brown/red consistent with menstrual blood.  Musculoskeletal: Normal range of motion. She exhibits no edema.  Neurological: She is alert and oriented to person, place, and time.  Skin: Skin is warm and dry. She is not diaphoretic.  Psychiatric: She has a normal mood and affect. Her behavior is  normal.    ED Course  Procedures (including critical care time) Labs Review Labs Reviewed  WET PREP, GENITAL - Abnormal; Notable for the following:    WBC, Wet Prep HPF POC FEW (*)    All other components within normal limits  URINALYSIS, ROUTINE W REFLEX MICROSCOPIC - Abnormal; Notable for the following:    Color, Urine AMBER (*)    APPearance CLOUDY (*)    Hgb urine dipstick LARGE (*)    Protein, ur 30 (*)    Leukocytes, UA SMALL (*)    All other components within normal limits  URINE MICROSCOPIC-ADD ON - Abnormal; Notable for the following:    Squamous Epithelial / LPF FEW (*)    Bacteria, UA FEW (*)    All other components within normal limits  GC/CHLAMYDIA PROBE AMP  PREGNANCY, URINE    Imaging Review No results found.   EKG Interpretation None      MDM   Final diagnoses:  Vaginal bleeding   Patient presenting with hematuria. She has well appearing and in no apparent distress. Tachycardic, vitals otherwise stable. No associated abdominal, urinary complaints. On pelvic exam, patient has dark brown/red discharge consistent with menstrual blood. Wet prep without any acute findings. Urine with 7-10 WBC, few bacteria, few squamous cells. Asymptomatic. No tx at this time, urine culture pending. Patient states she has never had her menstrual cycle on the depo-shot. I advised her to followup with her gynecologist. Stable for discharge. Return precautions given. Patient states understanding of treatment care plan and is agreeable.   Trevor MaceRobyn M Albert, PA-C 10/07/13 [phone]

## 2013-10-07 NOTE — Discharge Instructions (Signed)
Follow up with your gynecologist.  Abnormal Uterine Bleeding Abnormal uterine bleeding can affect women at various stages in life, including teenagers, women in their reproductive years, pregnant women, and women who have reached menopause. Several kinds of uterine bleeding are considered abnormal, including:  Bleeding or spotting between periods.   Bleeding after sexual intercourse.   Bleeding that is heavier or more than normal.   Periods that last longer than usual.  Bleeding after menopause.  Many cases of abnormal uterine bleeding are minor and simple to treat, while others are more serious. Any type of abnormal bleeding should be evaluated by your health care provider. Treatment will depend on the cause of the bleeding. HOME CARE INSTRUCTIONS Monitor your condition for any changes. The following actions may help to alleviate any discomfort you are experiencing:  Avoid the use of tampons and douches as directed by your health care provider.  Change your pads frequently. You should get regular pelvic exams and Pap tests. Keep all follow-up appointments for diagnostic tests as directed by your health care provider.  SEEK MEDICAL CARE IF:   Your bleeding lasts more than 1 week.   You feel dizzy at times.  SEEK IMMEDIATE MEDICAL CARE IF:   You pass out.   You are changing pads every 15 to 30 minutes.   You have abdominal pain.  You have a fever.   You become sweaty or weak.   You are passing large blood clots from the vagina.   You start to feel nauseous and vomit. MAKE SURE YOU:   Understand these instructions.  Will watch your condition.  Will get help right away if you are not doing well or get worse. Document Released: 03/23/2005 Document Revised: 03/28/2013 Document Reviewed: 10/20/2012 [facility] Patient Information 2015 [place]. This information is not intended to replace advice given to you by your health care provider. Make sure you discuss  any questions you have with your health care provider.  Menstruation Menstruation is the monthly passing of blood, tissue, fluid and mucus, also know as a period. Your body is shedding the lining of the uterus. The flow, or amount of blood, usually lasts from 3-7 days each month. Hormones control the menstrual cycle. Hormones are a chemical substance produced by endocrine glands in the body to regulate different bodily functions. The first menstrual period may start any time between age 73 years to 16 years. However, it usually starts around age 31 years. Some girls have regular monthly menstrual cycles right from the beginning. However, it is not unusual to have only a couple of drops of blood or spotting when you first start menstruating. It is also not unusual to have two periods a month or miss a month or two when first starting your periods. SYMPTOMS   Mild to moderate abdominal cramps.  Aching or pain in the lower back area. Symptoms may occur 5-10 days before your menstrual period starts. These symptoms are referred to as premenstrual syndrome (PMS). These symptoms can include:  Headache.  Breast tenderness and swelling.  Bloating.  Tiredness (fatigue).  Mood changes.  Craving for certain foods. These are normal signs and symptoms and can vary in severity. To help relieve these problems, ask your caregiver if you can take over-the-counter medications for pain or discomfort. If the symptoms are not controllable, see your caregiver for help.  HORMONES INVOLVED IN MENSTRUATION Menstruation comes about because of hormones produced by the pituitary gland in the brain and the ovaries that affect the  uterine lining. First, the pituitary gland in the brain produces the hormone follicle stimulating hormone [facility](FSH). FSH stimulates the ovaries to produce estrogen, which thickens the uterine lining and begins to develop an egg in the ovary. About 14 days later, the pituitary gland produces another  hormone called luteinizing hormone (LH). LH causes the egg to come out of a sac in the ovary (ovulation). The empty sac on the ovary called the corpus luteum is stimulated by another hormone from the pituitary gland called luteotropin. The corpus luteum begins to produce the estrogen and progesterone hormone. The progesterone hormone prepares the lining of the uterus to have the fertilized egg (egg combined with sperm) attach to the lining of the uterus and begin to develop into a fetus. If the egg is not fertilized, the corpus luteum stops producing estrogen and progesterone, it disappears, the lining of the uterus sloughs off and a menstrual period begins. Then the menstrual cycle starts all over again and will continue monthly unless pregnancy occurs or menopause begins. The secretion of hormones is complex. Various parts of the body become involved in many chemical activities. Female sex hormones have other functions in a woman's body as well. Estrogen increases a woman's sex drive (libido). It naturally helps body get rid of fluids (diuretic). It also aids in the process of building new bone. Therefore, maintaining hormonal health is essential to all levels of a woman's well being. These hormones are usually present in normal amounts and cause you to menstruate. It is the relationship between the (small) levels of the hormones that is critical. When the balance is upset, menstrual irregularities can occur. HOW DOES THE MENSTRUAL CYCLE HAPPEN?  Menstrual cycles vary in length from 21-35 days with an average of 29 days. The cycle begins on the first day of bleeding. At this time, the pituitary gland in the brain releases FSH that travels through the bloodstream to the ovaries. [facility] stimulates the follicles in the ovaries. This prepares the body for ovulation that occurs around the 14th day of the cycle. The ovaries produce estrogen, and this makes sure conditions are right in the uterus for implantation of  the fertilized egg.  When the levels of estrogen reach a high enough level, it signals the gland in the brain (pituitary gland) to release a surge of LH. This causes the release of the ripest egg from its follicle (ovulation). Usually only one follicle releases one egg, but sometimes more than one follicle releases an egg especially when stimulating the ovaries for in vitro fertilization. The egg can then be collected by either fallopian tube to await fertilization. The burst follicle within the ovary that is left behind is now called the corpus luteum or "yellow body." The corpus luteum continues to give off (secrete) reduced amounts of estrogen. This closes and hardens the cervix. It dries up the mucus to the naturally infertile condition.  The corpus luteum also begins to give off greater amounts of progesterone. This causes the lining of the uterus (endometrium) to thicken even more in preparation for the fertilized egg. The egg is starting to journey down from the fallopian tube to the uterus. It also signals the ovaries to stop releasing eggs. It assists in returning the cervical mucus to its infertile state.  If the egg implants successfully into the womb lining and pregnancy occurs, progesterone levels will continue to raise. It is often this hormone that gives some pregnant women a feeling of well being, like a "natural high."  Progesterone levels drop again after childbirth.  If fertilization does not occur, the corpus luteum dies, stopping the production of hormones. This sudden drop in progesterone causes the uterine lining to break down, accompanied by blood (menstruation).  This starts the cycle back at day 1. The whole process starts all over again. Woman go through this cycle every month from puberty to menopause. Women have breaks only for pregnancy and breastfeeding (lactation), unless the woman has health problems that affect the female hormone system or chooses to use oral contraceptives  to have unnatural menstrual periods. HOME CARE INSTRUCTIONS   Keep track of your periods by using a calendar.  If you use tampons, get the least absorbent to avoid toxic shock syndrome.  Do not leave tampons in the vagina over night or longer than 6 hours.  Wear a sanitary pad over night.  Exercise 3-5 times a week or more.  Avoid foods and drinks that you know will make your symptoms worse before or during your period. SEEK MEDICAL CARE IF:   You develop a fever with your period.  Your periods are lasting more than 7 days.  Your period is so heavy that you have to change pads or tampons every 30 minutes.  You develop clots with your period and never had clots before.  You cannot get relief from over-the-counter medication for your symptoms.  Your period has not started, and it has been longer than 35 days. Document Released: 03/13/2002 Document Revised: 03/28/2013 Document Reviewed: 10/20/2012 [facility] Patient Information 2015 [place], [place]. This information is not intended to replace advice given to you by your health care provider. Make sure you discuss any questions you have with your health care provider.

## 2013-10-07 NOTE — ED Notes (Addendum)
Pt reports having intercourse with boyfriend 2 days ago. Began having blood in urine the morning afterwards. Reports lower abd pain, no flank pain or pain with urination.

## 2013-10-07 NOTE — ED Provider Notes (Signed)
Medical screening examination/treatment/procedure(s) were performed by non-physician practitioner and as supervising physician I was immediately available for consultation/collaboration.   EKG Interpretation None        Layla MawKristen N Kendarrius Tanzi, DO 10/07/13 1513

## 2013-10-08 LAB — URINE CULTURE

## 2013-10-09 LAB — GC/CHLAMYDIA PROBE AMP
CT Probe RNA: NEGATIVE
GC Probe RNA: NEGATIVE

## 2013-10-09 NOTE — Telephone Encounter (Signed)
Spoke with pharmacist xarelto refilled

## 2013-10-11 ENCOUNTER — Ambulatory Visit: Payer: Medicaid Other | Admitting: Dietician

## 2013-10-25 ENCOUNTER — Encounter: Payer: Self-pay | Admitting: Cardiology

## 2013-10-25 ENCOUNTER — Ambulatory Visit: Payer: Medicaid Other | Attending: Cardiology | Admitting: Cardiology

## 2013-10-25 VITALS — BP 128/93 | HR 85 | Temp 97.7°F | Resp 20 | Ht 63.0 in | Wt 282.0 lb

## 2013-10-25 DIAGNOSIS — Z79899 Other long term (current) drug therapy: Secondary | ICD-10-CM | POA: Diagnosis not present

## 2013-10-25 DIAGNOSIS — I2699 Other pulmonary embolism without acute cor pulmonale: Secondary | ICD-10-CM

## 2013-10-25 DIAGNOSIS — I10 Essential (primary) hypertension: Secondary | ICD-10-CM

## 2013-10-25 DIAGNOSIS — Z86711 Personal history of pulmonary embolism: Secondary | ICD-10-CM | POA: Insufficient documentation

## 2013-10-25 MED ORDER — RIVAROXABAN 20 MG PO TABS: 20.0000 mg | ORAL_TABLET | Freq: Every day | ORAL | Status: DC

## 2013-10-25 NOTE — Progress Notes (Signed)
Patient here for Cardiac follow-up.  Patient on Xarelto 20mg  for acute pulmonary embolism. Patient has been on Xarelto since 07/07/13 and indicates she takes all meds as prescribed. Patient denies chest pain or swelling.  Indicates she does have shortness of breath but indicates it is likely due to weight.

## 2013-10-25 NOTE — Assessment & Plan Note (Signed)
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 She has not had an hypercoagulable panel which I will order today. Recent blood work otherwise is normal including TSH. Will continue anticoagulation and see her back in 3 months. I've encouraged her to continue to be active and lose as much weight as possible. Please make sure it is clear that this was not secondary to a injury to her foot. Duration of anticoagulation could be up to one year as I discussed with her today. This will also depend on her hypercoagulable panel. I've discussed this case with Dr. Delford FieldWright of pulmonary medicine. He agrees with this plan.

## 2013-10-25 NOTE — Patient Instructions (Addendum)
Please follow-up with Dr. Daleen SquibbWall in early October. Your Xarelto has been refilled.  It was great seeing you today.  Take care!

## 2013-10-25 NOTE — Progress Notes (Signed)
Ms. Karen Robles returns today for close followup of her history of multiple pulmonary emboli in April. She presented with syncope and shortness of breath. When she fainted, she broke her left foot. Chest CT showed multiple small pulmonary emboli in the left lower lung. EKG showed sinus tachycardia.  She is now been on Xarelto for over 3 months. 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 It should be noted that she was doing this type of activity prior to her pulmonary embolus. Her major risk factor for pulmonary embolus is morbid obesity.  She denies any chest pain or shortness of breath. She's had no bleeding.  Her physical examination is unchanged. Specifically, her lungs are clear without rub. Heart reveals a normal S1-S2 with a normal rate. Extremities reveal no edema and no calf tenderness. Pulses are intact.

## 2013-10-30 LAB — HYPERCOAGULABLE PANEL, COMPREHENSIVE
ANTICARDIOLIPIN IGA: 7 U/mL (ref <22)
ANTICARDIOLIPIN IGG: 9 GPL U/mL (ref <23)
ANTICARDIOLIPIN IGM: 3 [MPL'U]/mL (ref <11)
BETA-2-GLYCOPROTEIN I IGA: 10 A Units (ref <20)
Beta-2 Glyco I IgG: 3 G Units (ref <20)
Beta-2-Glycoprotein I IgM: 8 M Units (ref <20)

## 2013-10-31 ENCOUNTER — Telehealth: Payer: Self-pay | Admitting: Emergency Medicine

## 2013-10-31 DIAGNOSIS — Z0000 Encounter for general adult medical examination without abnormal findings: Secondary | ICD-10-CM

## 2013-10-31 NOTE — Telephone Encounter (Signed)
Spoke with pt to reschedule repeat blood work due to lab error. Pt to come in tomorrow for labs

## 2013-11-01 ENCOUNTER — Ambulatory Visit: Payer: Medicaid Other | Attending: Internal Medicine

## 2013-11-01 DIAGNOSIS — Z0000 Encounter for general adult medical examination without abnormal findings: Secondary | ICD-10-CM

## 2013-11-06 LAB — HYPERCOAGULABLE PANEL, COMPREHENSIVE
AntiThromb III Func: 111 % (ref 76–126)
DRVVT 1 1 MIX: 47.2 s — AB (ref <42.9)
DRVVT: 66.3 secs — ABNORMAL HIGH (ref <42.9)
Drvvt confirmation: 0.94 Ratio (ref <1.15)
Lupus Anticoagulant: NOT DETECTED
PROTEIN C ACTIVITY: 165 % — AB (ref 75–133)
PROTEIN C, TOTAL: 73 % (ref 72–160)
PROTEIN S ACTIVITY: 149 % — AB (ref 69–129)
PROTEIN S TOTAL: 90 % (ref 60–150)
PTT LA: 55.4 s — AB (ref 28.0–43.0)
PTTLA 41 MIX: 53.4 s — AB (ref 28.0–43.0)
PTTLA Confirmation: 0 secs (ref <8.0)

## 2013-11-15 ENCOUNTER — Ambulatory Visit: Payer: Medicaid Other | Admitting: Dietician

## 2013-11-22 ENCOUNTER — Other Ambulatory Visit: Payer: Self-pay | Admitting: *Deleted

## 2013-11-22 ENCOUNTER — Telehealth: Payer: Self-pay | Admitting: Emergency Medicine

## 2013-11-22 DIAGNOSIS — Z3042 Encounter for surveillance of injectable contraceptive: Secondary | ICD-10-CM

## 2013-11-22 MED ORDER — MEDROXYPROGESTERONE ACETATE 150 MG/ML IM SUSP: 150.0000 mg | INTRAMUSCULAR | Status: DC

## 2013-11-22 NOTE — Telephone Encounter (Signed)
Pt given test results with instructions to continue taking prescribed Xeralto. Pt will see Dr. Daleen SquibbWall in 2 mnths for f/u

## 2013-11-22 NOTE — Telephone Encounter (Signed)
Left message to call when message received regarding test results and medication instructions

## 2013-11-22 NOTE — Telephone Encounter (Signed)
Message copied by Darlis LoanSMITH, Neftaly Inzunza D on Wed Nov 22, 2013  3:27 PM ------      Message from: Valera CastleWALL, THOMAS C      Created: Wed Nov 22, 2013  2:01 PM       Based on these results, the patient most likely will need lifetime anticoagulation. I will discuss with her when she comes back in 2 months. Please call her and remind her to stay on her anticoagulation as I stated in my last office visit. ------

## 2013-11-23 ENCOUNTER — Ambulatory Visit (INDEPENDENT_AMBULATORY_CARE_PROVIDER_SITE_OTHER): Payer: Medicaid Other | Admitting: *Deleted

## 2013-11-23 VITALS — BP 128/79 | HR 95 | Temp 97.3°F | Ht 63.0 in | Wt 285.0 lb

## 2013-11-23 DIAGNOSIS — Z3042 Encounter for surveillance of injectable contraceptive: Secondary | ICD-10-CM

## 2013-11-23 DIAGNOSIS — Z3049 Encounter for surveillance of other contraceptives: Secondary | ICD-10-CM

## 2013-11-23 MED ORDER — MEDROXYPROGESTERONE ACETATE 150 MG/ML IM SUSP
150.0000 mg | INTRAMUSCULAR | Status: AC
  Administered 2013-11-23 – 2014-08-07 (×4): 150 mg via INTRAMUSCULAR

## 2013-11-23 NOTE — Progress Notes (Signed)
Patient in office today for a Depo Injection.  Patient tolerated injection well.  Patient due for next injection 02-14-14.    BP 128/79  Pulse 95  Temp(Src) 97.3 F (36.3 C)  Ht 5\' 3"  (1.6 m)  Wt 285 lb (129.275 kg)  BMI 50.50 kg/m2  Administrations This Visit   medroxyPROGESTERone (DEPO-PROVERA) injection 150 mg   Administered Action Dose Route Administered By   11/23/2013 Given 150 mg Intramuscular Shelda PalAndrea Lynn Avonne Berkery, LPN

## 2013-12-27 ENCOUNTER — Encounter: Payer: Medicaid Other | Attending: Internal Medicine | Admitting: Dietician

## 2013-12-27 DIAGNOSIS — Z6841 Body Mass Index (BMI) 40.0 and over, adult: Secondary | ICD-10-CM | POA: Diagnosis not present

## 2013-12-27 DIAGNOSIS — Z713 Dietary counseling and surveillance: Secondary | ICD-10-CM | POA: Diagnosis not present

## 2013-12-27 NOTE — Patient Instructions (Addendum)
Fill half of your plate with vegetable (broccoli, green beans or salad). Aim to get some exercise with a goal of 30 minutes most day days. Plan to walk on Saturday and Sunday for at least 30 minutes.  Try to just eat something eat something light at night before you go to bed instead of a heavy meal after work.  Try to drink beverages that have no or very few calories (water, G2, Crystal Light).  For snacks, have some fruit with protein (chicken, cheese (1 oz), nuts).  -Keep trying to walk more!  -Go walking or go to the park or sit outside when you're feeling depressed  -Be mindful of hunger cues - find something else to do if you are not actually hungry -Try to eat slowly and mindfully -Try to drink more water -Try eating without the TV on

## 2013-12-27 NOTE — Progress Notes (Signed)
  Medical Nutrition Therapy:  Appt start time: 850 end time:  910.  Follow-up: Karen Robles returns today with her daughter, [name], who also has an appointment. [name] reports she recently lost her job and has been depressed and snacking more. She has gained 2 pounds since July. She is drinking mostly ginger ale and Sprite because her requested she limit caffeine due to Xarelto. [name] lives with her 2 daughters, her boyfriend and his brother and 2 sons. [name] reports she has been trying to walk more and tries not to snack late at night. She tried G2 gatorade and [employer] but did not like.   Wt Readings from Last 3 Encounters:  12/27/13 284 lb (128.822 kg)  11/23/13 285 lb (129.275 kg)  10/25/13 282 lb (127.914 kg)   Ht Readings from Last 3 Encounters:  11/23/13  (1.6 m)  10/25/13  (1.6 m)  07/26/13  (1.626 m)   Body mass index is 50.32 kg/(m^2). @ Normalized weight-for-age data available only for age 30 to 20 years. Normalized stature-for-age data available only for age 30 to 20 years.    Wt Readings from Last 3 Encounters:  11/23/13 285 lb (129.275 kg)  10/25/13 282 lb (127.914 kg)  09/05/13 283 lb (128.368 kg)   Ht Readings from Last 3 Encounters:  11/23/13  (1.6 m)  10/25/13  (1.6 m)  07/26/13  (1.626 m)   There is no weight on file to calculate BMI. @ Normalized weight-for-age data available only for age 30 to 20 years. Normalized stature-for-age data available only for age 30 to 20 years.   Preferred Learning Style:   No preference indicated   Learning Readiness:   Not ready  MEDICATIONS:  See list   DIETARY INTAKE:  Avoided foods include dark greens, lima beans, or peas.    24-hr recall:  B ( AM): beef sausage and eggs or grits or oatmeal Snk ( AM): none  L (2:30 PM): sandwich or chicken fries Snk ( PM): small bag of chips D ( PM): chicken or pork chops or hamburger helper Snk ( PM): usually none  Beverages:  ginger ale Or gatorade/powerade some water  Usual physical activity: walking more in general  Estimated energy needs: 1800 calories 200 g carbohydrates 135 g protein 50 g fat  Progress Towards Goal(s):  In progress.   Nutritional Diagnosis:  [place]-3.3 Overweight/obesity As related to history of energy dense food choices and lack of physical activity.  As evidenced by BMI of 46.5.    Intervention:  Nutrition counseling provided.     Teaching Method Utilized:  Auditory  Barriers to learning/adherence to lifestyle change: limited income  Demonstrated degree of understanding via:  Teach Back   Monitoring/Evaluation:  Dietary intake, exercise, and body weight in 4 month(s).

## 2014-01-17 ENCOUNTER — Encounter: Payer: Self-pay | Admitting: Cardiology

## 2014-01-17 ENCOUNTER — Ambulatory Visit: Payer: Medicaid Other | Attending: Cardiology | Admitting: Cardiology

## 2014-01-17 VITALS — BP 111/73 | HR 110 | Temp 98.7°F | Resp 18 | Ht 64.0 in | Wt 288.0 lb

## 2014-01-17 DIAGNOSIS — I10 Essential (primary) hypertension: Secondary | ICD-10-CM

## 2014-01-17 DIAGNOSIS — I2699 Other pulmonary embolism without acute cor pulmonale: Secondary | ICD-10-CM

## 2014-01-17 NOTE — Progress Notes (Signed)
Ms. Karen Robles comes today for followup of her multiple pulmonary emboli in April of this year. She continues on anticoagulation with Xarelto. She is compliant with her medical program. She denies any chest pain or shortness of breath at rest. She does have dyspnea on exertion when she's walking fast or climbing a Hill. She denies hemoptysis. She's had no pain in her legs or edema. She denies any melena or hematochezia. She's had no breakthrough bleeding recently.  Hyper coagulation profile is concerning for predisposition to further clots off of anticoagulation. Please see the results from the panel. I have a call into Dr. Delford FieldWright to discuss the need for lifelong anticoagulation.  Physical examination today reveals her to be in no acute distress. She is morbidly obese. Vital signs are stable. Neck shows no JVD. Lungs are clear with no rubs. Cardiac exam reveals a soft S1-S2. Extremities no edema and no tenderness.

## 2014-01-17 NOTE — Assessment & Plan Note (Signed)
She is 6 months out from acute pulmonary emboli that was not provoked. Her hypercoagulable profile is concerning for predisposition to further emboli the future if off anticoagulation. I discussed this quite openly with her. I am awaiting a callback from Dr. Delford FieldWright and may also consult Dr. Cyndie ChimeGranfortuna for definitive recommendations. Continue anticoagulation for now. Patient is fine with this.

## 2014-01-17 NOTE — Patient Instructions (Signed)
Continue taking Xarelto daily Dr. Daleen SquibbWall will see you in 6 months

## 2014-02-03 ENCOUNTER — Other Ambulatory Visit: Payer: Self-pay | Admitting: Obstetrics

## 2014-02-05 ENCOUNTER — Encounter: Payer: Self-pay | Admitting: Cardiology

## 2014-02-14 ENCOUNTER — Ambulatory Visit (INDEPENDENT_AMBULATORY_CARE_PROVIDER_SITE_OTHER): Payer: Medicaid Other | Admitting: *Deleted

## 2014-02-14 VITALS — BP 117/84 | HR 101 | Temp 97.9°F | Ht 64.0 in | Wt 290.0 lb

## 2014-02-14 DIAGNOSIS — Z3042 Encounter for surveillance of injectable contraceptive: Secondary | ICD-10-CM

## 2014-02-14 NOTE — Progress Notes (Signed)
Patient is in the office today for her DEPO Injection. Patient is on time for her Injection. Injection given in Right Deltoid. Patient tolerated well. Patient denies any concerns today.  Patient notified to return on May 08, 2014 for her next DEPO Injection. Patient voiced understanding.   BP 117/84 mmHg  Pulse 101  Temp(Src) 97.9 F (36.6 C)  Ht 5\' 4"  (1.626 m)  Wt 290 lb (131.543 kg)  BMI 49.75 kg/m2  Administrations This Visit    medroxyPROGESTERone (DEPO-PROVERA) injection 150 mg    Administered Action Dose Route Administered By         02/14/2014 Given 150 mg Intramuscular Odessa FlemingKristina M Omega Durante, LPN

## 2014-03-21 ENCOUNTER — Ambulatory Visit (INDEPENDENT_AMBULATORY_CARE_PROVIDER_SITE_OTHER): Payer: Medicaid Other | Admitting: Obstetrics

## 2014-03-21 ENCOUNTER — Encounter: Payer: Self-pay | Admitting: Obstetrics

## 2014-03-21 VITALS — BP 120/85 | HR 105 | Temp 98.1°F | Ht 63.0 in | Wt 291.0 lb

## 2014-03-21 DIAGNOSIS — Z0000 Encounter for general adult medical examination without abnormal findings: Secondary | ICD-10-CM

## 2014-03-21 NOTE — Progress Notes (Signed)
Subjective:     Karen Robles is a 33 y.o. female here for a routine exam.  Current complaints: none.    Personal health questionnaire:  Is patient Ashkenazi Jewish, have a family history of breast and/or ovarian cancer: no Is there a family history of uterine cancer diagnosed at age < 6050, gastrointestinal cancer, urinary tract cancer, family member who is a Personnel officerLynch syndrome-associated carrier: no Is the patient overweight and hypertensive, family history of diabetes, personal history of gestational diabetes or PCOS: no Is patient over 1955, have PCOS,  family history of premature CHD under age 31, diabetes, smoke, have hypertension or peripheral artery disease:  no At any time, has a partner hit, kicked or otherwise hurt or frightened you?: no Over the past 2 weeks, have you felt down, depressed or hopeless?: no Over the past 2 weeks, have you felt little interest or pleasure in doing things?:no   Gynecologic History No LMP recorded. Patient has had an injection. Contraception: Depo-Provera injections Last Pap: 2014. Results were: normal Last mammogram: n/a. Results were: n/a  Obstetric History OB History  Gravida Para Term Preterm AB SAB TAB Ectopic Multiple Living  2 2 2       2     # Outcome Date GA Lbr Len/2nd Weight Sex Delivery Anes PTL Lv  2 Term 2012 [id number]   F    Y  1 Term 2004   6 lb 4 oz (2.835 kg) F    Y      Past Medical History  Diagnosis Date  . Obesity   . Hypertension   . Pulmonary embolism     Past Surgical History  Procedure Laterality Date  . Mouth surgery    . Wisdom tooth extraction Bilateral     Current outpatient prescriptions: medroxyPROGESTERone (DEPO-PROVERA) 150 MG/ML injection, Inject 1 mL (150 mg total) into the muscle every 3 (three) months., Disp: 1 mL, Rfl: 3;  rivaroxaban (XARELTO) 20 MG TABS tablet, Take 1 tablet (20 mg total) by mouth daily with supper., Disp: 30 tablet, Rfl: 3;  triamterene-hydrochlorothiazide (DYAZIDE) 37.5-25 MG per  capsule, take 1 capsule by mouth once daily, Disp: 30 capsule, Rfl: 11 Current facility-administered medications: medroxyPROGESTERone (DEPO-PROVERA) injection 150 mg, 150 mg, Intramuscular, Q90 days, Brock Badharles A Romanda Turrubiates, MD, 150 mg at 02/14/14 1117 No Known Allergies  History  Substance Use Topics  . Smoking status: Never Smoker   . Smokeless tobacco: Never Used  . Alcohol Use: No    Family History  Problem Relation Age of Onset  . Hypertension Mother     maternal aunts & uncles  . Diabetes Neg Hx   . Heart disease Neg Hx   . Healthy Brother   . Hypertension Maternal Aunt   . Hypertension Maternal Uncle   . Cancer Maternal Grandmother       Review of Systems  Constitutional: negative for fatigue and weight loss Respiratory: negative for cough and wheezing Cardiovascular: negative for chest pain, fatigue and palpitations Gastrointestinal: negative for abdominal pain and change in bowel habits Musculoskeletal:negative for myalgias Neurological: negative for gait problems and tremors Behavioral/Psych: negative for abusive relationship, depression Endocrine: negative for temperature intolerance   Genitourinary:negative for abnormal menstrual periods, genital lesions, hot flashes, sexual problems and vaginal discharge Integument/breast: negative for breast lump, breast tenderness, nipple discharge and skin lesion(s)    Objective:       BP 120/85 mmHg  Pulse 105  Temp(Src) 98.1 F (36.7 C)  Ht 5\' 3"  (1.6 m)  Wt  291 lb (131.997 kg)  BMI 51.56 kg/m2 General:   alert  Skin:   no rash or abnormalities  Lungs:   clear to auscultation bilaterally  Heart:   regular rate and rhythm, S1, S2 normal, no murmur, click, rub or gallop  Breasts:   normal without suspicious masses, skin or nipple changes or axillary nodes  Abdomen:  normal findings: no organomegaly, soft, non-tender and no hernia  Pelvis:  External genitalia: normal general appearance Urinary system: urethral meatus  normal and bladder without fullness, nontender Vaginal: normal without tenderness, induration or masses Cervix: normal appearance Adnexa: normal bimanual exam Uterus: anteverted and non-tender, normal size   Lab Review Urine pregnancy test Labs reviewed yes Radiologic studies reviewed no    Assessment:    Healthy female exam.    Plan:    Education reviewed: calcium supplements, low fat, low cholesterol diet, safe sex/STD prevention, self breast exams and weight bearing exercise. Contraception: Depo-Provera injections. Follow up in: 1 year.   No orders of the defined types were placed in this encounter.   No orders of the defined types were placed in this encounter.

## 2014-03-21 NOTE — Addendum Note (Signed)
Addended by: Marya LandryFOSTER, Kahli Fitzgerald D on: 03/21/2014 02:31 PM   Modules accepted: Orders

## 2014-03-23 LAB — PAP IG AND HPV HIGH-RISK: HPV DNA HIGH RISK: NOT DETECTED

## 2014-03-25 LAB — SURESWAB, VAGINOSIS/VAGINITIS PLUS
ATOPOBIUM VAGINAE: NOT DETECTED Log (cells/mL)
C. TRACHOMATIS RNA, TMA: NOT DETECTED
C. albicans, DNA: NOT DETECTED
C. glabrata, DNA: NOT DETECTED
C. parapsilosis, DNA: NOT DETECTED
C. tropicalis, DNA: NOT DETECTED
Gardnerella vaginalis: 6.4 Log (cells/mL)
LACTOBACILLUS SPECIES: NOT DETECTED Log (cells/mL)
MEGASPHAERA SPECIES: NOT DETECTED Log (cells/mL)
N. gonorrhoeae RNA, TMA: NOT DETECTED
T. VAGINALIS RNA, QL TMA: NOT DETECTED

## 2014-03-26 ENCOUNTER — Other Ambulatory Visit: Payer: Self-pay | Admitting: Obstetrics

## 2014-03-26 DIAGNOSIS — N760 Acute vaginitis: Principal | ICD-10-CM

## 2014-03-26 DIAGNOSIS — B9689 Other specified bacterial agents as the cause of diseases classified elsewhere: Secondary | ICD-10-CM

## 2014-03-26 MED ORDER — METRONIDAZOLE 500 MG PO TABS: 500.0000 mg | ORAL_TABLET | Freq: Two times a day (BID) | ORAL | Status: DC

## 2014-04-30 ENCOUNTER — Ambulatory Visit: Payer: Medicaid Other | Admitting: Dietician

## 2014-05-08 ENCOUNTER — Ambulatory Visit: Payer: Medicaid Other

## 2014-05-15 ENCOUNTER — Ambulatory Visit (INDEPENDENT_AMBULATORY_CARE_PROVIDER_SITE_OTHER): Payer: Medicaid Other | Admitting: *Deleted

## 2014-05-15 VITALS — BP 135/87 | HR 93 | Temp 97.8°F | Ht 63.0 in | Wt 290.0 lb

## 2014-05-15 DIAGNOSIS — Z3042 Encounter for surveillance of injectable contraceptive: Secondary | ICD-10-CM

## 2014-05-15 NOTE — Progress Notes (Signed)
Patient is in the office today for her DEPO Injection. Patient is on time for her Injection. Injection given in Right Deltoid, Patient tolerated well. Patient denies any concerns today. Patient notified to make an appointment with the front for Aug 07, 2014 for her next DEPO Injection. Patient voiced understanding.   BP 135/87 mmHg  Pulse 93  Temp(Src) 97.8 F (36.6 C)  Ht 5\' 3"  (1.6 m)  Wt 290 lb (131.543 kg)  BMI 51.38 kg/m2   Administrations This Visit    medroxyPROGESTERone (DEPO-PROVERA) injection 150 mg    Administered Action Dose Route Administered By         05/15/2014 Given 150 mg Intramuscular Odessa FlemingKristina M Reighan Hipolito, LPN

## 2014-08-07 ENCOUNTER — Ambulatory Visit (INDEPENDENT_AMBULATORY_CARE_PROVIDER_SITE_OTHER): Payer: Medicaid Other | Admitting: *Deleted

## 2014-08-07 VITALS — BP 130/90 | HR 97 | Temp 97.2°F | Ht 63.0 in | Wt 294.0 lb

## 2014-08-07 DIAGNOSIS — Z3040 Encounter for surveillance of contraceptives, unspecified: Secondary | ICD-10-CM

## 2014-08-07 DIAGNOSIS — Z3049 Encounter for surveillance of other contraceptives: Secondary | ICD-10-CM

## 2014-08-07 DIAGNOSIS — Z3042 Encounter for surveillance of injectable contraceptive: Secondary | ICD-10-CM

## 2014-08-07 NOTE — Progress Notes (Signed)
Patient in office today for a Depo Injection. Patient is on time for her injection. Patient tolerated injection well.  Patient to return to office for next injection on October 29, 2014.  BP 130/90 mmHg  Pulse 97  Temp(Src) 97.2 F (36.2 C)  Ht 5\' 3"  (1.6 m)  Wt 294 lb (133.358 kg)  BMI 52.09 kg/m2   Administrations This Visit    medroxyPROGESTERone (DEPO-PROVERA) injection 150 mg    Admin Date Action Dose Route Administered By         08/07/2014 Given 150 mg Intramuscular Henriette CombsAndrea L Mylinh Cragg, LPN

## 2014-10-10 ENCOUNTER — Telehealth: Payer: Self-pay | Admitting: Internal Medicine

## 2014-10-10 NOTE — Telephone Encounter (Signed)
Pt. Is requesting medication refill for Xarelto, and [facility] [address]....please call patient

## 2014-10-11 NOTE — Telephone Encounter (Signed)
rivaroxaban (XARELTO) 20 MG TABS tablet

## 2014-10-12 NOTE — Telephone Encounter (Signed)
Patient called requesting medication refill on rivaroxaban (XARELTO) 20 MG TABS tablet. Patient states she is completely out of medication.Please f/u with patient

## 2014-10-15 NOTE — Telephone Encounter (Signed)
Please call this patient and tell him to scheduled appt with Dr.Wall

## 2014-10-15 NOTE — Telephone Encounter (Signed)
Pt requesting refill on Xarelto but is not sure whether she needs to schedule appt with Dr Daleen SquibbWall in order to receive refill, last appt was on 01/17/14. Please f/u with pt.

## 2014-10-23 NOTE — Telephone Encounter (Signed)
Pt has appt on 10/26/14 and is requesting refill on Xarelto before then because she is out of medication.  Please f/u with pt.

## 2014-10-26 ENCOUNTER — Ambulatory Visit: Payer: Medicaid Other | Attending: Internal Medicine | Admitting: Internal Medicine

## 2014-10-26 ENCOUNTER — Encounter: Payer: Self-pay | Admitting: Internal Medicine

## 2014-10-26 VITALS — BP 121/83 | HR 99 | Temp 98.0°F | Resp 16 | Wt 291.4 lb

## 2014-10-26 DIAGNOSIS — Z7901 Long term (current) use of anticoagulants: Secondary | ICD-10-CM | POA: Diagnosis not present

## 2014-10-26 DIAGNOSIS — I2699 Other pulmonary embolism without acute cor pulmonale: Secondary | ICD-10-CM

## 2014-10-26 DIAGNOSIS — I10 Essential (primary) hypertension: Secondary | ICD-10-CM | POA: Diagnosis not present

## 2014-10-26 LAB — COMPLETE METABOLIC PANEL WITH GFR
ALT: 12 U/L (ref 0–35)
AST: 15 U/L (ref 0–37)
Albumin: 3.5 g/dL (ref 3.5–5.2)
Alkaline Phosphatase: 71 U/L (ref 39–117)
BUN: 21 mg/dL (ref 6–23)
CALCIUM: 8.9 mg/dL (ref 8.4–10.5)
CHLORIDE: 103 meq/L (ref 96–112)
CO2: 25 mEq/L (ref 19–32)
Creat: 1.04 mg/dL (ref 0.50–1.10)
GFR, EST AFRICAN AMERICAN: 82 mL/min
GFR, EST NON AFRICAN AMERICAN: 71 mL/min
GLUCOSE: 88 mg/dL (ref 70–99)
Potassium: 4 mEq/L (ref 3.5–5.3)
Sodium: 140 mEq/L (ref 135–145)
TOTAL PROTEIN: 7.1 g/dL (ref 6.0–8.3)
Total Bilirubin: 0.2 mg/dL (ref 0.2–1.2)

## 2014-10-26 MED ORDER — RIVAROXABAN 20 MG PO TABS: 20.0000 mg | ORAL_TABLET | Freq: Every day | ORAL | Status: DC

## 2014-10-26 NOTE — Progress Notes (Signed)
Patient here for follow up on her PE and for a refill on her xarelto Patient has been out of her medication for about two weeks

## 2014-10-26 NOTE — Progress Notes (Signed)
MRN: [id number] Name: Karen Robles  Sex: female Age: 34 y.o. DOB: [birth year]  Allergies: Review of patient's allergies indicates no known allergies.  Chief Complaint  Patient presents with  . Follow-up    HPI: Patient is 34 y.o. female who has history of hypertension, pulmonary embolism currently on Xarelto as per patient for the last 2 weeks she ran out of the medication and its refill, currently denies any headache dizziness chest and shortness of breath.  Past Medical History  Diagnosis Date  . Obesity   . Hypertension   . Pulmonary embolism     Past Surgical History  Procedure Laterality Date  . Mouth surgery    . Wisdom tooth extraction Bilateral       Medication List       This list is accurate as of: 10/26/14 11:59 AM.  Always use your most recent med list.               medroxyPROGESTERone 150 MG/ML injection  Commonly known as:  DEPO-PROVERA  Inject 1 mL (150 mg total) into the muscle every 3 (three) months.     rivaroxaban 20 MG Tabs tablet  Commonly known as:  XARELTO  Take 1 tablet (20 mg total) by mouth daily with supper.     triamterene-hydrochlorothiazide 37.5-25 MG per capsule  Commonly known as:  DYAZIDE  take 1 capsule by mouth once daily        Meds ordered this encounter  Medications  . rivaroxaban (XARELTO) 20 MG TABS tablet    Sig: Take 1 tablet (20 mg total) by mouth daily with supper.    Dispense:  30 tablet    Refill:  5     There is no immunization history on file for this patient.  Family History  Problem Relation Age of Onset  . Hypertension Mother     maternal aunts & uncles  . Diabetes Neg Hx   . Heart disease Neg Hx   . Healthy Brother   . Hypertension Maternal Aunt   . Hypertension Maternal Uncle   . Cancer Maternal Grandmother     History  Substance Use Topics  . Smoking status: Never Smoker   . Smokeless tobacco: Never Used  . Alcohol Use: No    Review of Systems   As noted in HPI  Filed  Vitals:   10/26/14 1056  BP: 121/83  Pulse: 99  Temp: 98 F (36.7 C)  Resp: 16    Physical Exam  Physical Exam  Constitutional: No distress.  Eyes: EOM are normal. Pupils are equal, round, and reactive to light.  Cardiovascular: Normal rate and regular rhythm.   Pulmonary/Chest: Breath sounds normal. No respiratory distress. She has no wheezes. She has no rales.  Musculoskeletal: She exhibits no edema.    CBC    Component Value Date/Time   WBC 9.6 09/05/2013 1148   RBC 5.00 09/05/2013 1148   HGB 13.4 09/05/2013 1148   HCT 39.8 09/05/2013 1148   PLT 428* 09/05/2013 1148   MCV 79.6 09/05/2013 1148   LYMPHSABS 3.9 09/05/2013 1148   MONOABS 0.4 09/05/2013 1148   EOSABS 0.1 09/05/2013 1148   BASOSABS 0.0 09/05/2013 1148    CMP     Component Value Date/Time   NA 136 09/05/2013 1148   K 3.6 09/05/2013 1148   CL 101 09/05/2013 1148   CO2 26 09/05/2013 1148   GLUCOSE 88 09/05/2013 1148   BUN 17 09/05/2013 1148   CREATININE 1.05  09/05/2013 1148   CREATININE 0.98 07/07/2013 1415   CALCIUM 9.1 09/05/2013 1148   PROT 7.7 09/05/2013 1148   ALBUMIN 3.7 09/05/2013 1148   AST 17 09/05/2013 1148   ALT 17 09/05/2013 1148   ALKPHOS 84 09/05/2013 1148   BILITOT 0.3 09/05/2013 1148   GFRNONAA 71 09/05/2013 1148   GFRNONAA 77* 07/07/2013 1415   GFRAA 82 09/05/2013 1148   GFRAA 89* 07/07/2013 1415    Lab Results  Component Value Date/Time   CHOL 143 09/05/2013 11:48 AM    Lab Results  Component Value Date/Time   HGBA1C 5.7* 02/08/2013 02:08 PM    Lab Results  Component Value Date/Time   AST 17 09/05/2013 11:48 AM    Assessment and Plan  Pulmonary embolism - Plan: continue with rivaroxaban (XARELTO) 20 MG TABS tablet  Essential hypertension, benign - Plan: blood pressure is well controlled continue with Dyazide , will repeat blood chemistry COMPLETE METABOLIC PANEL WITH GFR   Return in about 3 months (around 01/26/2015), or if symptoms worsen or fail to  improve.   This note has been created with Education officer, environmental. Any transcriptional errors are unintentional.    Doris Cheadle, MD

## 2014-10-26 NOTE — Patient Instructions (Signed)
DASH Eating Plan °DASH stands for "Dietary Approaches to Stop Hypertension." The DASH eating plan is a healthy eating plan that has been shown to reduce high blood pressure (hypertension). Additional health benefits may include reducing the risk of type 2 diabetes mellitus, heart disease, and stroke. The DASH eating plan may also help with weight loss. °WHAT DO I NEED TO KNOW ABOUT THE DASH EATING PLAN? °For the DASH eating plan, you will follow these general guidelines: °· Choose foods with a percent daily value for sodium of less than 5% (as listed on the food label). °· Use salt-free seasonings or herbs instead of table salt or sea salt. °· Check with your health care provider or pharmacist before using salt substitutes. °· Eat lower-sodium products, often labeled as "lower sodium" or "no salt added." °· Eat fresh foods. °· Eat more vegetables, fruits, and low-fat dairy products. °· Choose whole grains. Look for the word "whole" as the first word in the ingredient list. °· Choose fish and skinless chicken or turkey more often than red meat. Limit fish, poultry, and meat to 6 oz (170 g) each day. °· Limit sweets, desserts, sugars, and sugary drinks. °· Choose heart-healthy fats. °· Limit cheese to 1 oz (28 g) per day. °· Eat more home-cooked food and less restaurant, buffet, and fast food. °· Limit fried foods. °· Cook foods using methods other than frying. °· Limit canned vegetables. If you do use them, rinse them well to decrease the sodium. °· When eating at a restaurant, ask that your food be prepared with less salt, or no salt if possible. °WHAT FOODS CAN I EAT? °Seek help from a dietitian for individual calorie needs. °Grains °Whole grain or whole wheat bread. Brown rice. Whole grain or whole wheat pasta. Quinoa, bulgur, and whole grain cereals. Low-sodium cereals. Corn or whole wheat flour tortillas. Whole grain cornbread. Whole grain crackers. Low-sodium crackers. °Vegetables °Fresh or frozen vegetables  (raw, steamed, roasted, or grilled). Low-sodium or reduced-sodium tomato and vegetable juices. Low-sodium or reduced-sodium tomato sauce and paste. Low-sodium or reduced-sodium canned vegetables.  °Fruits °All fresh, canned (in natural juice), or frozen fruits. °Meat and Other Protein Products °Ground beef (85% or leaner), grass-fed beef, or beef trimmed of fat. Skinless chicken or turkey. Ground chicken or turkey. Pork trimmed of fat. All fish and seafood. Eggs. Dried beans, peas, or lentils. Unsalted nuts and seeds. Unsalted canned beans. °Dairy °Low-fat dairy products, such as skim or 1% milk, 2% or reduced-fat cheeses, low-fat ricotta or cottage cheese, or plain low-fat yogurt. Low-sodium or reduced-sodium cheeses. °Fats and Oils °Tub margarines without trans fats. Light or reduced-fat mayonnaise and salad dressings (reduced sodium). Avocado. Safflower, olive, or canola oils. Natural peanut or almond butter. °Other °Unsalted popcorn and pretzels. °The items listed above may not be a complete list of recommended foods or beverages. Contact your dietitian for more options. °WHAT FOODS ARE NOT RECOMMENDED? °Grains °White bread. White pasta. White rice. Refined cornbread. Bagels and croissants. Crackers that contain trans fat. °Vegetables °Creamed or fried vegetables. Vegetables in a cheese sauce. Regular canned vegetables. Regular canned tomato sauce and paste. Regular tomato and vegetable juices. °Fruits °Dried fruits. Canned fruit in light or heavy syrup. Fruit juice. °Meat and Other Protein Products °Fatty cuts of meat. Ribs, chicken wings, bacon, sausage, bologna, salami, chitterlings, fatback, hot dogs, bratwurst, and packaged luncheon meats. Salted nuts and seeds. Canned beans with salt. °Dairy °Whole or 2% milk, cream, half-and-half, and cream cheese. Whole-fat or sweetened yogurt. Full-fat   cheeses or blue cheese. Nondairy creamers and whipped toppings. Processed cheese, cheese spreads, or cheese  curds. °Condiments °Onion and garlic salt, seasoned salt, table salt, and sea salt. Canned and packaged gravies. Worcestershire sauce. Tartar sauce. Barbecue sauce. Teriyaki sauce. Soy sauce, including reduced sodium. Steak sauce. Fish sauce. Oyster sauce. Cocktail sauce. Horseradish. Ketchup and mustard. Meat flavorings and tenderizers. Bouillon cubes. Hot sauce. Tabasco sauce. Marinades. Taco seasonings. Relishes. °Fats and Oils °Butter, stick margarine, lard, shortening, ghee, and bacon fat. Coconut, palm kernel, or palm oils. Regular salad dressings. °Other °Pickles and olives. Salted popcorn and pretzels. °The items listed above may not be a complete list of foods and beverages to avoid. Contact your dietitian for more information. °WHERE CAN I FIND MORE INFORMATION? °National Heart, Lung, and [employer] Institute: www.nhlbi.nih.gov/health/health-topics/topics/dash/ °Document Released: 03/12/2011 Document Revised: 08/07/2013 Document Reviewed: 01/25/2013 °[employer]® Patient Information ©2015 [employer]. This information is not intended to replace advice given to you by your health care provider. Make sure you discuss any questions you have with your health care provider. ° °

## 2014-10-29 ENCOUNTER — Other Ambulatory Visit: Payer: Self-pay | Admitting: *Deleted

## 2014-10-29 ENCOUNTER — Ambulatory Visit: Payer: Medicaid Other

## 2014-10-29 ENCOUNTER — Telehealth: Payer: Self-pay

## 2014-10-29 DIAGNOSIS — Z3042 Encounter for surveillance of injectable contraceptive: Secondary | ICD-10-CM

## 2014-10-29 MED ORDER — MEDROXYPROGESTERONE ACETATE 150 MG/ML IM SUSP: 150.0000 mg | INTRAMUSCULAR | Status: DC

## 2014-10-29 NOTE — Telephone Encounter (Signed)
-----   Message from Doris Cheadle, MD sent at 10/29/2014 11:56 AM EDT ----- Call and let the Patient know that blood work is normal.

## 2014-10-29 NOTE — Telephone Encounter (Signed)
Patient is aware of her lab results 

## 2014-10-30 ENCOUNTER — Ambulatory Visit (INDEPENDENT_AMBULATORY_CARE_PROVIDER_SITE_OTHER): Payer: Medicaid Other | Admitting: *Deleted

## 2014-10-30 VITALS — BP 123/86 | HR 96 | Temp 97.2°F | Wt 290.0 lb

## 2014-10-30 DIAGNOSIS — Z3040 Encounter for surveillance of contraceptives, unspecified: Secondary | ICD-10-CM | POA: Diagnosis not present

## 2014-10-30 DIAGNOSIS — Z3042 Encounter for surveillance of injectable contraceptive: Secondary | ICD-10-CM

## 2014-10-30 MED ORDER — MEDROXYPROGESTERONE ACETATE 150 MG/ML IM SUSP
150.0000 mg | Freq: Once | INTRAMUSCULAR | Status: AC
  Administered 2014-10-30: 150 mg via INTRAMUSCULAR

## 2014-10-30 NOTE — Progress Notes (Signed)
Pt is in office for depo injection.  Pt is on time for injection.  Pt tolerated injection well.  Pt advised to RTO on 01-21-15 for next injection. Pt has no other concerns today.  BP 123/86 mmHg  Pulse 96  Temp(Src) 97.2 F (36.2 C)  Wt 290 lb (131.543 kg)  Administrations This Visit    medroxyPROGESTERone (DEPO-PROVERA) injection 150 mg    Admin Date Action Dose Route Administered By         10/30/2014 Given 150 mg Intramuscular Lanney Gins, CMA

## 2015-01-21 ENCOUNTER — Ambulatory Visit (INDEPENDENT_AMBULATORY_CARE_PROVIDER_SITE_OTHER): Payer: Medicaid Other | Admitting: *Deleted

## 2015-01-21 VITALS — BP 124/85 | HR 94 | Temp 98.2°F | Wt 286.0 lb

## 2015-01-21 DIAGNOSIS — Z3042 Encounter for surveillance of injectable contraceptive: Secondary | ICD-10-CM

## 2015-01-21 MED ORDER — MEDROXYPROGESTERONE ACETATE 150 MG/ML IM SUSP
150.0000 mg | Freq: Once | INTRAMUSCULAR | Status: AC
  Administered 2015-01-21: 150 mg via INTRAMUSCULAR

## 2015-01-21 NOTE — Progress Notes (Signed)
Pt is in office today for depo injection.  Pt is on time for her injection. Pt tolerated injection well. Pt advised to RTO on 04-14-15 for next injection. Pt has no other concerns today.  BP 124/85 mmHg  Pulse 94  Temp(Src) 98.2 F (36.8 C)  Wt 286 lb (129.729 kg)  Administrations This Visit    medroxyPROGESTERone (DEPO-PROVERA) injection 150 mg    Admin Date Action Dose Route Administered By         01/21/2015 Given 150 mg Intramuscular Lanney GinsSuzanne D Tamarcus Condie, CMA

## 2015-02-06 ENCOUNTER — Telehealth: Payer: Self-pay | Admitting: Internal Medicine

## 2015-02-06 ENCOUNTER — Telehealth: Payer: Self-pay | Admitting: *Deleted

## 2015-02-06 MED ORDER — TRIAMTERENE-HCTZ 37.5-25 MG PO CAPS: 1.0000 | ORAL_CAPSULE | Freq: Every day | ORAL | Status: DC

## 2015-02-06 NOTE — Telephone Encounter (Signed)
Patient is calling for a refill on her BP medication. 5:20 LM on VM- Patient needs to contact her primary care for her BP management. Dr Clearance CootsHarper is her GYN and gave her the Rx with the understanding that she would get established with someone.

## 2015-02-06 NOTE — Telephone Encounter (Signed)
Patients medication has been refilled.  °

## 2015-02-06 NOTE — Telephone Encounter (Signed)
Patient called and requested a med refill for triamterene-hydrochlorothiazide (DYAZIDE) 37.5-25 MG per capsule. Patient is out of medication. Please f/u with pt.

## 2015-02-14 IMAGING — CT CT ANGIO CHEST
2 of 8 series · 17 of 36 positions shown · IV contrast (omnipaque)
Comparison: None.

CLINICAL DATA: Pain with recent trauma

EXAM:
CT ANGIOGRAPHY CHEST WITH CONTRAST
TECHNIQUE: Multidetector CT imaging of the chest was performed using the
standard protocol during bolus administration of intravenous
contrast. Multiplanar CT image reconstructions and MIPs were
obtained to evaluate the vascular anatomy.
CONTRAST:  100mL OMNIPAQUE IOHEXOL 350 MG/ML SOLN

[Series 6: pe thins · axial · 0.68mm/px · z∈[+71,+254]mm · 16 of 209 slices shown]
[im 13/209  lung]
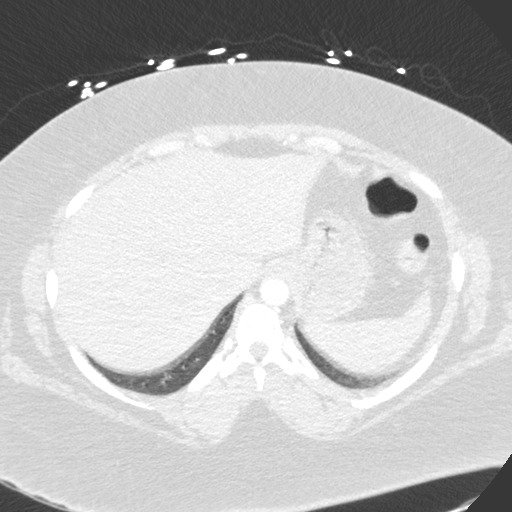
[im 25/209  mediastinal]
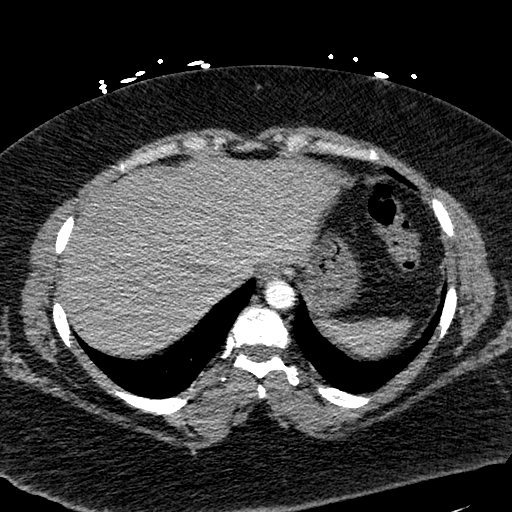
[im 37/209  lung]
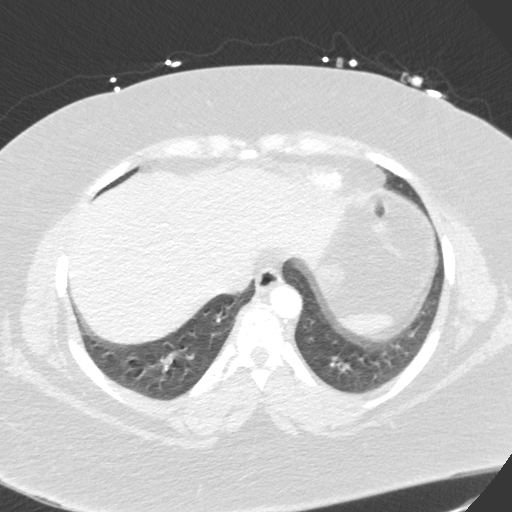
[im 49/209  mediastinal]
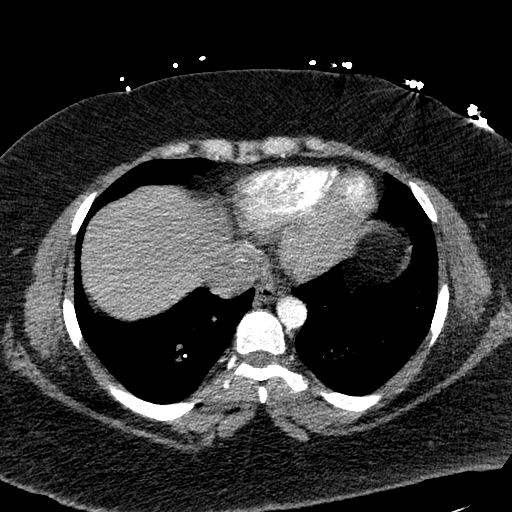
[im 62/209  lung]
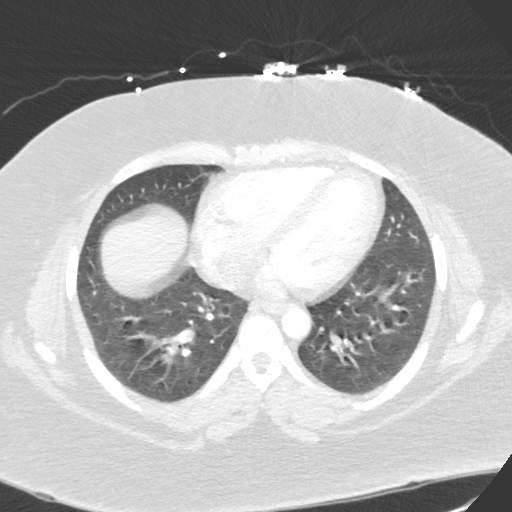
[im 74/209  mediastinal]
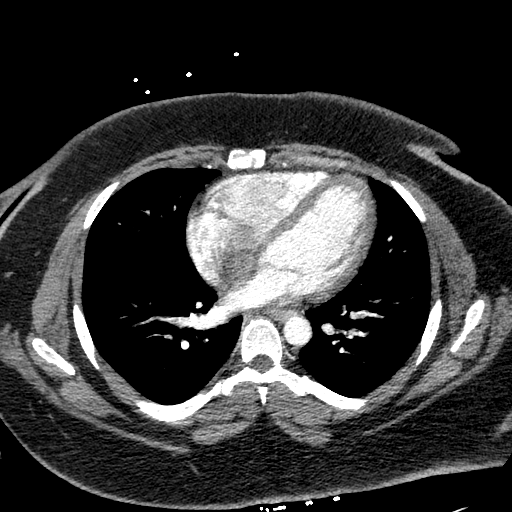
[im 86/209  lung]
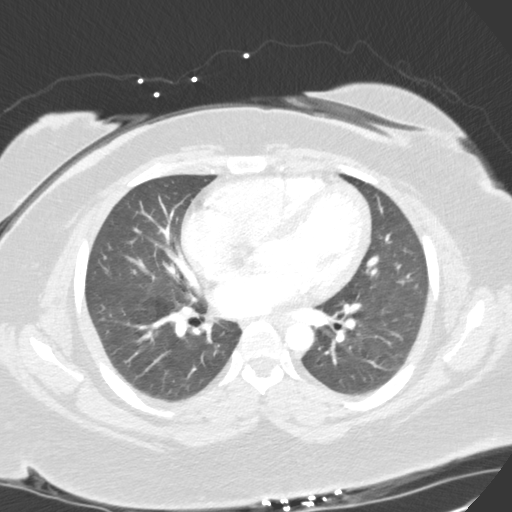
[im 98/209  mediastinal]
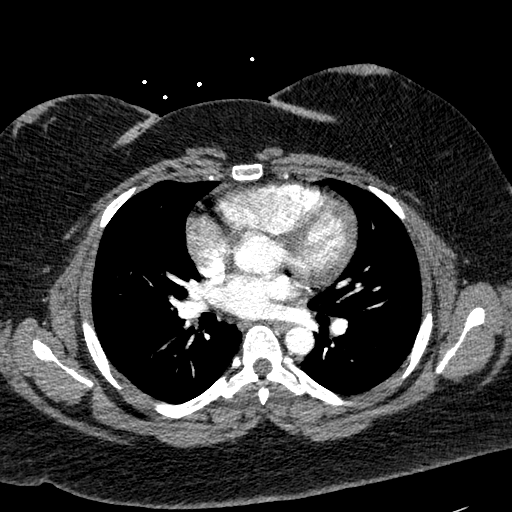
[im 111/209  lung]
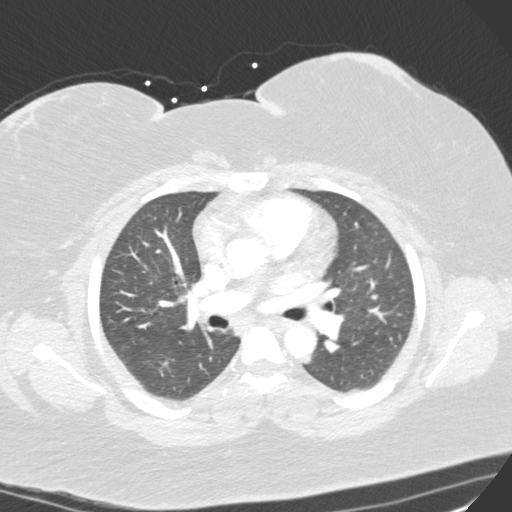
[im 123/209  mediastinal]
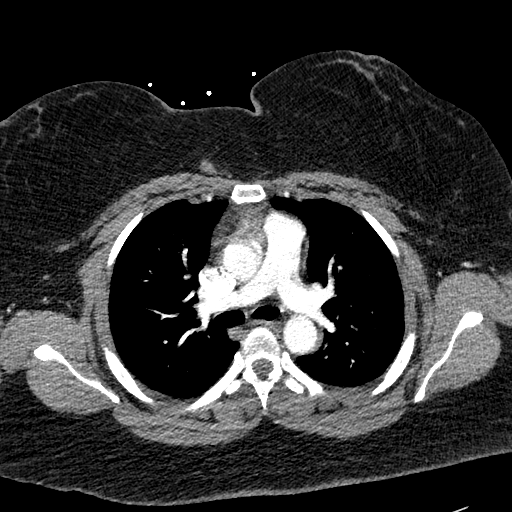
[im 135/209  lung]
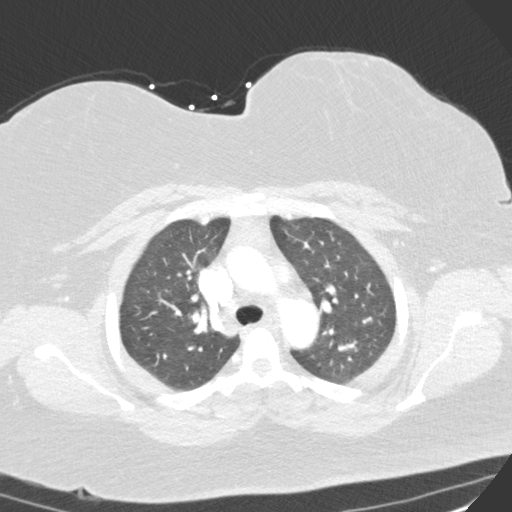
[im 147/209  mediastinal]
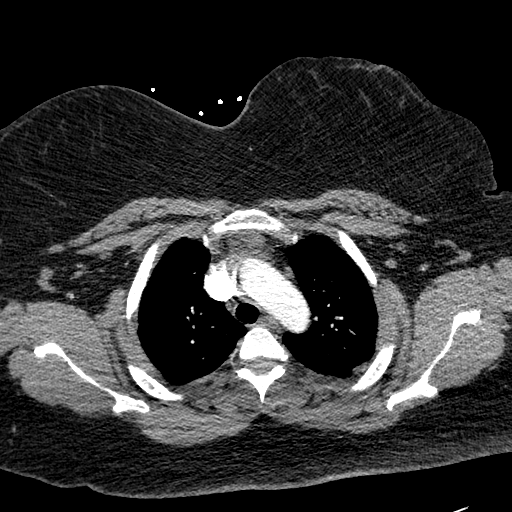
[im 160/209  lung]
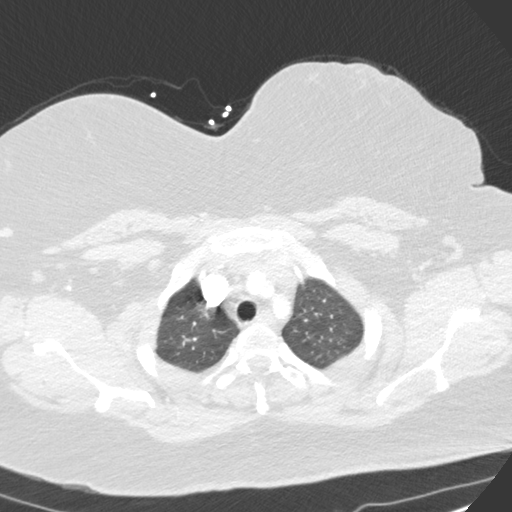
[im 172/209  mediastinal]
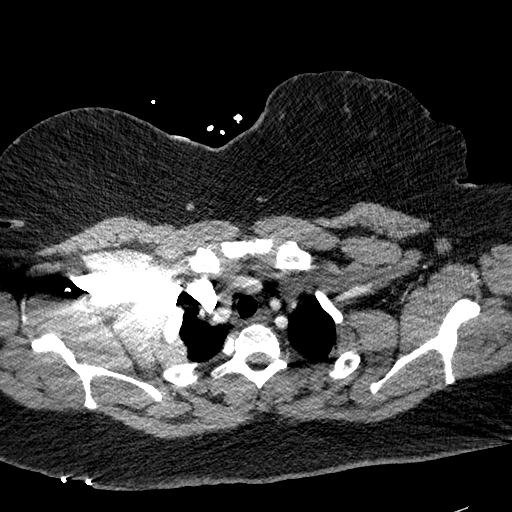
[im 184/209  lung]
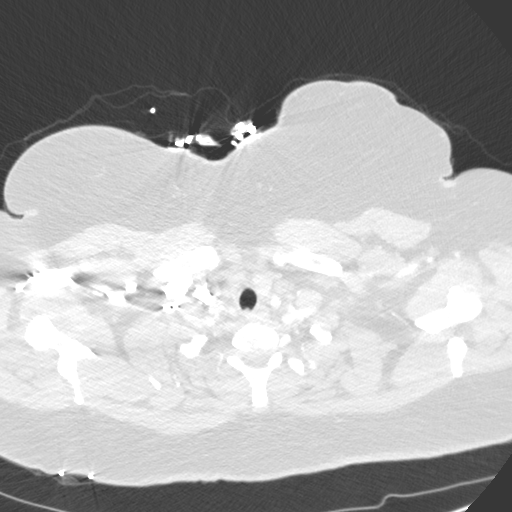
[im 196/209  mediastinal]
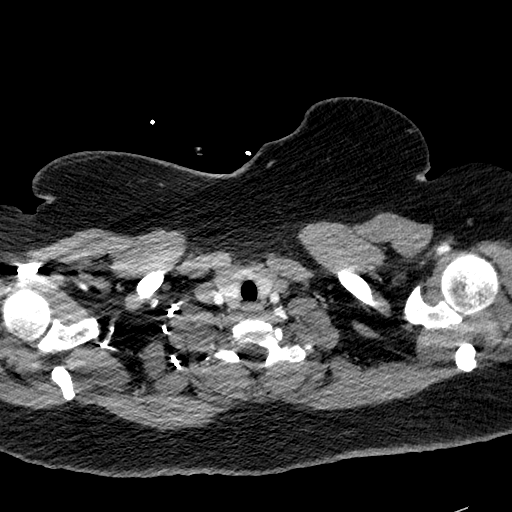

[mpr, coronals, coronal · coronal · 0.68mm/px · 1 of 114 slices shown]
[im 57/114  mediastinal]
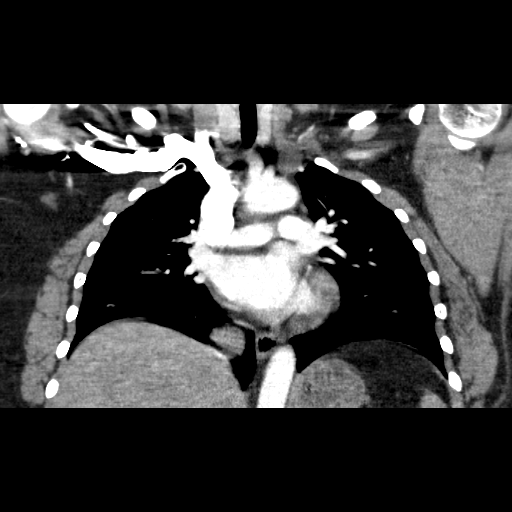

[17 of 36 positions shown; findings below may reference images not displayed]

FINDINGS: There are several small nonobstructing pulmonary emboli in the left
lower lobe pulmonary artery region. There is no major vessel
pulmonary embolus. There is no thoracic aortic aneurysm or
dissection.

The lungs are clear.  No pneumothorax or lung contusion.

There is no appreciable thoracic adenopathy. Pericardium is not
thickened.

Visualized upper abdominal structures appear normal. There are no
blastic or lytic bone lesions. No evidence of fracture. Visualized
thyroid appears normal.

Review of the MIP images confirms the above findings.
IMPRESSION: Several small left lower lobe pulmonary artery pulmonary emboli,
nonobstructing. No lung edema or consolidation.

Critical Value/emergent results were called by telephone at the time
of interpretation on 07/07/2013 at [DATE] to JHON VIDAL RECAVARREN , who
verbally acknowledged these results.

## 2015-04-15 ENCOUNTER — Ambulatory Visit (INDEPENDENT_AMBULATORY_CARE_PROVIDER_SITE_OTHER): Payer: Medicaid Other | Admitting: *Deleted

## 2015-04-15 VITALS — BP 134/93 | HR 88 | Temp 98.4°F | Wt 281.0 lb

## 2015-04-15 DIAGNOSIS — Z3042 Encounter for surveillance of injectable contraceptive: Secondary | ICD-10-CM

## 2015-04-15 DIAGNOSIS — Z3040 Encounter for surveillance of contraceptives, unspecified: Secondary | ICD-10-CM

## 2015-04-15 MED ORDER — MEDROXYPROGESTERONE ACETATE 150 MG/ML IM SUSP
150.0000 mg | INTRAMUSCULAR | Status: DC
  Administered 2015-04-15 – 2015-12-23 (×2): 150 mg via INTRAMUSCULAR

## 2015-04-15 NOTE — Progress Notes (Signed)
Patient is in the office for her Depo Provera. She is doing well with no bleeding.

## 2015-05-13 ENCOUNTER — Other Ambulatory Visit: Payer: Self-pay | Admitting: Internal Medicine

## 2015-05-17 ENCOUNTER — Ambulatory Visit: Payer: Medicaid Other | Attending: Internal Medicine | Admitting: Internal Medicine

## 2015-05-17 ENCOUNTER — Encounter: Payer: Self-pay | Admitting: Internal Medicine

## 2015-05-17 VITALS — BP 133/88 | HR 100 | Temp 98.7°F | Resp 16 | Ht 64.0 in | Wt 291.0 lb

## 2015-05-17 DIAGNOSIS — Z0000 Encounter for general adult medical examination without abnormal findings: Secondary | ICD-10-CM

## 2015-05-17 DIAGNOSIS — Z7901 Long term (current) use of anticoagulants: Secondary | ICD-10-CM | POA: Insufficient documentation

## 2015-05-17 DIAGNOSIS — Z23 Encounter for immunization: Secondary | ICD-10-CM | POA: Diagnosis not present

## 2015-05-17 DIAGNOSIS — Z6841 Body Mass Index (BMI) 40.0 and over, adult: Secondary | ICD-10-CM | POA: Insufficient documentation

## 2015-05-17 DIAGNOSIS — I10 Essential (primary) hypertension: Secondary | ICD-10-CM | POA: Diagnosis not present

## 2015-05-17 DIAGNOSIS — Z86711 Personal history of pulmonary embolism: Secondary | ICD-10-CM | POA: Diagnosis not present

## 2015-05-17 DIAGNOSIS — Z79899 Other long term (current) drug therapy: Secondary | ICD-10-CM | POA: Diagnosis not present

## 2015-05-17 LAB — BASIC METABOLIC PANEL
BUN: 19 mg/dL (ref 7–25)
CHLORIDE: 104 mmol/L (ref 98–110)
CO2: 27 mmol/L (ref 20–31)
Calcium: 8.8 mg/dL (ref 8.6–10.2)
Creat: 0.95 mg/dL (ref 0.50–1.10)
GLUCOSE: 80 mg/dL (ref 65–99)
POTASSIUM: 4.2 mmol/L (ref 3.5–5.3)
Sodium: 139 mmol/L (ref 135–146)

## 2015-05-17 MED ORDER — RIVAROXABAN 20 MG PO TABS: ORAL_TABLET | ORAL | Status: DC

## 2015-05-17 NOTE — Progress Notes (Signed)
Patient here for follow up on her HTN and for medications refills

## 2015-05-17 NOTE — Progress Notes (Signed)
Patient ID: Karen Robles, female   DOB: 1980/12/20, 35 y.o.   MRN: [id number] Subjective:   Karen Robles is a 35 y.o. female with hypertension and a past pulmonary emboli. Patient states that she has been taking her BP medication as directed but she stopped her Xarelto for a short period of time. Patient is on Depo-provera but states that she was told she may require lifelong anticoagulation.   Current Outpatient Prescriptions  Medication Sig Dispense Refill  . medroxyPROGESTERone (DEPO-PROVERA) 150 MG/ML injection Inject 1 mL (150 mg total) into the muscle every 3 (three) months. 1 mL 3  . triamterene-hydrochlorothiazide (DYAZIDE) 37.5-25 MG capsule Take 1 each (1 capsule total) by mouth daily. 30 capsule 11  . XARELTO 20 MG TABS tablet TAKE ONE TABLET BY MOUTH ONCE DAILY WITH SUPPER 30 tablet 0   Current Facility-Administered Medications  Medication Dose Route Frequency Provider Last Rate Last Dose  . medroxyPROGESTERone (DEPO-PROVERA) injection 150 mg  150 mg Intramuscular Q90 days Brock Bad, MD   150 mg at 04/15/15 1005    Hypertension ROS: taking medications as instructed, no medication side effects noted, no TIA's, no chest pain on exertion, no dyspnea on exertion, no swelling of ankles and no palpitations.   Objective:  BP 133/88 mmHg  Pulse 100  Temp(Src) 98.7 F (37.1 C)  Resp 16  Ht  (1.626 m)  Wt 291 lb (131.997 kg)  BMI 49.93 kg/m2  SpO2 100%  Appearance alert, well appearing, and in no distress, oriented to person, place, and time and overweight. General exam BP noted to be well controlled today in office, S1, S2 normal, no gallop, no murmur, chest clear, no JVD, no HSM, no edema.  Lab review: orders written for new lab studies as appropriate; see orders.   Assessment:   Karen Robles was seen today for follow-up.  Diagnoses and all orders for this visit:  Essential hypertension -     Basic Metabolic Panel Patient blood pressure is stable and may continue  on current medication.  Education on diet, exercise, and modifiable risk factors discussed. Will obtain appropriate labs as needed. Will follow up in 3-6 months.   History of pulmonary embolism -     rivaroxaban (XARELTO) 20 MG TABS tablet; TAKE ONE TABLET BY MOUTH ONCE DAILY WITH SUPPER I have reviewed coagulation panel which appears to be positive. Due to patients lab test and her weight I do suggest she stay on medication. I have also explained that there is risk of blood clots with Depo-provera but less than OCP and other estrogen containing forms of birth control. I have suggested looking [facility] that is hormone IUD. Patient desires to continue Depo.   Health care maintenance -     Flu Vaccine QUAD 36+ mos PF IM (Fluarix & Fluzone Quad PF) -     Tdap vaccine greater than or equal to 7yo IM  Morbid obesity, unspecified obesity type (HCC) I have stressed how weight loss places her at a greater risk for repeat events. We discussed ways for her to lose weight and keep it off. Patient counseled on diet and exercise.  Total time spent with patient was 25 minutes. > 50% spent counseling patient.  Return in about 3 months (around 08/14/2015) for Hypertension.   Ambrose Finland, NP 05/21/2015 3:42 PM

## 2015-05-20 ENCOUNTER — Telehealth: Payer: Self-pay

## 2015-05-20 NOTE — Telephone Encounter (Signed)
Spoke with patient this am and she is aware of her normal lab results 

## 2015-05-20 NOTE — Telephone Encounter (Signed)
-----   Message from Ambrose Finland, NP sent at 05/20/2015  8:44 AM EST ----- Labs are within normal limits

## 2015-05-24 ENCOUNTER — Encounter (HOSPITAL_COMMUNITY): Payer: Self-pay | Admitting: Emergency Medicine

## 2015-05-24 ENCOUNTER — Emergency Department (HOSPITAL_COMMUNITY)
Admission: EM | Admit: 2015-05-24 | Discharge: 2015-05-25 | Disposition: A | Discharge status: Home / Self Care | Payer: Medicaid Other | Attending: Emergency Medicine | Admitting: Emergency Medicine

## 2015-05-24 DIAGNOSIS — I10 Essential (primary) hypertension: Secondary | ICD-10-CM | POA: Insufficient documentation

## 2015-05-24 DIAGNOSIS — R51 Headache: Secondary | ICD-10-CM | POA: Insufficient documentation

## 2015-05-24 DIAGNOSIS — Z3202 Encounter for pregnancy test, result negative: Secondary | ICD-10-CM | POA: Insufficient documentation

## 2015-05-24 DIAGNOSIS — K529 Noninfective gastroenteritis and colitis, unspecified: Secondary | ICD-10-CM | POA: Diagnosis not present

## 2015-05-24 DIAGNOSIS — Z7901 Long term (current) use of anticoagulants: Secondary | ICD-10-CM | POA: Insufficient documentation

## 2015-05-24 DIAGNOSIS — Z793 Long term (current) use of hormonal contraceptives: Secondary | ICD-10-CM | POA: Insufficient documentation

## 2015-05-24 DIAGNOSIS — E669 Obesity, unspecified: Secondary | ICD-10-CM | POA: Diagnosis not present

## 2015-05-24 DIAGNOSIS — Z86711 Personal history of pulmonary embolism: Secondary | ICD-10-CM | POA: Insufficient documentation

## 2015-05-24 DIAGNOSIS — Z79899 Other long term (current) drug therapy: Secondary | ICD-10-CM | POA: Insufficient documentation

## 2015-05-24 DIAGNOSIS — R1030 Lower abdominal pain, unspecified: Secondary | ICD-10-CM | POA: Diagnosis present

## 2015-05-24 LAB — URINALYSIS, ROUTINE W REFLEX MICROSCOPIC
Bilirubin Urine: NEGATIVE
GLUCOSE, UA: NEGATIVE mg/dL
Hgb urine dipstick: NEGATIVE
Ketones, ur: NEGATIVE mg/dL
LEUKOCYTES UA: NEGATIVE
Nitrite: NEGATIVE
PH: 6 (ref 5.0–8.0)
Protein, ur: NEGATIVE mg/dL
Specific Gravity, Urine: 1.022 (ref 1.005–1.030)

## 2015-05-24 LAB — COMPREHENSIVE METABOLIC PANEL
ALBUMIN: 3.2 g/dL — AB (ref 3.5–5.0)
ALT: 21 U/L (ref 14–54)
AST: 18 U/L (ref 15–41)
Alkaline Phosphatase: 73 U/L (ref 38–126)
Anion gap: 11 (ref 5–15)
BUN: 16 mg/dL (ref 6–20)
CHLORIDE: 109 mmol/L (ref 101–111)
CO2: 19 mmol/L — ABNORMAL LOW (ref 22–32)
Calcium: 9.1 mg/dL (ref 8.9–10.3)
Creatinine, Ser: 1.02 mg/dL — ABNORMAL HIGH (ref 0.44–1.00)
GFR calc Af Amer: >60 mL/min (ref >60)
GFR calc non Af Amer: >60 mL/min (ref >60)
GLUCOSE: 115 mg/dL — AB (ref 65–99)
Potassium: 3.5 mmol/L (ref 3.5–5.1)
SODIUM: 139 mmol/L (ref 135–145)
Total Bilirubin: 0.5 mg/dL (ref 0.3–1.2)
Total Protein: 7.8 g/dL (ref 6.5–8.1)

## 2015-05-24 LAB — CBC
HEMATOCRIT: 42.6 % (ref 36.0–46.0)
Hemoglobin: 14.3 g/dL (ref 12.0–15.0)
MCH: 27.4 pg (ref 26.0–34.0)
MCHC: 33.6 g/dL (ref 30.0–36.0)
MCV: 81.6 fL (ref 78.0–100.0)
Platelets: 392 10*3/uL (ref 150–400)
RBC: 5.22 MIL/uL — ABNORMAL HIGH (ref 3.87–5.11)
RDW: 13.7 % (ref 11.5–15.5)
WBC: 11.8 10*3/uL — ABNORMAL HIGH (ref 4.0–10.5)

## 2015-05-24 LAB — POC URINE PREG, ED: Preg Test, Ur: NEGATIVE

## 2015-05-24 MED ORDER — SODIUM CHLORIDE 0.9 % IV BOLUS (SEPSIS)
1000.0000 mL | Freq: Once | INTRAVENOUS | Status: AC
  Administered 2015-05-25: 1000 mL via INTRAVENOUS

## 2015-05-24 NOTE — ED Provider Notes (Signed)
CSN: [id number]     Arrival date & time 05/24/15  2045 History  By signing my name below, I, Karen Robles, attest that this documentation has been prepared under the direction and in the presence of Shon Baton, MD. Electronically Signed: Phillis Robles, ED Scribe. 05/24/2015. 1:15 AM.   Chief Complaint  Patient presents with  . Abdominal Pain  . Emesis  . Diarrhea   The history is provided by the patient. No language interpreter was used.  HPI Comments: Karen Robles is a 35 y.o. Female with a hx of HTN and PE who presents to the Emergency Department complaining of intermittent low abdominal pain onset earlier this morning. Pt reports associated nausea, clear-green emesis, cramping lower abdominal pain that worsens with vomiting, headache, and diarrhea. She suspects that food poisoning from eating at a fast food restaurant could have caused her symptoms. Pt states that her daughter was throwing up last night. Pt takes Xarelto and triamterene-hydrocholothiazide daily. She denies fever, chills, or hematochezia.  Past Medical History  Diagnosis Date  . Obesity   . Hypertension   . Pulmonary embolism [facility])    Past Surgical History  Procedure Laterality Date  . Mouth surgery    . Wisdom tooth extraction Bilateral    Family History  Problem Relation Age of Onset  . Hypertension Mother     maternal aunts & uncles  . Diabetes Neg Hx   . Heart disease Neg Hx   . Healthy Brother   . Hypertension Maternal Aunt   . Hypertension Maternal Uncle   . Cancer Maternal Grandmother    Social History  Substance Use Topics  . Smoking status: Never Smoker   . Smokeless tobacco: Never Used  . Alcohol Use: No   OB History    Gravida Para Term Preterm AB TAB SAB Ectopic Multiple Living   Review of Systems  Constitutional: Negative for fever and chills.  Cardiovascular: Negative for chest pain.  Gastrointestinal: Positive for nausea, vomiting, abdominal pain and  diarrhea. Negative for blood in stool.  Neurological: Positive for headaches.  All other systems reviewed and are negative.     Allergies  Review of patient's allergies indicates no known allergies.  Home Medications   Prior to Admission medications   Medication Sig Start Date End Date Taking? Authorizing Provider  acetaminophen (TYLENOL) 500 MG tablet Take 1,000 mg by mouth every 6 (six) hours as needed for mild pain.   Yes Historical Provider, MD  medroxyPROGESTERone (DEPO-PROVERA) 150 MG/ML injection Inject 1 mL (150 mg total) into the muscle every 3 (three) months. 10/29/14  Yes Brock Bad, MD  rivaroxaban (XARELTO) 20 MG TABS tablet TAKE ONE TABLET BY MOUTH ONCE DAILY WITH SUPPER 05/17/15  Yes Ambrose Finland, NP  triamterene-hydrochlorothiazide (DYAZIDE) 37.5-25 MG capsule Take 1 each (1 capsule total) by mouth daily. 02/06/15  Yes Quentin Angst, MD  loperamide (IMODIUM) 2 MG capsule Take 1 capsule (2 mg total) by mouth 4 (four) times daily as needed for diarrhea or loose stools. 05/25/15   Shon Baton, MD  ondansetron (ZOFRAN ODT) 4 MG disintegrating tablet Take 1 tablet (4 mg total) by mouth every 8 (eight) hours as needed for nausea or vomiting. 05/25/15   Shon Baton, MD   BP 126/84 mmHg  Pulse 102  Temp(Src) 98 F (36.7 C) (Oral)  Resp 19  Ht  (1.626 m)  Wt  284 lb 2 oz (128.878 kg)  BMI 48.75 kg/m2  SpO2 99% Physical Exam  Constitutional: She is oriented to person, place, and time. She appears well-developed and well-nourished.  Obese  HENT:  Head: Normocephalic and atraumatic.  Eyes: Pupils are equal, round, and reactive to light.  Cardiovascular: Normal rate, regular rhythm and normal heart sounds.   No murmur heard. Pulmonary/Chest: Effort normal and breath sounds normal. No respiratory distress. She has no wheezes.  Abdominal: Soft. Bowel sounds are normal. There is no tenderness. There is no rebound.  Musculoskeletal: She exhibits no  edema.  Neurological: She is alert and oriented to person, place, and time.  Skin: Skin is warm and dry.  Psychiatric: She has a normal mood and affect.  Nursing note and vitals reviewed.   ED Course  Procedures (including critical care time) DIAGNOSTIC STUDIES: Oxygen Saturation is 99% on RA, normal by my interpretation.       Labs Review Labs Reviewed  COMPREHENSIVE METABOLIC PANEL - Abnormal; Notable for the following:    CO2 19 (*)    Glucose, Bld 115 (*)    Creatinine, Ser 1.02 (*)    Albumin 3.2 (*)    All other components within normal limits  CBC - Abnormal; Notable for the following:    WBC 11.8 (*)    RBC 5.22 (*)    All other components within normal limits  URINALYSIS, ROUTINE W REFLEX MICROSCOPIC (NOT AT [facility])  POC URINE PREG, ED    Imaging Review No results found. I have personally reviewed and evaluated these images and lab results as part of my medical decision-making.   EKG Interpretation None      MDM   Final diagnoses:  Gastroenteritis    Patient presents with vomiting, diarrhea, upper abdominal pain. Known sick contacts with similar symptoms. Nontoxic on exam. Initial vital signs notable for heart rate of 133. Heart rate 84 on my examination. She is obese. Nontoxic. No signs of peritonitis or significant abdominal pain. Suspect viral etiology. Patient was given fluids, Zofran, and Imodium. She is able to tolerate oral hydration.  Discussed with patient supportive measures at home including Imodium and Zofran.  After history, exam, and medical workup I feel the patient has been appropriately medically screened and is safe for discharge home. Pertinent diagnoses were discussed with the patient. Patient was given return precautions.  I personally performed the services described in this documentation, which was scribed in my presence. The recorded information has been reviewed and is accurate.    Shon Baton, MD 05/25/15 [phone]

## 2015-05-24 NOTE — ED Notes (Signed)
Pt. reports intermittent low abdominal pain with nausea , emesis and diarrhea onset this morning , denies fever or chills , she suspects food poisoning after eating at a local fast food restaurant .

## 2015-05-25 MED ORDER — ONDANSETRON HCL 4 MG/2ML IJ SOLN
4.0000 mg | Freq: Once | INTRAMUSCULAR | Status: AC
  Administered 2015-05-25: 4 mg via INTRAVENOUS
  Filled 2015-05-25: qty 2

## 2015-05-25 MED ORDER — LOPERAMIDE HCL 2 MG PO CAPS: 2.0000 mg | ORAL_CAPSULE | Freq: Four times a day (QID) | ORAL | Status: DC | PRN

## 2015-05-25 MED ORDER — ONDANSETRON 4 MG PO TBDP: 4.0000 mg | ORAL_TABLET | Freq: Three times a day (TID) | ORAL | Status: DC | PRN

## 2015-05-25 MED ORDER — LOPERAMIDE HCL 2 MG PO CAPS
4.0000 mg | ORAL_CAPSULE | Freq: Once | ORAL | Status: AC
Start: 2015-05-25 — End: 2015-05-25
  Administered 2015-05-25: 4 mg via ORAL
  Filled 2015-05-25: qty 2

## 2015-05-25 MED ORDER — IBUPROFEN 400 MG PO TABS
600.0000 mg | ORAL_TABLET | Freq: Once | ORAL | Status: AC
  Administered 2015-05-25: 600 mg via ORAL
  Filled 2015-05-25: qty 1

## 2015-05-25 NOTE — ED Notes (Signed)
Patient consumed ginger ale as PO challenge. No vomiting after consuming.

## 2015-05-25 NOTE — ED Notes (Signed)
Patient is alert and orientedx4.  Patient was explained discharge instructions and they understood them with no questions.   

## 2015-05-25 NOTE — Discharge Instructions (Signed)

## 2015-07-08 ENCOUNTER — Ambulatory Visit: Payer: Medicaid Other

## 2015-07-09 ENCOUNTER — Ambulatory Visit: Payer: Medicaid Other | Admitting: *Deleted

## 2015-07-09 VITALS — BP 132/92 | HR 91 | Temp 97.6°F

## 2015-07-09 DIAGNOSIS — Z3042 Encounter for surveillance of injectable contraceptive: Secondary | ICD-10-CM

## 2015-07-09 NOTE — Progress Notes (Signed)
Patient here for Depo injection. Patient states she has no complaints. Tolerated injection well R- arm  RTO 09-30-15.

## 2015-07-26 MED FILL — **XARELTO 20 MG TABLET: 20 MG | 30 days supply | Qty: 30 | Fill #0

## 2015-08-19 ENCOUNTER — Other Ambulatory Visit: Payer: Self-pay | Admitting: Internal Medicine

## 2015-08-19 DIAGNOSIS — Z86711 Personal history of pulmonary embolism: Secondary | ICD-10-CM

## 2015-08-19 MED ORDER — RIVAROXABAN 20 MG PO TABS: ORAL_TABLET | ORAL | Status: DC

## 2015-09-04 MED FILL — XARELTO 20 MG TABLET: 20 | 30 days supply | Qty: 30 | Fill #1

## 2015-09-25 ENCOUNTER — Other Ambulatory Visit: Payer: Self-pay | Admitting: Pharmacist

## 2015-09-25 MED ORDER — HYDROCHLOROTHIAZIDE 25 MG PO TABS: 25.0000 mg | ORAL_TABLET | Freq: Every day | ORAL | Status: DC

## 2015-09-25 NOTE — Telephone Encounter (Signed)
Triamterene-HCTZ is not covered by Medicaid. Triamterene appears to have little benefit for blood pressure reduction so will order HCTZ 25 mg daily. Patient to follow up in clinic for blood pressure evaluation.

## 2015-09-26 ENCOUNTER — Telehealth: Payer: Self-pay | Admitting: *Deleted

## 2015-09-26 DIAGNOSIS — Z3042 Encounter for surveillance of injectable contraceptive: Secondary | ICD-10-CM

## 2015-09-26 MED ORDER — MEDROXYPROGESTERONE ACETATE 150 MG/ML IM SUSP: 150.0000 mg | INTRAMUSCULAR | Status: DC

## 2015-09-26 NOTE — Telephone Encounter (Signed)
Patient wants a refill of her Depo Provera- she is overdue for her annual exam. Must have annual exam before she gets her next shot- 1 refill sent in

## 2015-09-30 ENCOUNTER — Ambulatory Visit (INDEPENDENT_AMBULATORY_CARE_PROVIDER_SITE_OTHER): Payer: Medicaid Other | Admitting: *Deleted

## 2015-09-30 DIAGNOSIS — Z3049 Encounter for surveillance of other contraceptives: Secondary | ICD-10-CM | POA: Diagnosis not present

## 2015-09-30 DIAGNOSIS — Z3042 Encounter for surveillance of injectable contraceptive: Secondary | ICD-10-CM

## 2015-09-30 MED ORDER — MEDROXYPROGESTERONE ACETATE 150 MG/ML IM SUSP
150.0000 mg | Freq: Once | INTRAMUSCULAR | Status: AC
  Administered 2015-09-30: 150 mg via INTRAMUSCULAR

## 2015-09-30 NOTE — Progress Notes (Signed)
Patient tolerated Depo injection well today in R-arm. RTO 12-22-15 for Depo. Also time for annual exam.

## 2015-10-03 MED FILL — XARELTO 20 MG TABLET: 20 | 30 days supply | Qty: 30 | Fill #2

## 2015-11-06 MED FILL — XARELTO 20 MG TABLET: 20 | 30 days supply | Qty: 30 | Fill #3

## 2015-12-05 ENCOUNTER — Encounter: Payer: Self-pay | Admitting: *Deleted

## 2015-12-05 ENCOUNTER — Encounter: Payer: Self-pay | Admitting: Obstetrics

## 2015-12-05 ENCOUNTER — Other Ambulatory Visit: Payer: Self-pay | Admitting: Obstetrics

## 2015-12-05 ENCOUNTER — Ambulatory Visit (INDEPENDENT_AMBULATORY_CARE_PROVIDER_SITE_OTHER): Payer: Medicaid Other | Admitting: Obstetrics

## 2015-12-05 DIAGNOSIS — Z3040 Encounter for surveillance of contraceptives, unspecified: Secondary | ICD-10-CM

## 2015-12-05 DIAGNOSIS — Z124 Encounter for screening for malignant neoplasm of cervix: Secondary | ICD-10-CM

## 2015-12-05 DIAGNOSIS — E669 Obesity, unspecified: Secondary | ICD-10-CM

## 2015-12-05 DIAGNOSIS — Z3049 Encounter for surveillance of other contraceptives: Secondary | ICD-10-CM

## 2015-12-05 DIAGNOSIS — Z01419 Encounter for gynecological examination (general) (routine) without abnormal findings: Secondary | ICD-10-CM

## 2015-12-05 NOTE — Progress Notes (Signed)
Patient ID: Karen DriverShannon L Gilroy, female   DOB: 1980/09/11, 35 y.o.   MRN: [id number]   Subjective:        Karen Robles is a 35 y.o. female here for a routine exam.  Current complaints: None.    Personal health questionnaire:  Is patient Ashkenazi Jewish, have a family history of breast and/or ovarian cancer: no Is there a family history of uterine cancer diagnosed at age < 5350, gastrointestinal cancer, urinary tract cancer, family member who is a Personnel officerLynch syndrome-associated carrier: no Is the patient overweight and hypertensive, family history of diabetes, personal history of gestational diabetes, preeclampsia or PCOS: no Is patient over 6355, have PCOS,  family history of premature CHD under age 33, diabetes, smoke, have hypertension or peripheral artery disease:  no At any time, has a partner hit, kicked or otherwise hurt or frightened you?: no Over the past 2 weeks, have you felt down, depressed or hopeless?: no Over the past 2 weeks, have you felt little interest or pleasure in doing things?:no   Gynecologic History No LMP recorded. Patient has had an injection. Contraception: Depo-Provera injections Last Pap: 2015. Results were: normal Last mammogram: n/a. Results were: n/a  Obstetric History OB History  Gravida Para Term Preterm AB Living  2 2 2     2   SAB TAB Ectopic Multiple Live Births          2    # Outcome Date GA Lbr Len/2nd Weight Sex Delivery Anes PTL Lv  2 Term 2012 [id number]   F    LIV  1 Term 2004   6 lb 4 oz (2.835 kg) F    LIV      Past Medical History:  Diagnosis Date  . Hypertension   . Obesity   . Pulmonary embolism [facility](HCC)     Past Surgical History:  Procedure Laterality Date  . MOUTH SURGERY    . WISDOM TOOTH EXTRACTION Bilateral      Current Outpatient Prescriptions:  .  acetaminophen (TYLENOL) 500 MG tablet, Take 1,000 mg by mouth every 6 (six) hours as needed for mild pain., Disp: , Rfl:  .  hydrochlorothiazide (HYDRODIURIL) 25 MG tablet, Take 1  tablet (25 mg total) by mouth daily., Disp: 30 tablet, Rfl: 0 .  medroxyPROGESTERone (DEPO-PROVERA) 150 MG/ML injection, Inject 1 mL (150 mg total) into the muscle every 3 (three) months., Disp: 1 mL, Rfl: 0 .  rivaroxaban (XARELTO) 20 MG TABS tablet, TAKE ONE TABLET BY MOUTH ONCE DAILY WITH SUPPER, Disp: 90 tablet, Rfl: 3  Current Facility-Administered Medications:  .  medroxyPROGESTERone (DEPO-PROVERA) injection 150 mg, 150 mg, Intramuscular, Q90 days, Brock Badharles A Harper, MD, 150 mg at 04/15/15 1005 No Known Allergies  Social History  Substance Use Topics  . Smoking status: Never Smoker  . Smokeless tobacco: Never Used  . Alcohol use No    Family History  Problem Relation Age of Onset  . Hypertension Mother     maternal aunts & uncles  . Hyperlipidemia Mother   . Healthy Brother   . Hypertension Maternal Aunt   . Hypertension Maternal Uncle   . Cancer Maternal Grandmother   . Diabetes Neg Hx   . Heart disease Neg Hx       Review of Systems  Constitutional: negative for fatigue and weight loss Respiratory: negative for cough and wheezing Cardiovascular: negative for chest pain, fatigue and palpitations Gastrointestinal: negative for abdominal pain and change in bowel habits Musculoskeletal:negative for myalgias Neurological: negative for  gait problems and tremors Behavioral/Psych: negative for abusive relationship, depression Endocrine: negative for temperature intolerance   Genitourinary:negative for abnormal menstrual periods, genital lesions, hot flashes, sexual problems and vaginal discharge Integument/breast: negative for breast lump, breast tenderness, nipple discharge and skin lesion(s)    Objective:       Temp 98.2 F (36.8 C) (Oral)   Ht 5\' 3"  (1.6 m)   Wt (!) 300 lb 1.6 oz (136.1 kg)   BMI 53.16 kg/m  General:   alert  Skin:   no rash or abnormalities  Lungs:   clear to auscultation bilaterally  Heart:   regular rate and rhythm, S1, S2 normal, no murmur,  click, rub or gallop  Breasts:   normal without suspicious masses, skin or nipple changes or axillary nodes  Abdomen:  normal findings: no organomegaly, soft, non-tender and no hernia  Pelvis:  External genitalia: normal general appearance Urinary system: urethral meatus normal and bladder without fullness, nontender Vaginal: normal without tenderness, induration or masses Cervix: normal appearance Adnexa: normal bimanual exam Uterus: anteverted and non-tender, normal size   Lab Review Urine pregnancy test Labs reviewed yes Radiologic studies reviewed no  50% of 20 min visit spent on counseling and coordination of care.   Assessment:    Healthy female exam.    Contraceptive Surveillance.  Pleased with Depo Provera.  Obesity     Plan:    Education reviewed: low fat, low cholesterol diet, safe sex/STD prevention, self breast exams and weight bearing exercise. Contraception: Depo-Provera injections. Follow up in: 1 year.   No orders of the defined types were placed in this encounter.  No orders of the defined types were placed in this encounter.

## 2015-12-10 LAB — PAP IG AND HPV HIGH-RISK
HPV, HIGH-RISK: NEGATIVE
PAP Smear Comment: 0

## 2015-12-10 MED FILL — XARELTO 20 MG TABLET: 20 | 30 days supply | Qty: 30 | Fill #4

## 2015-12-11 ENCOUNTER — Other Ambulatory Visit: Payer: Self-pay | Admitting: Obstetrics

## 2015-12-11 DIAGNOSIS — B373 Candidiasis of vulva and vagina: Secondary | ICD-10-CM

## 2015-12-11 DIAGNOSIS — B3731 Acute candidiasis of vulva and vagina: Secondary | ICD-10-CM

## 2015-12-11 LAB — NUSWAB VG+, CANDIDA 6SP
CANDIDA KRUSEI, NAA: NEGATIVE
Candida albicans, NAA: POSITIVE — AB
Candida glabrata, NAA: NEGATIVE
Candida lusitaniae, NAA: NEGATIVE
Candida parapsilosis, NAA: NEGATIVE
Candida tropicalis, NAA: NEGATIVE
Chlamydia trachomatis, NAA: NEGATIVE
Neisseria gonorrhoeae, NAA: NEGATIVE
Trich vag by NAA: NEGATIVE

## 2015-12-11 MED ORDER — FLUCONAZOLE 150 MG PO TABS: 150.0000 mg | ORAL_TABLET | Freq: Once | ORAL | 2 refills | Status: AC

## 2015-12-12 ENCOUNTER — Telehealth: Payer: Self-pay | Admitting: *Deleted

## 2015-12-12 NOTE — Telephone Encounter (Signed)
Phone message left to call office for lab result 

## 2015-12-12 NOTE — Telephone Encounter (Signed)
Patient returned call and made aware of lab result.Patient is aware of medication sent to her pharmacy for yeast.

## 2015-12-20 ENCOUNTER — Other Ambulatory Visit: Payer: Self-pay | Admitting: Internal Medicine

## 2015-12-20 ENCOUNTER — Other Ambulatory Visit: Payer: Self-pay | Admitting: Obstetrics

## 2015-12-20 DIAGNOSIS — Z3042 Encounter for surveillance of injectable contraceptive: Secondary | ICD-10-CM

## 2015-12-20 MED ORDER — MEDROXYPROGESTERONE ACETATE 150 MG/ML IM SUSP: 150.0000 mg | INTRAMUSCULAR | 0 refills | Status: DC

## 2015-12-20 NOTE — Telephone Encounter (Signed)
Patient wants refill of her Depo Provera

## 2015-12-23 ENCOUNTER — Ambulatory Visit (INDEPENDENT_AMBULATORY_CARE_PROVIDER_SITE_OTHER): Payer: Medicaid Other

## 2015-12-23 DIAGNOSIS — Z3042 Encounter for surveillance of injectable contraceptive: Secondary | ICD-10-CM

## 2015-12-23 DIAGNOSIS — Z3049 Encounter for surveillance of other contraceptives: Secondary | ICD-10-CM

## 2015-12-23 LAB — POCT URINE PREGNANCY: PREG TEST UR: NEGATIVE

## 2015-12-23 MED ORDER — MEDROXYPROGESTERONE ACETATE 150 MG/ML IM SUSP
150.0000 mg | Freq: Once | INTRAMUSCULAR | Status: AC
  Administered 2016-03-17: 150 mg via INTRAMUSCULAR

## 2015-12-23 NOTE — Progress Notes (Signed)
Patient seen in office for DEPO injection; administered and patient tolerated well. Follow up in 3 months.

## 2015-12-24 ENCOUNTER — Other Ambulatory Visit: Payer: Self-pay | Admitting: *Deleted

## 2015-12-24 DIAGNOSIS — Z3042 Encounter for surveillance of injectable contraceptive: Secondary | ICD-10-CM

## 2015-12-24 MED ORDER — MEDROXYPROGESTERONE ACETATE 150 MG/ML IM SUSP: 150.0000 mg | INTRAMUSCULAR | 3 refills | Status: DC

## 2016-01-16 MED FILL — XARELTO 20 MG TABLET: 20 | 30 days supply | Qty: 30 | Fill #5

## 2016-02-17 MED FILL — XARELTO 20 MG TABLET: 20 | 30 days supply | Qty: 30 | Fill #6

## 2016-03-17 ENCOUNTER — Ambulatory Visit (INDEPENDENT_AMBULATORY_CARE_PROVIDER_SITE_OTHER): Payer: Medicaid Other

## 2016-03-17 VITALS — BP 131/88 | HR 95

## 2016-03-17 DIAGNOSIS — Z3042 Encounter for surveillance of injectable contraceptive: Secondary | ICD-10-CM

## 2016-03-17 NOTE — Progress Notes (Signed)
Patient is in office for depo injection, administered and patient tolerated well .Marland Kitchen. Administrations This Visit    medroxyPROGESTERone (DEPO-PROVERA) injection 150 mg    Admin Date 03/17/2016 Action Given Dose 150 mg Route Intramuscular Administered By Katrina StackBrittany D Stalling, RN

## 2016-03-25 ENCOUNTER — Other Ambulatory Visit: Payer: Self-pay | Admitting: Internal Medicine

## 2016-03-26 MED FILL — XARELTO 20 MG TABLET: 20 | 30 days supply | Qty: 30 | Fill #7

## 2016-03-31 ENCOUNTER — Other Ambulatory Visit: Payer: Self-pay | Admitting: Internal Medicine

## 2016-04-01 ENCOUNTER — Telehealth: Payer: Self-pay | Admitting: Internal Medicine

## 2016-04-01 MED ORDER — HYDROCHLOROTHIAZIDE 25 MG PO TABS: 25.0000 mg | ORAL_TABLET | Freq: Every day | ORAL | 0 refills | Status: DC

## 2016-04-01 NOTE — Telephone Encounter (Signed)
Pt. Called requesting a 30 day supply for hydrochlorothiazide (HYDRODIURIL) 25 MG tablet Pt. Has been scheduled an appt for 04/14/16 to est. Care. She would like her Rx to [facility] on [address]. Please f/u

## 2016-04-01 NOTE — Telephone Encounter (Signed)
Hydrochlorothiazide refilled - must keep office visit for any further refills.

## 2016-04-14 ENCOUNTER — Ambulatory Visit: Payer: Self-pay | Attending: Internal Medicine | Admitting: Internal Medicine

## 2016-04-14 ENCOUNTER — Encounter: Payer: Self-pay | Admitting: Internal Medicine

## 2016-04-14 VITALS — BP 125/87 | HR 70 | Temp 98.4°F | Resp 16 | Wt 298.8 lb

## 2016-04-14 DIAGNOSIS — I10 Essential (primary) hypertension: Secondary | ICD-10-CM | POA: Insufficient documentation

## 2016-04-14 DIAGNOSIS — Z0000 Encounter for general adult medical examination without abnormal findings: Secondary | ICD-10-CM

## 2016-04-14 DIAGNOSIS — Z86711 Personal history of pulmonary embolism: Secondary | ICD-10-CM | POA: Insufficient documentation

## 2016-04-14 DIAGNOSIS — Z7901 Long term (current) use of anticoagulants: Secondary | ICD-10-CM | POA: Insufficient documentation

## 2016-04-14 DIAGNOSIS — Z79899 Other long term (current) drug therapy: Secondary | ICD-10-CM | POA: Insufficient documentation

## 2016-04-14 DIAGNOSIS — Z809 Family history of malignant neoplasm, unspecified: Secondary | ICD-10-CM | POA: Insufficient documentation

## 2016-04-14 DIAGNOSIS — Z833 Family history of diabetes mellitus: Secondary | ICD-10-CM | POA: Insufficient documentation

## 2016-04-14 DIAGNOSIS — Z131 Encounter for screening for diabetes mellitus: Secondary | ICD-10-CM | POA: Insufficient documentation

## 2016-04-14 DIAGNOSIS — Z23 Encounter for immunization: Secondary | ICD-10-CM | POA: Insufficient documentation

## 2016-04-14 DIAGNOSIS — Z1329 Encounter for screening for other suspected endocrine disorder: Secondary | ICD-10-CM

## 2016-04-14 DIAGNOSIS — Z8249 Family history of ischemic heart disease and other diseases of the circulatory system: Secondary | ICD-10-CM | POA: Insufficient documentation

## 2016-04-14 DIAGNOSIS — Z114 Encounter for screening for human immunodeficiency virus [HIV]: Secondary | ICD-10-CM | POA: Insufficient documentation

## 2016-04-14 LAB — CBC WITH DIFFERENTIAL/PLATELET
BASOS ABS: 93 {cells}/uL (ref 0–200)
Basophils Relative: 1 %
EOS PCT: 1 %
Eosinophils Absolute: 93 cells/uL (ref 15–500)
HCT: 39.2 % (ref 35.0–45.0)
Hemoglobin: 12.8 g/dL (ref 11.7–15.5)
Lymphocytes Relative: 35 %
Lymphs Abs: 3255 cells/uL (ref 850–3900)
MCH: 26.6 pg — AB (ref 27.0–33.0)
MCHC: 32.7 g/dL (ref 32.0–36.0)
MCV: 81.3 fL (ref 80.0–100.0)
MONOS PCT: 3 %
MPV: 8.7 fL (ref 7.5–12.5)
Monocytes Absolute: 279 cells/uL (ref 200–950)
NEUTROS ABS: 5580 {cells}/uL (ref 1500–7800)
NEUTROS PCT: 60 %
PLATELETS: 421 10*3/uL — AB (ref 140–400)
RBC: 4.82 MIL/uL (ref 3.80–5.10)
RDW: 14.1 % (ref 11.0–15.0)
WBC: 9.3 10*3/uL (ref 3.8–10.8)

## 2016-04-14 LAB — BASIC METABOLIC PANEL WITH GFR
BUN: 19 mg/dL (ref 7–25)
CALCIUM: 8.5 mg/dL — AB (ref 8.6–10.2)
CO2: 27 mmol/L (ref 20–31)
CREATININE: 1.09 mg/dL (ref 0.50–1.10)
Chloride: 101 mmol/L (ref 98–110)
GFR, EST AFRICAN AMERICAN: 77 mL/min (ref >=60)
GFR, Est Non African American: 67 mL/min (ref >=60)
Glucose, Bld: 106 mg/dL — ABNORMAL HIGH (ref 65–99)
Potassium: 2.9 mmol/L — ABNORMAL LOW (ref 3.5–5.3)
SODIUM: 136 mmol/L (ref 135–146)

## 2016-04-14 LAB — POCT GLYCOSYLATED HEMOGLOBIN (HGB A1C): Hemoglobin A1C: 5.3

## 2016-04-14 LAB — TSH: TSH: 1.43 m[IU]/L

## 2016-04-14 MED ORDER — RIVAROXABAN 20 MG PO TABS: ORAL_TABLET | ORAL | 3 refills | Status: DC

## 2016-04-14 MED ORDER — HYDROCHLOROTHIAZIDE 25 MG PO TABS: 25.0000 mg | ORAL_TABLET | Freq: Every day | ORAL | 0 refills | Status: DC

## 2016-04-14 NOTE — Patient Instructions (Addendum)
QUICK START PATIENT GUIDE TO LCHF/IF LOW CARB HIGH FAT / INTERMITTENT FASTING  Recommend: <50 gram carbohydrate a day for weightloss.  What is this diet and how does it work? o Insulin is a hormone made by your body that allows you to use sugar (glucose) from carbohydrates in the food you eat for energy or to store glucose (as fat) for future use  o Insulin levels need to be lowered in order to utilize our stored energy (fat) o Many struggling with obesity are insulin resistant and have high levels of insulin o This diet works to lower your insulin in two ways o Fasting - allows your insulin levels to naturally decrease  o Avoiding carbohydrates - carbs trigger increase in insulin Low Carb Healthy Fat (LCHF) o Get a free app for your phone, such as MyFitnessPal, to help you track your macronutrients (carbs/protein/fats) and to track your weight and body measurements to see your progress o Set your goal for around 10% carbs/20% protein/70% fat o A good starting goal for amount of net carbs per day is 50 grams (some will aim for 20 grams) o "Net carbs" refers to total grams of carbs minus grams of fiber (as fiber is not typically absorbed). For example, if a food has 5g total carb and 3g fiber, that would be 2g net carbs o Increase healthy fats - eg. olive oil, eggs, nuts, avocado, cheese, butter, coconut, meats, fish o Avoid high carb foods - eg. bread, pasta, potatoes, rice, cookies, soda, juice, anything sugary o Buy full-fat ingredients (avoid low-fat versions, which often have more sugar) o No need to count calories, but pay close attention to grams of carbs on labels Intermittent Fasting (IF) o "Fasting" is going a period of time without eating - it helps to stay busy and well-hydrated o Purpose of fasting is to allow insulin levels to drop as low as possible, allowing your body to switch into fat-burning mode o With this diet there are many approaches to fasting, but 16:8 and 24hr fasts  are commonly used o 16:8 fast, usually 5-7 days a week - Fasting for 16 hours of the day, then eating all meals for the day over course of 8 hours. o 24 hour fast, usually 1-3 days a week - Typically eating one meal a day, then fasting until the next day. Plenty of fluids (and some salt to help you hold onto fluids) are recommended during longer fasts.  o During fasts certain beverages are still acceptable - water, sparkling water, bone broth, black tea or coffee, or tea/coffee with small amount of heavy whipping cream Special note for those on diabetic medications o Discuss your medications with your physician. You may need to hold your medication or adjust to only taking when eating. Diabetics should keep close track of their blood sugars when making any changes to diet/meds, to ensure they are staying within normal limits For more info about LCHF/IF o Watch "Therapeutic Fasting - Solving the Two-Compartment Problem" video by Dr. Jason Fung on YouTube (https://www.youtube.com/watch?v=tIuj-oMN-Fk) for a great intro to these concepts o Read "The Obesity Code" and/or "The Complete Guide to Fasting" by Dr. Jason Fung o Go to www.dietdoctor.com for explanations, recipes, and infographics about foods to eat/avoid o Get a Free smartphone app that helps count carbohydrates  - ie MyKeto EXAMPLES TO GET STARTED Fasting Beverages -water (can add  tsp Pink Himalayan salt once or twice a day to help stay hydrated for longer fasts) -Sparkling water (such as   [employer] or similar; avoid any with artificial sweeteners)  -Bone broth (multiple recipes available online or can buy pre-made) -Tea or Coffee (Adding heavy whipping cream or coconut oil to your tea or coffee can be helpful if you find yourself getting too hungry during the fasts. Can also add cinnamon for flavor. Or "bulletproof coffee.") Low Carb Healthy Fat Breakfast (if not fasting) -eggs in butter or olive oil with avocado -omelet with veggies and  cheese  Lunch -hamburger with cheese and avocado wrapped in lettuce (no bun, no ketchup) -meat and cheese wrapped in lettuce (can dip in mustard or olive oil/vinegar/mayo) -salad with meat/cheese/nuts and higher fat dressing (vinaigrette or Ranch, etc) -tuna salad lettuce wrap -taco meat with cheese, sour cream, guacamole, cheese over lettuce  Dinner -steak with herb butter or [place] sauce -"Fathead" pizza (uses cheese and almond flour for the dough - several recipes available online) -roasted or grilled chicken with skin on, with low carb sauce (buffalo, garlic butter, alfredo, pesto, etc) -baked salmon with lemon butter -chicken alfredo with zucchini noodles -[place] butter chicken with low carb garlic naan -egg roll in a bowl  Side Dishes -mashed cauliflower (homemade or available in freezer section) -roast vegetables (green veggies that grow above ground rather than root veggies) with butter or cheese -Caprese salad (fresh mozzarella, tomato and basil with olive oil) -homemade low-carb coleslaw Snacks/Desserts (try to avoid unnecessary snacking and sweets in general) -celery or cucumber dipped in guacamole or sour cream dip -cheese and meat slices  -raspberries with whipped cream (can make homemade with no sugar added) -low carb [place] butter cake  AVOID - sugar, diet/regular soda, potatoes, breads, rice, pasta, candy, cookies, cakes, muffins, juice, high carb fruit (bananas, grapes), beer, ketchup, barbeque and other sweet sauces   -  Low-Sodium Eating Plan Sodium raises blood pressure and causes water to be held in the body. Getting less sodium from food will help lower your blood pressure, reduce any swelling, and protect your heart, liver, and kidneys. We get sodium by adding salt (sodium chloride) to food. Most of our sodium comes from canned, boxed, and frozen foods. Restaurant foods, fast foods, and pizza are also very high in sodium. Even if you take medicine to  lower your blood pressure or to reduce fluid in your body, getting less sodium from your food is important. What is my plan? Most people should limit their sodium intake to 2,300 mg a day. Your health care provider recommends that you limit your sodium intake to 2,000mg  a day. What do I need to know about this eating plan? For the low-sodium eating plan, you will follow these general guidelines:  Choose foods with a % Daily Value for sodium of less than 5% (as listed on the food label).  Use salt-free seasonings or herbs instead of table salt or sea salt.  Check with your health care provider or pharmacist before using salt substitutes.  Eat fresh foods.  Eat more vegetables and fruits.  Limit canned vegetables. If you do use them, rinse them well to decrease the sodium.  Limit cheese to 1 oz (28 g) per day.  Eat lower-sodium products, often labeled as "lower sodium" or "no salt added."  Avoid foods that contain monosodium glutamate (MSG). MSG is sometimes added to [place] food and some canned foods.  Check food labels (Nutrition Facts labels) on foods to learn how much sodium is in one serving.  Eat more home-cooked food and less restaurant, buffet, and fast food.  When eating at a restaurant, ask that your food be prepared with less salt, or no salt if possible. How do I read food labels for sodium information? The Nutrition Facts label lists the amount of sodium in one serving of the food. If you eat more than one serving, you must multiply the listed amount of sodium by the number of servings. Food labels may also identify foods as:  Sodium free-Less than 5 mg in a serving.  Very low sodium-35 mg or less in a serving.  Low sodium-140 mg or less in a serving.  Light in sodium-50% less sodium in a serving. For example, if a food that usually has 300 mg of sodium is changed to become light in sodium, it will have 150 mg of sodium.  Reduced sodium-25% less sodium in a  serving. For example, if a food that usually has 400 mg of sodium is changed to reduced sodium, it will have 300 mg of sodium. What foods can I eat? Grains  Low-sodium cereals, including oats, puffed wheat and rice, and shredded wheat cereals. Low-sodium crackers. Unsalted rice and pasta. Lower-sodium bread. Vegetables  Frozen or fresh vegetables. Low-sodium or reduced-sodium canned vegetables. Low-sodium or reduced-sodium tomato sauce and paste. Low-sodium or reduced-sodium tomato and vegetable juices. Fruits  Fresh, frozen, and canned fruit. Fruit juice. Meat and Other Protein Products  Low-sodium canned tuna and salmon. Fresh or frozen meat, poultry, seafood, and fish. Lamb. Unsalted nuts. Dried beans, peas, and lentils without added salt. Unsalted canned beans. Homemade soups without salt. Eggs. Dairy  Milk. Soy milk. Ricotta cheese. Low-sodium or reduced-sodium cheeses. Yogurt. Condiments  Fresh and dried herbs and spices. Salt-free seasonings. Onion and garlic powders. Low-sodium varieties of mustard and ketchup. Fresh or refrigerated horseradish. Lemon juice. Fats and Oils  Reduced-sodium salad dressings. Unsalted butter. Other  Unsalted popcorn and pretzels. The items listed above may not be a complete list of recommended foods or beverages. Contact your dietitian for more options.  What foods are not recommended? Grains  Instant hot cereals. Bread stuffing, pancake, and biscuit mixes. Croutons. Seasoned rice or pasta mixes. Noodle soup cups. Boxed or frozen macaroni and cheese. Self-rising flour. Regular salted crackers. Vegetables  Regular canned vegetables. Regular canned tomato sauce and paste. Regular tomato and vegetable juices. Frozen vegetables in sauces. Salted [place] fries. Olives. Rosita Fire. Relishes. Sauerkraut. Salsa. Meat and Other Protein Products  Salted, canned, smoked, spiced, or pickled meats, seafood, or fish. Bacon, ham, sausage, hot dogs, corned beef, chipped  beef, and packaged luncheon meats. Salt pork. Jerky. Pickled herring. Anchovies, regular canned tuna, and sardines. Salted nuts. Dairy  Processed cheese and cheese spreads. Cheese curds. Blue cheese and cottage cheese. Buttermilk. Condiments  Onion and garlic salt, seasoned salt, table salt, and sea salt. Canned and packaged gravies. Worcestershire sauce. Tartar sauce. Barbecue sauce. Teriyaki sauce. Soy sauce, including reduced sodium. Steak sauce. Fish sauce. Oyster sauce. Cocktail sauce. Horseradish that you find on the shelf. Regular ketchup and mustard. Meat flavorings and tenderizers. Bouillon cubes. Hot sauce. Tabasco sauce. Marinades. Taco seasonings. Relishes. Fats and Oils  Regular salad dressings. Salted butter. Margarine. Ghee. Bacon fat. Other  Potato and tortilla chips. Corn chips and puffs. Salted popcorn and pretzels. Canned or dried soups. Pizza. Frozen entrees and pot pies. The items listed above may not be a complete list of foods and beverages to avoid. Contact your dietitian for more information.  This information is not intended to replace advice given to you by your health care  provider. Make sure you discuss any questions you have with your health care provider. Document Released: 09/12/2001 Document Revised: 08/29/2015 Document Reviewed: 01/25/2013 Elsevier Interactive Patient Education  2017 Elsevier Inc. Influenza Virus Vaccine injection (Fluarix) What is this medicine? INFLUENZA VIRUS VACCINE (in floo EN zuh VAHY ruhs vak SEEN) helps to reduce the risk of getting influenza also known as the flu. This medicine may be used for other purposes; ask your health care provider or pharmacist if you have questions. COMMON BRAND NAME(S): Fluarix, Fluzone What should I tell my health care provider before I take this medicine? They need to know if you have any of these conditions: -bleeding disorder like hemophilia -fever or infection -Guillain-Barre syndrome or other  neurological problems -immune system problems -infection with the human immunodeficiency virus (HIV) or AIDS -low blood platelet counts -multiple sclerosis -an unusual or allergic reaction to influenza virus vaccine, eggs, chicken proteins, latex, gentamicin, other medicines, foods, dyes or preservatives -pregnant or trying to get pregnant -breast-feeding How should I use this medicine? This vaccine is for injection into a muscle. It is given by a health care professional. A copy of Vaccine Information Statements will be given before each vaccination. Read this sheet carefully each time. The sheet may change frequently. Talk to your pediatrician regarding the use of this medicine in children. Special care may be needed. Overdosage: If you think you have taken too much of this medicine contact a poison control center or emergency room at once. NOTE: This medicine is only for you. Do not share this medicine with others. What if I miss a dose? This does not apply. What may interact with this medicine? -chemotherapy or radiation therapy -medicines that lower your immune system like etanercept, anakinra, infliximab, and adalimumab -medicines that treat or prevent blood clots like warfarin -phenytoin -steroid medicines like prednisone or cortisone -theophylline -vaccines This list may not describe all possible interactions. Give your health care provider a list of all the medicines, herbs, non-prescription drugs, or dietary supplements you use. Also tell them if you smoke, drink alcohol, or use illegal drugs. Some items may interact with your medicine. What should I watch for while using this medicine? Report any side effects that do not go away within 3 days to your doctor or health care professional. Call your health care provider if any unusual symptoms occur within 6 weeks of receiving this vaccine. You may still catch the flu, but the illness is not usually as bad. You cannot get the flu  from the vaccine. The vaccine will not protect against colds or other illnesses that may cause fever. The vaccine is needed every year. What side effects may I notice from receiving this medicine? Side effects that you should report to your doctor or health care professional as soon as possible: -allergic reactions like skin rash, itching or hives, swelling of the face, lips, or tongue Side effects that usually do not require medical attention (report to your doctor or health care professional if they continue or are bothersome): -fever -headache -muscle aches and pains -pain, tenderness, redness, or swelling at site where injected -weak or tired This list may not describe all possible side effects. Call your doctor for medical advice about side effects. You may report side effects to FDA at 1-800-FDA-1088. Where should I keep my medicine? This vaccine is only given in a clinic, pharmacy, doctor's office, or other health care setting and will not be stored at home. NOTE: This sheet is a summary. It may  not cover all possible information. If you have questions about this medicine, talk to your doctor, pharmacist, or health care provider.  2017 Elsevier/Gold Standard (2007-10-19 09:30:40)

## 2016-04-14 NOTE — Progress Notes (Signed)
Karen FortsShannon Loor, is a 36 y.o. female  [id number]  [id number]  DOB - 09-29-1980  CC:  Chief Complaint  Patient presents with  . Establish Care       HPI: Karen Robles is a 36 y.o. female G2P2, here today to establish medical care, w/ hx of PE on 2015 on Xarelto since, on depo provera for birth control, htn and obesity.  She denies any co, denies any abnml bleeding on deposhots, next shot due in March she states.  Eating ok, not watching her salt intake.  Patient has No headache, No chest pain, No abdominal pain - No Nausea, No new weakness tingling or numbness, No Cough - SOB.  Denies pleuritic cp.    Review of Systems: Per hpi, o/w all systems reviewed and negative.    No Known Allergies Past Medical History:  Diagnosis Date  . Hypertension   . Obesity   . Pulmonary embolism [facility](HCC)    Current Outpatient Prescriptions on File Prior to Visit  Medication Sig Dispense Refill  . acetaminophen (TYLENOL) 500 MG tablet Take 1,000 mg by mouth every 6 (six) hours as needed for mild pain.    . medroxyPROGESTERone (DEPO-PROVERA) 150 MG/ML injection Inject 1 mL (150 mg total) into the muscle every 3 (three) months. 1 mL 3   No current facility-administered medications on file prior to visit.    Family History  Problem Relation Age of Onset  . Hypertension Mother     maternal aunts & uncles  . Hyperlipidemia Mother   . Healthy Brother   . Hypertension Maternal Aunt   . Hypertension Maternal Uncle   . Cancer Maternal Grandmother   . Diabetes Neg Hx   . Heart disease Neg Hx    Social History   Social History  . Marital status: Single    Spouse name: N/A  . Number of children: 2  . Years of education: N/A   Occupational History  . Unemployed    Social History Main Topics  . Smoking status: Never Smoker  . Smokeless tobacco: Never Used  . Alcohol use No  . Drug use: No  . Sexual activity: Yes    Partners: Male    Birth control/ protection: Injection    Other Topics Concern  . Not on file   Social History Narrative  . No narrative on file    Objective:   Vitals:   04/14/16 0903  BP: 125/87  Pulse: 70  Resp: 16  Temp: 98.4 F (36.9 C)    Filed Weights   04/14/16 0903  Weight: 298 lb 12.8 oz (135.5 kg)    BP Readings from Last 3 Encounters:  04/14/16 125/87  03/17/16 131/88  07/09/15 (!) 132/92    Physical Exam: Constitutional: Patient appears well-developed and well-nourished. No distress. AAOx3, no c/o, morbid obese,  HENT: Normocephalic, atraumatic, External right and left ear normal. Oropharynx is clear and moist.   bilat TMs clear. Eyes: Conjunctivae and EOM are normal. PERRL, no scleral icterus. Neck: Normal ROM. Neck supple. No JVD.  CVS: RRR, S1/S2 +, no murmurs, no gallops, no carotid bruit.  Pulmonary: Effort and breath sounds normal, no stridor, rhonchi, wheezes, rales.  Abdominal: Soft. BS +, no distension, obese, tenderness, rebound or guarding.  Musculoskeletal: Normal range of motion. No edema and no tenderness.  LE: bilat/ no c/c/e, pulses 2+ bilateral. Lymphadenopathy: No lymphadenopathy noted, cervical Neuro: Alert.  muscle tone coordination wnl. No cranial nerve deficit grossly. Skin: Skin is warm and dry. No  rash noted. Not diaphoretic. No erythema. No pallor. Psychiatric: Normal mood and affect. Behavior, judgment, thought content normal.  Lab Results  Component Value Date   WBC 11.8 (H) 05/24/2015   HGB 14.3 05/24/2015   HCT 42.6 05/24/2015   MCV 81.6 05/24/2015   PLT 392 05/24/2015   Lab Results  Component Value Date   CREATININE 1.02 (H) 05/24/2015   BUN 16 05/24/2015   NA 139 05/24/2015   K 3.5 05/24/2015   CL 109 05/24/2015   CO2 19 (L) 05/24/2015    Lab Results  Component Value Date   HGBA1C 5.3 04/14/2016   Lipid Panel     Component Value Date/Time   CHOL 143 09/05/2013 1148   TRIG 120 09/05/2013 1148   HDL 37 (L) 09/05/2013 1148   CHOLHDL 3.9 09/05/2013 1148    VLDL 24 09/05/2013 1148   LDLCALC 82 09/05/2013 1148        Depression screen PHQ 2/9 04/14/2016 02/08/2013  Decreased Interest 0 -  Down, Depressed, Hopeless 0 0  PHQ - 2 Score 0 0  Altered sleeping - 2  Tired, decreased energy - 0  Change in appetite - 2  Feeling bad or failure about yourself  - 0  Trouble concentrating - 0  Moving slowly or fidgety/restless - 0  Suicidal thoughts - 0  PHQ-9 Score - 4    Assessment and plan:   1. htn - continue hctz 25 qd, dw low salt diet recds, info provided   2. History of pulmonary embolism - xarelto indefinitely while on depoprovera, I suspect. - rivaroxaban (XARELTO) 20 MG TABS tablet; TAKE ONE TABLET BY MOUTH ONCE DAILY WITH SUPPER  Dispense: 90 tablet; Refill: 3 - CBC with Differential - BASIC METABOLIC PANEL WITH GFR  3. Encounter for screening for HIV - HIV antibody (with reflex)  4. Diabetes mellitus screening - HgB A1c  5. Thyroid disorder screen - TSH  6. Encounter for immunization - Flu Vaccine QUAD 36+ mos IM  7. Morbid obesity - low carb/HF/IF diet discussed, recd increase activity.   8. Healthcare maintenance No acute findings, flu vac today.  Return in about 3 months (around 07/13/2016), or if symptoms worsen or fail to improve.  The patient was given clear instructions to go to ER or return to medical center if symptoms don't improve, worsen or new problems develop. The patient verbalized understanding. The patient was told to call to get lab results if they haven't heard anything in the next week.    This note has been created with Education officer, environmental. Any transcriptional errors are unintentional.   Pete Glatter, MD, MBA/MHA [facility] [place], [place] [phone]   04/14/2016, 9:32 AM

## 2016-04-15 ENCOUNTER — Telehealth: Payer: Self-pay

## 2016-04-15 ENCOUNTER — Other Ambulatory Visit: Payer: Self-pay | Admitting: Internal Medicine

## 2016-04-15 DIAGNOSIS — E876 Hypokalemia: Secondary | ICD-10-CM

## 2016-04-15 LAB — HIV ANTIBODY (ROUTINE TESTING W REFLEX): HIV: NONREACTIVE

## 2016-04-15 MED ORDER — POTASSIUM CHLORIDE ER 10 MEQ PO TBCR: 10.0000 meq | EXTENDED_RELEASE_TABLET | Freq: Every day | ORAL | 0 refills | Status: DC

## 2016-04-15 NOTE — Telephone Encounter (Signed)
Contacted pt to go over lab results pt is aware of results and doesn't have any questions or cocnerns 

## 2016-05-29 ENCOUNTER — Other Ambulatory Visit: Payer: Self-pay | Admitting: Internal Medicine

## 2016-06-03 ENCOUNTER — Encounter: Payer: Self-pay | Admitting: Internal Medicine

## 2016-06-04 ENCOUNTER — Other Ambulatory Visit: Payer: Self-pay | Admitting: Internal Medicine

## 2016-06-04 MED ORDER — HYDROCHLOROTHIAZIDE 25 MG PO TABS: 25.0000 mg | ORAL_TABLET | Freq: Every day | ORAL | 2 refills | Status: DC

## 2016-06-04 NOTE — Telephone Encounter (Signed)
Patient request

## 2016-06-10 ENCOUNTER — Ambulatory Visit: Payer: Medicaid Other

## 2016-06-11 ENCOUNTER — Ambulatory Visit (INDEPENDENT_AMBULATORY_CARE_PROVIDER_SITE_OTHER): Payer: Medicaid Other

## 2016-06-11 DIAGNOSIS — Z3042 Encounter for surveillance of injectable contraceptive: Secondary | ICD-10-CM | POA: Insufficient documentation

## 2016-06-11 MED ORDER — MEDROXYPROGESTERONE ACETATE 150 MG/ML IM SUSP
150.0000 mg | Freq: Once | INTRAMUSCULAR | Status: AC
  Administered 2016-06-11: 150 mg via INTRAMUSCULAR

## 2016-06-11 NOTE — Progress Notes (Signed)
Nurse visit for Depo injection. Pt on time for injection. Depo given RA without difficulty. Next Depo due May 30

## 2016-08-20 ENCOUNTER — Encounter: Payer: Self-pay | Admitting: Internal Medicine

## 2016-09-02 ENCOUNTER — Ambulatory Visit: Payer: Medicaid Other

## 2016-09-08 ENCOUNTER — Ambulatory Visit (INDEPENDENT_AMBULATORY_CARE_PROVIDER_SITE_OTHER): Payer: Medicaid Other

## 2016-09-08 VITALS — BP 126/78 | HR 89

## 2016-09-08 DIAGNOSIS — Z309 Encounter for contraceptive management, unspecified: Secondary | ICD-10-CM

## 2016-09-08 DIAGNOSIS — Z3042 Encounter for surveillance of injectable contraceptive: Secondary | ICD-10-CM

## 2016-09-08 MED ORDER — MEDROXYPROGESTERONE ACETATE 150 MG/ML IM SUSP
150.0000 mg | Freq: Once | INTRAMUSCULAR | Status: AC
  Administered 2016-09-08: 150 mg via INTRAMUSCULAR

## 2016-09-16 ENCOUNTER — Other Ambulatory Visit: Payer: Self-pay | Admitting: *Deleted

## 2016-09-16 DIAGNOSIS — Z86711 Personal history of pulmonary embolism: Secondary | ICD-10-CM

## 2016-09-16 MED ORDER — RIVAROXABAN 20 MG PO TABS: ORAL_TABLET | ORAL | 3 refills | Status: DC

## 2016-09-16 MED FILL — **XARELTO 20 MG TABLET: 20 MG | 7 days supply | Qty: 7 | Fill #0

## 2016-09-16 NOTE — Telephone Encounter (Signed)
PRINTED FOR PASS PROGRAM 

## 2016-09-22 MED FILL — XARELTO 20 MG TABLET: 20 | 3 days supply | Qty: 3 | Fill #1

## 2016-09-29 MED FILL — XARELTO 20 MG TABLET: 20 | 30 days supply | Qty: 30 | Fill #2

## 2016-10-05 ENCOUNTER — Ambulatory Visit: Payer: Medicaid Other | Admitting: Internal Medicine

## 2016-10-23 ENCOUNTER — Encounter: Payer: Self-pay | Admitting: Internal Medicine

## 2016-10-23 ENCOUNTER — Ambulatory Visit: Payer: Self-pay | Attending: Internal Medicine | Admitting: Internal Medicine

## 2016-10-23 VITALS — BP 128/85 | HR 100 | Temp 98.0°F | Resp 16 | Wt 305.6 lb

## 2016-10-23 DIAGNOSIS — I10 Essential (primary) hypertension: Secondary | ICD-10-CM | POA: Insufficient documentation

## 2016-10-23 DIAGNOSIS — Z6841 Body Mass Index (BMI) 40.0 and over, adult: Secondary | ICD-10-CM | POA: Insufficient documentation

## 2016-10-23 DIAGNOSIS — Z86711 Personal history of pulmonary embolism: Secondary | ICD-10-CM | POA: Insufficient documentation

## 2016-10-23 DIAGNOSIS — Z7901 Long term (current) use of anticoagulants: Secondary | ICD-10-CM | POA: Insufficient documentation

## 2016-10-23 DIAGNOSIS — E876 Hypokalemia: Secondary | ICD-10-CM | POA: Insufficient documentation

## 2016-10-23 MED ORDER — POTASSIUM CHLORIDE ER 20 MEQ PO TBCR: 20.0000 meq | EXTENDED_RELEASE_TABLET | Freq: Every day | ORAL | 4 refills | Status: DC

## 2016-10-23 NOTE — Patient Instructions (Signed)
I have sent the prescription for potassium to your pharmacy but please hold off on starting it until I get your lab results back from today.  I will call you.   Follow a Healthy Eating Plan - You can do it! Limit sugary drinks.  Avoid sodas, sweet tea, sport or energy drinks, or fruit drinks.  Drink water, lo-fat milk, or diet drinks. Limit snack foods.   Cut back on candy, cake, cookies, chips, ice cream.  These are a special treat, only in small amounts. Eat plenty of vegetables.  Especially dark green, red, and orange vegetables. Aim for at least 3 servings a day. More is better! Include fruit in your daily diet.  Whole fruit is much healthier than fruit juice! Limit "white" bread, "white" pasta, "white" rice.   Choose "100% whole grain" products, brown or wild rice. Avoid fatty meats. Try "Meatless Monday" and choose eggs or beans one day a week.  When eating meat, choose lean meats like chicken, [place], and fish.  Grill, broil, or bake meats instead of frying, and eat poultry without the skin. Eat less salt.  Avoid frozen pizzas, frozen dinners and salty foods.  Use seasonings other than salt in cooking.  This can help blood pressure and keep you from swelling Beer, wine and liquor have calories.  If you can safely drink alcohol, limit to 1 drink per day for women, 2 drinks for men

## 2016-10-23 NOTE — Progress Notes (Signed)
Patient ID: Karen Robles, female    DOB: 1980-09-28  MRN: [id number]  CC: re-establish and Hypertension   Subjective: Karen Robles is a 36 y.o. 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  1. Hx of PE:  -no bleeding or bruising on Xarelto -Patient states she was told that she would need to be on anticoagulation lifelong  2. HTN: -Compliant with HCTZ. -K low on last visit.. Pt was called and told to start potassium supplement and have repeat BMP in 1 week. However patient states that she does not recall receiving this message and was unaware of the potassium issue or the prescribed potassium medication   Denies any cramps, no palpitations.  -No chest pains or shortness of breath  3.  Obesity -Patient admits that she isn't not doing nothing in terms of exercise -eating a lot of junk and fast foods-  Patient Active Problem List   Diagnosis Date Noted  . Surveillance of contraceptive injection 06/11/2016  . Acute pulmonary embolism (HCC) 07/26/2013  . Severe obesity (BMI >= 40) (HCC) 07/26/2013  . Chlamydia trachomatis infection of lower genitourinary sites 05/25/2013  . Essential hypertension, benign 03/07/2013  . Chlamydia infection 02/11/2013     Current Outpatient Prescriptions on File Prior to Visit  Medication Sig Dispense Refill  . acetaminophen (TYLENOL) 500 MG tablet Take 1,000 mg by mouth every 6 (six) hours as needed for mild pain.    . hydrochlorothiazide (HYDRODIURIL) 25 MG tablet Take 1 tablet (25 mg total) by mouth daily. 90 tablet 2  . medroxyPROGESTERone (DEPO-PROVERA) 150 MG/ML injection Inject 1 mL (150 mg total) into the muscle every 3 (three) months. 1 mL 3  . rivaroxaban (XARELTO) 20 MG TABS tablet TAKE ONE TABLET BY MOUTH ONCE DAILY WITH SUPPER 90 tablet 3   No current facility-administered medications on file prior to visit.     No Known Allergies  Social  History   Social History  . Marital status: Single    Spouse name: N/A  . Number of children: 2  . Years of education: N/A   Occupational History  . Unemployed    Social History Main Topics  . Smoking status: Never Smoker  . Smokeless tobacco: Never Used  . Alcohol use No  . Drug use: No  . Sexual activity: Yes    Partners: Male    Birth control/ protection: Injection   Other Topics Concern  . Not on file   Social History Narrative  . No narrative on file    Family History  Problem Relation Age of Onset  . Hypertension Mother        maternal aunts & uncles  . Hyperlipidemia Mother   . Healthy Brother   . Hypertension Maternal Aunt   . Hypertension Maternal Uncle   . Cancer Maternal Grandmother   . Diabetes Neg Hx   . Heart disease Neg Hx     Past Surgical History:  Procedure Laterality Date  . MOUTH SURGERY    . WISDOM TOOTH EXTRACTION Bilateral     ROS: Review of Systems Negative except as stated above PHYSICAL EXAM: BP 128/85   Pulse 100   Temp 98 F (36.7 C) (Oral)   Resp 16   Wt (!) 305 lb 9.6 oz (138.6 kg)   SpO2 97%   BMI 54.13 kg/m   Wt Readings from Last 3 Encounters:  10/23/16 (!) 305  lb 9.6 oz (138.6 kg)  04/14/16 298 lb 12.8 oz (135.5 kg)  12/05/15 (!) 300 lb 1.6 oz (136.1 kg)    Physical Exam General appearance - alert, well appearing, morbidly obese African-American female and in no distress Mental status - alert, oriented to person, place, and time, normal mood, behavior, speech, dress, motor activity, and thought processes Neck - supple, no significant adenopathy Chest - clear to auscultation, no wheezes, rales or rhonchi, symmetric air entry Heart - normal rate, regular rhythm, normal S1, S2, no murmurs, rubs, clicks or gallops Extremities - peripheral pulses normal, no pedal edema, no clubbing or cyanosis  ASSESSMENT AND PLAN: 1. Essential hypertension, benign At goal. I went ahead and send a prescription to her pharmacy for  potassium. She will hold off on getting it filled until she hears back from me with the results for blood work. DASH diet and encouraged - Basic Metabolic Panel  2. History of pulmonary embolism -Patient in process of applying for [facility] card. 1 she has that we will send her to a hematologist for second opinion of whether she needs to be on lifelong anticoagulation  3. Severe obesity (BMI >= 40) (HCC) -Patient counseled extensively on dietary changes. Also given printed information. -Encourage regular aerobic exercise a total of 150 minutes per week  4. Hypokalemia - potassium chloride 20 MEQ TBCR; Take 20 mEq by mouth daily.  Dispense: 30 tablet; Refill: 4 - Basic Metabolic Panel .  Patient was given the opportunity to ask questions.  Patient verbalized understanding of the plan and was able to repeat key elements of the plan.   Orders Placed This Encounter  Procedures  . Basic Metabolic Panel     Requested Prescriptions   Signed Prescriptions Disp Refills  . potassium chloride 20 MEQ TBCR 30 tablet 4    Sig: Take 20 mEq by mouth daily.    Return in about 4 months (around 02/23/2017).  [name], MD, FACP

## 2016-10-24 LAB — BASIC METABOLIC PANEL
BUN / CREAT RATIO: 15 (ref 9–23)
BUN: 18 mg/dL (ref 6–20)
CO2: 23 mmol/L (ref 20–29)
Calcium: 9 mg/dL (ref 8.7–10.2)
Chloride: 103 mmol/L (ref 96–106)
Creatinine, Ser: 1.19 mg/dL — ABNORMAL HIGH (ref 0.57–1.00)
GFR, EST AFRICAN AMERICAN: 69 mL/min/{1.73_m2} (ref >59)
GFR, EST NON AFRICAN AMERICAN: 60 mL/min/{1.73_m2} (ref >59)
Glucose: 83 mg/dL (ref 65–99)
POTASSIUM: 3.5 mmol/L (ref 3.5–5.2)
SODIUM: 140 mmol/L (ref 134–144)

## 2016-10-28 ENCOUNTER — Ambulatory Visit: Payer: Medicaid Other

## 2016-10-29 MED FILL — XARELTO 20 MG TABLET: 20 | 30 days supply | Qty: 30 | Fill #0

## 2016-11-24 ENCOUNTER — Ambulatory Visit (INDEPENDENT_AMBULATORY_CARE_PROVIDER_SITE_OTHER): Payer: Medicaid Other

## 2016-11-24 DIAGNOSIS — Z3049 Encounter for surveillance of other contraceptives: Secondary | ICD-10-CM

## 2016-11-24 DIAGNOSIS — Z3042 Encounter for surveillance of injectable contraceptive: Secondary | ICD-10-CM

## 2016-11-24 MED ORDER — MEDROXYPROGESTERONE ACETATE 150 MG/ML IM SUSP
150.0000 mg | Freq: Once | INTRAMUSCULAR | Status: AC
  Administered 2016-11-24: 150 mg via INTRAMUSCULAR

## 2016-11-24 NOTE — Progress Notes (Addendum)
Nurse visit for Depo given R Del w/o difficulty. Pt supply. Next Depo due 11/6-11/20 pt agrees. Pt needs Annual and will schedule at check out today.

## 2016-11-27 ENCOUNTER — Ambulatory Visit: Payer: Medicaid Other

## 2016-11-27 MED FILL — XARELTO 20 MG TABLET: 20 | 30 days supply | Qty: 30 | Fill #1

## 2016-12-11 ENCOUNTER — Other Ambulatory Visit (HOSPITAL_COMMUNITY)
Admission: RE | Admit: 2016-12-11 | Discharge: 2016-12-11 | Disposition: A | Discharge status: Home / Self Care | Payer: Medicaid Other | Source: Ambulatory Visit | Attending: Obstetrics | Admitting: Obstetrics

## 2016-12-11 ENCOUNTER — Ambulatory Visit (INDEPENDENT_AMBULATORY_CARE_PROVIDER_SITE_OTHER): Payer: Medicaid Other | Admitting: Obstetrics

## 2016-12-11 ENCOUNTER — Encounter: Payer: Self-pay | Admitting: Obstetrics

## 2016-12-11 VITALS — BP 158/93 | HR 103 | Wt 304.0 lb

## 2016-12-11 DIAGNOSIS — Z3042 Encounter for surveillance of injectable contraceptive: Secondary | ICD-10-CM

## 2016-12-11 DIAGNOSIS — N898 Other specified noninflammatory disorders of vagina: Secondary | ICD-10-CM

## 2016-12-11 DIAGNOSIS — Z01419 Encounter for gynecological examination (general) (routine) without abnormal findings: Secondary | ICD-10-CM | POA: Insufficient documentation

## 2016-12-11 DIAGNOSIS — Z3049 Encounter for surveillance of other contraceptives: Secondary | ICD-10-CM | POA: Diagnosis not present

## 2016-12-11 MED ORDER — MEDROXYPROGESTERONE ACETATE 150 MG/ML IM SUSP: 150.0000 mg | INTRAMUSCULAR | 3 refills | Status: DC

## 2016-12-11 NOTE — Progress Notes (Signed)
Patient is in the office for annual exam. Patient reports she is doing well.

## 2016-12-11 NOTE — Progress Notes (Signed)
Subjective:        Karen DriverShannon L Robles is a 36 y.o. female here for a routine exam.  Current complaints: None.    Personal health questionnaire:  Is patient Ashkenazi Jewish, have a family history of breast and/or ovarian cancer: no Is there a family history of uterine cancer diagnosed at age < 8650, gastrointestinal cancer, urinary tract cancer, family member who is a Personnel officerLynch syndrome-associated carrier: no Is the patient overweight and hypertensive, family history of diabetes, personal history of gestational diabetes, preeclampsia or PCOS: no Is patient over 2655, have PCOS,  family history of premature CHD under age 34, diabetes, smoke, have hypertension or peripheral artery disease:  no At any time, has a partner hit, kicked or otherwise hurt or frightened you?: no Over the past 2 weeks, have you felt down, depressed or hopeless?: no Over the past 2 weeks, have you felt little interest or pleasure in doing things?:no   Gynecologic History No LMP recorded. Patient has had an injection. Contraception: Depo-Provera injections Last Pap: 2017. Results were: normal Last mammogram: n/a. Results were: n/a  Obstetric History OB History  Gravida Para Term Preterm AB Living  2 2 2     2   SAB TAB Ectopic Multiple Live Births          2    # Outcome Date GA Lbr Len/2nd Weight Sex Delivery Anes PTL Lv  2 Term 2012 [id number]   F    LIV  1 Term 2004   6 lb 4 oz (2.835 kg) F    LIV      Past Medical History:  Diagnosis Date  . Hypertension   . Obesity   . Pulmonary embolism [facility](HCC)     Past Surgical History:  Procedure Laterality Date  . MOUTH SURGERY    . WISDOM TOOTH EXTRACTION Bilateral      Current Outpatient Prescriptions:  .  acetaminophen (TYLENOL) 500 MG tablet, Take 1,000 mg by mouth every 6 (six) hours as needed for mild pain., Disp: , Rfl:  .  hydrochlorothiazide (HYDRODIURIL) 25 MG tablet, Take 1 tablet (25 mg total) by mouth daily., Disp: 90 tablet, Rfl: 2 .   medroxyPROGESTERone (DEPO-PROVERA) 150 MG/ML injection, Inject 1 mL (150 mg total) into the muscle every 3 (three) months., Disp: 1 mL, Rfl: 3 .  rivaroxaban (XARELTO) 20 MG TABS tablet, TAKE ONE TABLET BY MOUTH ONCE DAILY WITH SUPPER, Disp: 90 tablet, Rfl: 3 .  potassium chloride 20 MEQ TBCR, Take 20 mEq by mouth daily. (Patient not taking: Reported on 12/11/2016), Disp: 30 tablet, Rfl: 4 No Known Allergies  Social History  Substance Use Topics  . Smoking status: Never Smoker  . Smokeless tobacco: Never Used  . Alcohol use No    Family History  Problem Relation Age of Onset  . Hypertension Mother        maternal aunts & uncles  . Hyperlipidemia Mother   . Healthy Brother   . Hypertension Maternal Aunt   . Hypertension Maternal Uncle   . Cancer Maternal Grandmother   . Diabetes Neg Hx   . Heart disease Neg Hx       Review of Systems  Constitutional: negative for fatigue and weight loss Respiratory: negative for cough and wheezing Cardiovascular: negative for chest pain, fatigue and palpitations Gastrointestinal: negative for abdominal pain and change in bowel habits Musculoskeletal:negative for myalgias Neurological: negative for gait problems and tremors Behavioral/Psych: negative for abusive relationship, depression Endocrine: negative for temperature intolerance  Genitourinary:negative for abnormal menstrual periods, genital lesions, hot flashes, sexual problems and vaginal discharge Integument/breast: negative for breast lump, breast tenderness, nipple discharge and skin lesion(s)    Objective:       BP (!) 158/93   Pulse (!) 103   Wt (!) 304 lb (137.9 kg)   BMI 53.85 kg/m  General:   alert  Skin:   no rash or abnormalities  Lungs:   clear to auscultation bilaterally  Heart:   regular rate and rhythm, S1, S2 normal, no murmur, click, rub or gallop  Breasts:   normal without suspicious masses, skin or nipple changes or axillary nodes  Abdomen:  normal findings:  no organomegaly, soft, non-tender and no hernia  Pelvis:  External genitalia: normal general appearance Urinary system: urethral meatus normal and bladder without fullness, nontender Vaginal: normal without tenderness, induration or masses Cervix: normal appearance Adnexa: normal bimanual exam Uterus: anteverted and non-tender, normal size   Lab Review Urine pregnancy test Labs reviewed yes Radiologic studies reviewed no  50% of 20 min visit spent on counseling and coordination of care.    Assessment:     1. Encounter for routine gynecological examination with Papanicolaou smear of cervix Rx: - Cytology - PAP  2. Surveillance of contraceptive injection - pleased with Depo  3. Encounter for surveillance of injectable contraceptive Rx: - medroxyPROGESTERone (DEPO-PROVERA) 150 MG/ML injection; Inject 1 mL (150 mg total) into the muscle every 3 (three) months.  Dispense: 1 mL; Refill: 3  4. Vaginal discharge Rx: - Cervicovaginal ancillary only   Plan:    Education reviewed: calcium supplements, depression evaluation, low fat, low cholesterol diet, safe sex/STD prevention, self breast exams and weight bearing exercise. Contraception: Depo-Provera injections. Follow up in: 1 year.   No orders of the defined types were placed in this encounter.  No orders of the defined types were placed in this encounter.

## 2016-12-15 LAB — CERVICOVAGINAL ANCILLARY ONLY
Chlamydia: NEGATIVE
NEISSERIA GONORRHEA: NEGATIVE

## 2016-12-15 LAB — CYTOLOGY - PAP
Diagnosis: NEGATIVE
HPV: NOT DETECTED

## 2016-12-30 MED FILL — XARELTO 20 MG TABLET: 20 | 30 days supply | Qty: 30 | Fill #2

## 2017-01-27 MED FILL — XARELTO 20 MG TABLET: 20 | 30 days supply | Qty: 30 | Fill #3

## 2017-02-15 ENCOUNTER — Ambulatory Visit (INDEPENDENT_AMBULATORY_CARE_PROVIDER_SITE_OTHER): Payer: Medicaid Other | Admitting: Pediatrics

## 2017-02-15 DIAGNOSIS — Z309 Encounter for contraceptive management, unspecified: Secondary | ICD-10-CM

## 2017-02-15 DIAGNOSIS — Z3042 Encounter for surveillance of injectable contraceptive: Secondary | ICD-10-CM

## 2017-02-15 MED ORDER — MEDROXYPROGESTERONE ACETATE 150 MG/ML IM SUSP
150.0000 mg | Freq: Once | INTRAMUSCULAR | Status: AC
  Administered 2017-02-15: 150 mg via INTRAMUSCULAR

## 2017-02-15 NOTE — Progress Notes (Signed)
Patient seen by RN for depo-provera 

## 2017-03-02 MED FILL — XARELTO 20 MG TABLET: 20 | 30 days supply | Qty: 30 | Fill #4

## 2017-04-08 MED FILL — XARELTO 20 MG TABLET: 20 | 30 days supply | Qty: 30 | Fill #5

## 2017-05-10 ENCOUNTER — Ambulatory Visit (INDEPENDENT_AMBULATORY_CARE_PROVIDER_SITE_OTHER): Payer: Medicaid Other

## 2017-05-10 DIAGNOSIS — Z309 Encounter for contraceptive management, unspecified: Secondary | ICD-10-CM

## 2017-05-10 DIAGNOSIS — Z3042 Encounter for surveillance of injectable contraceptive: Secondary | ICD-10-CM

## 2017-05-10 MED ORDER — MEDROXYPROGESTERONE ACETATE 150 MG/ML IM SUSP
150.0000 mg | Freq: Once | INTRAMUSCULAR | Status: AC
  Administered 2017-05-10: 150 mg via INTRAMUSCULAR

## 2017-05-10 MED FILL — XARELTO 20 MG TABLET: 20 | 30 days supply | Qty: 30 | Fill #6

## 2017-05-10 NOTE — Progress Notes (Signed)
I have reviewed the chart and agree with nursing staff's documentation of this patient's encounter.  Karen AntiguaPeggy Emmerich Cryer, MD 05/10/2017 1:54 PM

## 2017-05-10 NOTE — Progress Notes (Signed)
Pt here for depo shot. Inj given in right deltoid. Pt tolerated well. Next depo due April 22 through May 6.

## 2017-06-09 MED FILL — XARELTO 20 MG TABLET: 20 | 30 days supply | Qty: 30 | Fill #7

## 2017-06-21 ENCOUNTER — Other Ambulatory Visit: Payer: Self-pay | Admitting: Pharmacist

## 2017-06-21 MED ORDER — HYDROCHLOROTHIAZIDE 25 MG PO TABS: 25.0000 mg | ORAL_TABLET | Freq: Every day | ORAL | 0 refills | Status: DC

## 2017-07-08 MED FILL — XARELTO 20 MG TABLET: 20 | 30 days supply | Qty: 30 | Fill #8

## 2017-07-22 ENCOUNTER — Encounter: Payer: Self-pay | Admitting: Internal Medicine

## 2017-07-22 ENCOUNTER — Ambulatory Visit: Payer: Self-pay | Attending: Internal Medicine | Admitting: Internal Medicine

## 2017-07-22 DIAGNOSIS — I10 Essential (primary) hypertension: Secondary | ICD-10-CM | POA: Insufficient documentation

## 2017-07-22 DIAGNOSIS — Z6841 Body Mass Index (BMI) 40.0 and over, adult: Secondary | ICD-10-CM | POA: Insufficient documentation

## 2017-07-22 DIAGNOSIS — Z7901 Long term (current) use of anticoagulants: Secondary | ICD-10-CM | POA: Insufficient documentation

## 2017-07-22 DIAGNOSIS — Z86711 Personal history of pulmonary embolism: Secondary | ICD-10-CM | POA: Insufficient documentation

## 2017-07-22 MED ORDER — POTASSIUM CHLORIDE ER 20 MEQ PO TBCR: 20.0000 meq | EXTENDED_RELEASE_TABLET | Freq: Every day | ORAL | 4 refills | Status: DC

## 2017-07-22 MED ORDER — HYDROCHLOROTHIAZIDE 25 MG PO TABS: 25.0000 mg | ORAL_TABLET | Freq: Every day | ORAL | 0 refills | Status: DC

## 2017-07-22 NOTE — Progress Notes (Signed)
Patient ID: [name], female    DOB: [birth year]  MRN: [id number]  CC: Medication Refill   Subjective: Karen Robles is a 37 y.o. female who presents for chronic ds management. Last seen 10/2016. 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  1.  HTN: needs RF on HCTZ.  Not taking K+ supplement as prescribed.  2.  Hx of PE:  Taking Xarelto consistently.  Occasional bruising on leg  3.  Obesity:  "I'm trying, I really am."  She has cut back on eating sweets.  She drinks sodas and juices.  Active at home and work. Walks 1 block to and from school to get her daughter.  -on Depo since 2012.  This caused some wgh gain for her Patient Active Problem List   Diagnosis Date Noted  . Surveillance of contraceptive injection 06/11/2016  . Acute pulmonary embolism (HCC) 07/26/2013  . Severe obesity (BMI >= 40) (HCC) 07/26/2013  . Chlamydia trachomatis infection of lower genitourinary sites 05/25/2013  . Essential hypertension, benign 03/07/2013  . Chlamydia infection 02/11/2013     Current Outpatient Medications on File Prior to Visit  Medication Sig Dispense Refill  . acetaminophen (TYLENOL) 500 MG tablet Take 1,000 mg by mouth every 6 (six) hours as needed for mild pain.    . medroxyPROGESTERone (DEPO-PROVERA) 150 MG/ML injection Inject 1 mL (150 mg total) into the muscle every 3 (three) months. 1 mL 3  . rivaroxaban (XARELTO) 20 MG TABS tablet TAKE ONE TABLET BY MOUTH ONCE DAILY WITH SUPPER 90 tablet 3   No current facility-administered medications on file prior to visit.     No Known Allergies  Social History   Socioeconomic History  . Marital status: Single    Spouse name: Not on file  . Number of children: 2  . Years of education: Not on file  . Highest education level: Not on file  Occupational History  . Occupation: Unemployed  Social Needs  . Financial resource strain: Not on file  . Food insecurity:    Worry: Not on  file    Inability: Not on file  . Transportation needs:    Medical: Not on file    Non-medical: Not on file  Tobacco Use  . Smoking status: Never Smoker  . Smokeless tobacco: Never Used  Substance and Sexual Activity  . Alcohol use: No    Alcohol/week: 0.0 oz  . Drug use: No  . Sexual activity: Yes    Partners: Male    Birth control/protection: Injection  Lifestyle  . Physical activity:    Days per week: Not on file    Minutes per session: Not on file  . Stress: Not on file  Relationships  . Social connections:    Talks on phone: Not on file    Gets together: Not on file    Attends religious service: Not on file    Active member of club or organization: Not on file    Attends meetings of clubs or organizations: Not on file    Relationship status: Not on file  . Intimate partner violence:    Fear of current or ex partner: Not on file    Emotionally abused: Not on file    Physically abused: Not on file    Forced sexual activity: Not on file  Other Topics Concern  . Not on file  Social History Narrative  . Not on file  Family History  Problem Relation Age of Onset  . Hypertension Mother        maternal aunts & uncles  . Hyperlipidemia Mother   . Healthy Brother   . Hypertension Maternal Aunt   . Hypertension Maternal Uncle   . Cancer Maternal Grandmother   . Diabetes Neg Hx   . Heart disease Neg Hx     Past Surgical History:  Procedure Laterality Date  . MOUTH SURGERY    . WISDOM TOOTH EXTRACTION Bilateral     ROS: Review of Systems Negative except as stated above PHYSICAL EXAM: BP 114/82   Pulse 91   Temp 98.3 F (36.8 C) (Oral)   Resp 16   Wt (!) 310 lb (140.6 kg)   SpO2 100%   BMI 54.91 kg/m   Wt Readings from Last 3 Encounters:  07/22/17 (!) 310 lb (140.6 kg)  12/11/16 (!) 304 lb (137.9 kg)  10/23/16 (!) 305 lb 9.6 oz (138.6 kg)    Physical Exam General appearance - alert, well appearing, and in no distress Mental status - alert,  oriented to person, place, and time, normal mood, behavior, speech, dress, motor activity, and thought processes Mouth - mucous membranes moist, pharynx normal without lesions Chest - clear to auscultation, no wheezes, rales or rhonchi, symmetric air entry Heart - normal rate, regular rhythm, normal S1, S2, no murmurs, rubs, clicks or gallops Extremities - peripheral pulses normal, no pedal edema, no clubbing or cyanosis  ASSESSMENT AND PLAN: 1. Essential hypertension At goal.  Continue HCTZ.  Encourage patient to take the potassium supplement with it as HCTZ can cause potassium loss in the urine.  She expressed understanding. - Potassium Chloride ER 20 MEQ TBCR; Take 20 mEq by mouth daily.  Dispense: 30 tablet; Refill: 4 - hydrochlorothiazide (HYDRODIURIL) 25 MG tablet; Take 1 tablet (25 mg total) by mouth daily.  Dispense: 30 tablet; Refill: 0 - CBC - Basic metabolic panel  2. Severe obesity (BMI >= 40) (HCC) -Commended her on trying to move more.  Advised trying to get in at least 30 minutes of exercise 4 times a week.  Dietary counseling given.  Recommended drinking more water and avoid sodas and juices.  Teacher, English as a foreign language given. -Discuss other birth control options like copper IUD that is not associated with significant weight gain as Depo.  Patient will think about it and let me know if she wishes to proceed.  3. History of pulmonary embolism Continue Xarelto.  Will check CBC and BMP today.  Patient was given the opportunity to ask questions.  Patient verbalized understanding of the plan and was able to repeat key elements of the plan.   Orders Placed This Encounter  Procedures  . CBC  . Basic metabolic panel     Requested Prescriptions   Signed Prescriptions Disp Refills  . Potassium Chloride ER 20 MEQ TBCR 30 tablet 4    Sig: Take 20 mEq by mouth daily.  . hydrochlorothiazide (HYDRODIURIL) 25 MG tablet 30 tablet 0    Sig: Take 1 tablet (25 mg total) by mouth daily.     Return in about 6 months (around 01/21/2018).  [name], MD, FACP

## 2017-07-22 NOTE — Patient Instructions (Addendum)
Follow a Healthy Eating Plan - You can do it! Limit sugary drinks.  Avoid sodas, sweet tea, sport or energy drinks, or fruit drinks.  Drink water, lo-fat milk, or diet drinks. Limit snack foods.   Cut back on candy, cake, cookies, chips, ice cream.  These are a special treat, only in small amounts. Eat plenty of vegetables.  Especially dark green, red, and orange vegetables. Aim for at least 3 servings a day. More is better! Include fruit in your daily diet.  Whole fruit is much healthier than fruit juice! Limit "white" bread, "white" pasta, "white" rice.   Choose "100% whole grain" products, brown or wild rice. Avoid fatty meats. Try "Meatless Monday" and choose eggs or beans one day a week.  When eating meat, choose lean meats like chicken, [place]key, and fish.  Grill, broil, or bake meats instead of frying, and eat poultry without the skin. Eat less salt.  Avoid frozen pizzas, frozen dinners and salty foods.  Use seasonings other than salt in cooking.  This can help blood pressure and keep you from swelling Beer, wine and liquor have calories.  If you can safely drink alcohol, limit to 1 drink per day for women, 2 drinks for men  Try to do some form of aerobic exercise 4 times a week for 30 minutes.

## 2017-07-23 LAB — BASIC METABOLIC PANEL WITH GFR
BUN/Creatinine Ratio: 19 (ref 9–23)
BUN: 20 mg/dL (ref 6–20)
CO2: 24 mmol/L (ref 20–29)
Calcium: 9.1 mg/dL (ref 8.7–10.2)
Chloride: 100 mmol/L (ref 96–106)
Creatinine, Ser: 1.04 mg/dL — ABNORMAL HIGH (ref 0.57–1.00)
GFR calc Af Amer: 81 mL/min/1.73
GFR calc non Af Amer: 70 mL/min/1.73
Glucose: 62 mg/dL — ABNORMAL LOW (ref 65–99)
Potassium: 3.1 mmol/L — ABNORMAL LOW (ref 3.5–5.2)
Sodium: 140 mmol/L (ref 134–144)

## 2017-07-23 LAB — CBC
Hematocrit: 38.5 % (ref 34.0–46.6)
Hemoglobin: 12.7 g/dL (ref 11.1–15.9)
MCH: 26.7 pg (ref 26.6–33.0)
MCHC: 33 g/dL (ref 31.5–35.7)
MCV: 81 fL (ref 79–97)
Platelets: 386 x10E3/uL — ABNORMAL HIGH (ref 150–379)
RBC: 4.75 x10E6/uL (ref 3.77–5.28)
RDW: 14.4 % (ref 12.3–15.4)
WBC: 8.7 x10E3/uL (ref 3.4–10.8)

## 2017-07-26 ENCOUNTER — Telehealth: Payer: Self-pay | Admitting: Internal Medicine

## 2017-07-26 ENCOUNTER — Telehealth: Payer: Self-pay

## 2017-07-26 NOTE — Telephone Encounter (Signed)
Patient returned nurse call regarding lab results. Please f/u  °

## 2017-07-26 NOTE — Telephone Encounter (Signed)
Contacted pt to go over lab results pt didn't answer lvm asking pt to give me a call at her earliest convenience  

## 2017-07-26 NOTE — Telephone Encounter (Signed)
-----   Message from Marcine Matareborah B Johnson, MD sent at 07/23/2017 11:19 AM EDT ----- Let pt know that her blood count is normal.  Potassium level is low.  Please take the potassium supplement as prescribed.  Blood sugar was low.  This can be seen if blood sample sat in the lab for a while before it is tested.  We can recheck BS on f/u visit.

## 2017-07-27 NOTE — Telephone Encounter (Signed)
Returned pt call and went over lab results and pt doesn't have any questions or concerns

## 2017-08-02 ENCOUNTER — Ambulatory Visit: Payer: Medicaid Other

## 2017-08-03 ENCOUNTER — Ambulatory Visit (INDEPENDENT_AMBULATORY_CARE_PROVIDER_SITE_OTHER): Payer: Medicaid Other

## 2017-08-03 DIAGNOSIS — Z3042 Encounter for surveillance of injectable contraceptive: Secondary | ICD-10-CM

## 2017-08-03 MED ORDER — MEDROXYPROGESTERONE ACETATE 150 MG/ML IM SUSP
150.0000 mg | Freq: Once | INTRAMUSCULAR | Status: AC
  Administered 2017-08-03: 150 mg via INTRAMUSCULAR

## 2017-08-03 NOTE — Progress Notes (Signed)
Nurse visit for pt supplied Depo given R Del w/o difficulty. Next Depo due Jul 16-30, pt agrees.

## 2017-08-10 MED FILL — XARELTO 20 MG TABLET: 20 | 30 days supply | Qty: 30 | Fill #9

## 2017-08-24 ENCOUNTER — Other Ambulatory Visit: Payer: Self-pay

## 2017-08-24 ENCOUNTER — Telehealth: Payer: Self-pay | Admitting: Internal Medicine

## 2017-08-24 DIAGNOSIS — I10 Essential (primary) hypertension: Secondary | ICD-10-CM

## 2017-08-24 MED ORDER — HYDROCHLOROTHIAZIDE 25 MG PO TABS: 25.0000 mg | ORAL_TABLET | Freq: Every day | ORAL | 2 refills | Status: DC

## 2017-08-24 NOTE — Telephone Encounter (Signed)
rx sent to pt pharmacy 

## 2017-08-24 NOTE — Telephone Encounter (Signed)
Patient called stating that she was prescribed hydrochlorothiazide (HYDRODIURIL) 25 MG tablet  On 07/22/17. Patient states that she needs a refill on the medication because she is out. On patients chart it states that she needs an OV before she can receive a refill. Patient stated that she was not told that she needed an OV. Please f/u with patient.   Pharmacy:  [facility] on [address]

## 2017-09-07 MED FILL — XARELTO 20 MG TABLET: 20 | 30 days supply | Qty: 30 | Fill #10

## 2017-10-12 ENCOUNTER — Other Ambulatory Visit: Payer: Self-pay

## 2017-10-12 DIAGNOSIS — Z86711 Personal history of pulmonary embolism: Secondary | ICD-10-CM

## 2017-10-12 NOTE — Telephone Encounter (Signed)
Noted to continue Xarelto in 07/22/17 note. H/H WNL on last CBC. Renal function okay for Xarelto. Wanted to run this by you because current prescription was written over a year ago.

## 2017-10-13 MED ORDER — RIVAROXABAN 20 MG PO TABS: ORAL_TABLET | ORAL | 1 refills | Status: DC

## 2017-10-13 MED FILL — XARELTO 20 MG TABLET: 20 | 30 days supply | Qty: 30 | Fill #0

## 2017-10-13 NOTE — Addendum Note (Signed)
Addended by: Lois HuxleyVAN AUSDALL, Jeannett SeniorSTEPHEN L on: 10/13/2017 01:40 PM   Modules accepted: Orders

## 2017-10-13 NOTE — Telephone Encounter (Signed)
Refill request approved for Xarelto #90 with 1 refill. Ordered as a printed prescription. Re-sent as e-script.

## 2017-10-15 LAB — GLUCOSE, POCT (MANUAL RESULT ENTRY): POC Glucose: 132 mg/dl — AB (ref 70–99)

## 2017-10-26 ENCOUNTER — Ambulatory Visit (INDEPENDENT_AMBULATORY_CARE_PROVIDER_SITE_OTHER): Payer: Medicaid Other

## 2017-10-26 DIAGNOSIS — Z3042 Encounter for surveillance of injectable contraceptive: Secondary | ICD-10-CM | POA: Diagnosis not present

## 2017-10-26 MED ORDER — MEDROXYPROGESTERONE ACETATE 150 MG/ML IM SUSP
150.0000 mg | Freq: Once | INTRAMUSCULAR | Status: AC
  Administered 2017-10-26: 150 mg via INTRAMUSCULAR

## 2017-10-26 NOTE — Progress Notes (Addendum)
Nurse visit for pt supplied Depo given R Del w/o difficulty. Pt is on time for inj. Next Depo due 10/8-10/22. Pap UTD.   Attestation of Attending Supervision of RN: Evaluation and management procedures were performed by the nurse under my supervision and collaboration.  I have reviewed the nursing note and chart, and I agree with the management and plan.  Carolyn L. Harraway-Smith, M.D., Evern CoreFACOG

## 2017-11-15 MED FILL — XARELTO 20 MG TABLET: 20 | 30 days supply | Qty: 30 | Fill #1

## 2017-11-22 ENCOUNTER — Other Ambulatory Visit: Payer: Self-pay | Admitting: Internal Medicine

## 2017-11-22 DIAGNOSIS — I10 Essential (primary) hypertension: Secondary | ICD-10-CM

## 2017-12-13 ENCOUNTER — Ambulatory Visit: Payer: Medicaid Other | Admitting: Obstetrics

## 2017-12-15 MED FILL — XARELTO 20 MG TABLET: 20 | 30 days supply | Qty: 30 | Fill #2

## 2017-12-24 ENCOUNTER — Other Ambulatory Visit: Payer: Self-pay | Admitting: Internal Medicine

## 2017-12-24 DIAGNOSIS — I10 Essential (primary) hypertension: Secondary | ICD-10-CM

## 2017-12-27 ENCOUNTER — Telehealth: Payer: Self-pay | Admitting: Internal Medicine

## 2017-12-27 NOTE — Telephone Encounter (Signed)
1) Medication(s) Requested (by name): hydrochlorothiazide (HYDRODIURIL) 25 MG tablet [[id number]][[id number]]   2) Pharmacy of Choice: [id number] - [place] ([place]), [place] - [address] 3) Special Requests: Patient stated she was gonna call back to make appointment Approved medications will be sent to the pharmacy, we will reach out if there is an issue.  Requests made after 3pm may not be addressed until the following business day!  If a patient is unsure of the name of the medication(s) please note and ask patient to call back when they are able to provide all info, do not send to responsible party until all information is available!

## 2017-12-27 NOTE — Telephone Encounter (Signed)
Pt needs to be seen before refills can be approved.

## 2017-12-28 ENCOUNTER — Encounter: Payer: Self-pay | Admitting: Obstetrics

## 2017-12-28 ENCOUNTER — Ambulatory Visit (INDEPENDENT_AMBULATORY_CARE_PROVIDER_SITE_OTHER): Payer: Medicaid Other | Admitting: Obstetrics

## 2017-12-28 ENCOUNTER — Other Ambulatory Visit (HOSPITAL_COMMUNITY)
Admission: RE | Admit: 2017-12-28 | Discharge: 2017-12-28 | Disposition: A | Discharge status: Home / Self Care | Payer: Medicaid Other | Source: Ambulatory Visit | Attending: Obstetrics | Admitting: Obstetrics

## 2017-12-28 VITALS — BP 130/90 | HR 81 | Ht 63.0 in | Wt 308.0 lb

## 2017-12-28 DIAGNOSIS — Z01419 Encounter for gynecological examination (general) (routine) without abnormal findings: Secondary | ICD-10-CM | POA: Insufficient documentation

## 2017-12-28 DIAGNOSIS — Z0000 Encounter for general adult medical examination without abnormal findings: Secondary | ICD-10-CM | POA: Diagnosis not present

## 2017-12-28 DIAGNOSIS — Z3042 Encounter for surveillance of injectable contraceptive: Secondary | ICD-10-CM

## 2017-12-28 DIAGNOSIS — Z113 Encounter for screening for infections with a predominantly sexual mode of transmission: Secondary | ICD-10-CM

## 2017-12-28 DIAGNOSIS — N898 Other specified noninflammatory disorders of vagina: Secondary | ICD-10-CM

## 2017-12-28 MED ORDER — MEDROXYPROGESTERONE ACETATE 150 MG/ML IM SUSP: 150.0000 mg | INTRAMUSCULAR | 3 refills | Status: DC

## 2017-12-28 NOTE — Progress Notes (Signed)
Subjective:        Karen Robles is a 37 y.o. female here for a routine exam.  Current complaints: None.    Personal health questionnaire:  Is patient Ashkenazi Jewish, have a family history of breast and/or ovarian cancer: no Is there a family history of uterine cancer diagnosed at age < 62, gastrointestinal cancer, urinary tract cancer, family member who is a Personnel officer syndrome-associated carrier: no Is the patient overweight and hypertensive, family history of diabetes, personal history of gestational diabetes, preeclampsia or PCOS: no Is patient over 9, have PCOS,  family history of premature CHD under age 98, diabetes, smoke, have hypertension or peripheral artery disease:  no At any time, has a partner hit, kicked or otherwise hurt or frightened you?: no Over the past 2 weeks, have you felt down, depressed or hopeless?: no Over the past 2 weeks, have you felt little interest or pleasure in doing things?:no   Gynecologic History No LMP recorded. Patient has had an injection. Contraception: Depo-Provera injections Last Pap: 2018. Results were: normal Last mammogram: n/a. Results were: n/a  Obstetric History OB History  Gravida Para Term Preterm AB Living  2 2 2     2   SAB TAB Ectopic Multiple Live Births          2    # Outcome Date GA Lbr Len/2nd Weight Sex Delivery Anes PTL Lv  2 Term 2012 [redacted]w[redacted]d   F    LIV  1 Term 2004   6 lb 4 oz (2.835 kg) F    LIV    Past Medical History:  Diagnosis Date  . Hypertension   . Obesity   . Pulmonary embolism [facility])     Past Surgical History:  Procedure Laterality Date  . MOUTH SURGERY    . WISDOM TOOTH EXTRACTION Bilateral      Current Outpatient Medications:  .  acetaminophen (TYLENOL) 500 MG tablet, Take 1,000 mg by mouth every 6 (six) hours as needed for mild pain., Disp: , Rfl:  .  hydrochlorothiazide (HYDRODIURIL) 25 MG tablet, Take 1 tablet (25 mg total) by mouth daily. MUST MAKE APPT FOR FURTHER REFILLS, Disp: 30  tablet, Rfl: 0 .  medroxyPROGESTERone (DEPO-PROVERA) 150 MG/ML injection, Inject 1 mL (150 mg total) into the muscle every 3 (three) months., Disp: 1 mL, Rfl: 3 .  Potassium Chloride ER 20 MEQ TBCR, Take 20 mEq by mouth daily., Disp: 30 tablet, Rfl: 4 .  rivaroxaban (XARELTO) 20 MG TABS tablet, TAKE ONE TABLET BY MOUTH ONCE DAILY WITH SUPPER, Disp: 90 tablet, Rfl: 1 No Known Allergies  Social History   Tobacco Use  . Smoking status: Never Smoker  . Smokeless tobacco: Never Used  Substance Use Topics  . Alcohol use: No    Alcohol/week: 0.0 standard drinks    Family History  Problem Relation Age of Onset  . Hypertension Mother        maternal aunts & uncles  . Hyperlipidemia Mother   . Healthy Brother   . Hypertension Maternal Aunt   . Hypertension Maternal Uncle   . Cancer Maternal Grandmother   . Diabetes Neg Hx   . Heart disease Neg Hx       Review of Systems  Constitutional: negative for fatigue and weight loss Respiratory: negative for cough and wheezing Cardiovascular: negative for chest pain, fatigue and palpitations Gastrointestinal: negative for abdominal pain and change in bowel habits Musculoskeletal:negative for myalgias Neurological: negative for gait problems and tremors Behavioral/Psych: negative  for abusive relationship, depression Endocrine: negative for temperature intolerance    Genitourinary:negative for abnormal menstrual periods, genital lesions, hot flashes, sexual problems and vaginal discharge Integument/breast: negative for breast lump, breast tenderness, nipple discharge and skin lesion(s)    Objective:       BP 130/90   Pulse 81   Ht 5\' 3"  (1.6 m)   Wt (!) 308 lb (139.7 kg)   BMI 54.56 kg/m  General:   alert  Skin:   no rash or abnormalities  Lungs:   clear to auscultation bilaterally  Heart:   regular rate and rhythm, S1, S2 normal, no murmur, click, rub or gallop  Breasts:   normal without suspicious masses, skin or nipple changes or  axillary nodes  Abdomen:  normal findings: no organomegaly, soft, non-tender and no hernia  Pelvis:  External genitalia: normal general appearance Urinary system: urethral meatus normal and bladder without fullness, nontender Vaginal: normal without tenderness, induration or masses Cervix: normal appearance Adnexa: normal bimanual exam Uterus: anteverted and non-tender, normal size   Lab Review Urine pregnancy test Labs reviewed yes Radiologic studies reviewed no  50% of 20 min visit spent on counseling and coordination of care.   Assessment:     1. Encounter for routine gynecological examination with Papanicolaou smear of cervix Rx: - Cytology - PAP  2. Vaginal discharge Rx: - Cervicovaginal ancillary only  3. Screening for STD (sexually transmitted disease) Rx: - HIV Antibody (routine testing w rflx) - Hepatitis B surface antigen - RPR - Hepatitis C antibody  4. Encounter for surveillance of injectable contraceptive Rx: - medroxyPROGESTERone (DEPO-PROVERA) 150 MG/ML injection; Inject 1 mL (150 mg total) into the muscle every 3 (three) months.  Dispense: 1 mL; Refill: 3     Plan:    Education reviewed: calcium supplements, depression evaluation, low fat, low cholesterol diet, safe sex/STD prevention, self breast exams and weight bearing exercise. Contraception: Depo-Provera injections. Follow up in: 1 year.   No orders of the defined types were placed in this encounter.  Orders Placed This Encounter  Procedures  . HIV Antibody (routine testing w rflx)  . Hepatitis B surface antigen  . RPR  . Hepatitis C antibody    Brock BadHARLES A. HARPER MD 12-28-2017

## 2017-12-28 NOTE — Progress Notes (Signed)
Patient Presents for her Annual Exam. Last pap: 12/11/2016 LMP: No cycles w/ Birth Control.  Contraception: Depo  STD: Full Panel  C/O:  NONE

## 2017-12-29 LAB — HIV ANTIBODY (ROUTINE TESTING W REFLEX): HIV SCREEN 4TH GENERATION: NONREACTIVE

## 2017-12-29 LAB — CERVICOVAGINAL ANCILLARY ONLY
BACTERIAL VAGINITIS: NEGATIVE
CANDIDA VAGINITIS: POSITIVE — AB
Chlamydia: NEGATIVE
Neisseria Gonorrhea: NEGATIVE
Trichomonas: NEGATIVE

## 2017-12-29 LAB — HEPATITIS C ANTIBODY: HEP C VIRUS AB: 0.1 {s_co_ratio} (ref 0.0–0.9)

## 2017-12-29 LAB — RPR: RPR Ser Ql: NONREACTIVE

## 2017-12-29 LAB — HEPATITIS B SURFACE ANTIGEN: HEP B S AG: NEGATIVE

## 2017-12-30 ENCOUNTER — Other Ambulatory Visit: Payer: Self-pay | Admitting: Obstetrics

## 2017-12-30 ENCOUNTER — Ambulatory Visit: Payer: Self-pay | Attending: Internal Medicine | Admitting: Physician Assistant

## 2017-12-30 VITALS — BP 117/76 | HR 98 | Temp 98.3°F | Resp 18 | Ht 63.0 in | Wt 313.0 lb

## 2017-12-30 DIAGNOSIS — B3731 Acute candidiasis of vulva and vagina: Secondary | ICD-10-CM

## 2017-12-30 DIAGNOSIS — Z86711 Personal history of pulmonary embolism: Secondary | ICD-10-CM | POA: Insufficient documentation

## 2017-12-30 DIAGNOSIS — I10 Essential (primary) hypertension: Secondary | ICD-10-CM | POA: Insufficient documentation

## 2017-12-30 DIAGNOSIS — Z7901 Long term (current) use of anticoagulants: Secondary | ICD-10-CM | POA: Insufficient documentation

## 2017-12-30 DIAGNOSIS — E669 Obesity, unspecified: Secondary | ICD-10-CM | POA: Insufficient documentation

## 2017-12-30 DIAGNOSIS — E876 Hypokalemia: Secondary | ICD-10-CM | POA: Insufficient documentation

## 2017-12-30 DIAGNOSIS — Z6841 Body Mass Index (BMI) 40.0 and over, adult: Secondary | ICD-10-CM | POA: Insufficient documentation

## 2017-12-30 DIAGNOSIS — B373 Candidiasis of vulva and vagina: Secondary | ICD-10-CM

## 2017-12-30 DIAGNOSIS — Z79899 Other long term (current) drug therapy: Secondary | ICD-10-CM | POA: Insufficient documentation

## 2017-12-30 MED ORDER — POTASSIUM CHLORIDE ER 20 MEQ PO TBCR: 20.0000 meq | EXTENDED_RELEASE_TABLET | Freq: Every day | ORAL | 1 refills | Status: DC

## 2017-12-30 MED ORDER — FLUCONAZOLE 150 MG PO TABS: 150.0000 mg | ORAL_TABLET | Freq: Once | ORAL | 0 refills | Status: AC

## 2017-12-30 MED ORDER — HYDROCHLOROTHIAZIDE 25 MG PO TABS: 25.0000 mg | ORAL_TABLET | Freq: Every day | ORAL | 1 refills | Status: DC

## 2017-12-30 NOTE — Progress Notes (Signed)
Patient ID: Karen Robles, female   DOB: 1980-04-20, 37 y.o.   MRN: [id number]    Karen Robles, is a 37 y.o. female  [id number]  [id number]  DOB - December 15, 1980  Subjective:  Chief Complaint and HPI: Karen Robles is a 37 y.o. female here today for BP med RF.  She has been out of meds X 3 days.  Still on K+ supplements.  No muscle cramping.  No HA/CP/SOB/Dizziness.  Wants to get flu shot today.  No recent or febrile illness.   ROS:   Constitutional:  No f/c, No night sweats, No unexplained weight loss. EENT:  No vision changes, No blurry vision, No hearing changes. No mouth, throat, or ear problems.  Respiratory: No cough, No SOB Cardiac: No CP, no palpitations GI:  No abd pain, No N/V/D. GU: No Urinary s/sx Musculoskeletal: No joint pain Neuro: No headache, no dizziness, no motor weakness.  Skin: No rash Endocrine:  No polydipsia. No polyuria.  Psych: Denies SI/HI  No problems updated.  ALLERGIES: No Known Allergies  PAST MEDICAL HISTORY: Past Medical History:  Diagnosis Date  . Hypertension   . Obesity   . Pulmonary embolism (HCC)     MEDICATIONS AT HOME: Prior to Admission medications   Medication Sig Start Date End Date Taking? Authorizing Provider  acetaminophen (TYLENOL) 500 MG tablet Take 1,000 mg by mouth every 6 (six) hours as needed for mild pain.   Yes [provider]  fluconazole (DIFLUCAN) 150 MG tablet Take 1 tablet (150 mg total) by mouth once for 1 dose. 12/30/17 12/30/17 Yes Brock Bad, MD  medroxyPROGESTERone (DEPO-PROVERA) 150 MG/ML injection Inject 1 mL (150 mg total) into the muscle every 3 (three) months. 12/28/17  Yes Brock Bad, MD  rivaroxaban (XARELTO) 20 MG TABS tablet TAKE ONE TABLET BY MOUTH ONCE DAILY WITH SUPPER 10/13/17  Yes Marcine Matar, MD  hydrochlorothiazide (HYDRODIURIL) 25 MG tablet Take 1 tablet (25 mg total) by mouth daily. 12/30/17   Anders Simmonds, PA-C  Potassium Chloride ER 20 MEQ TBCR Take  20 mEq by mouth daily. 12/30/17   Anders Simmonds, PA-C     Objective:  EXAM:   Vitals:   12/30/17 1100  BP: 117/76  Pulse: 98  Resp: 18  Temp: 98.3 F (36.8 C)  TempSrc: Oral  SpO2: 98%  Weight: (!) 313 lb (142 kg)  Height: 5\' 3"  (1.6 m)    General appearance : A&OX3. NAD. Non-toxic-appearing, morbidly obese HEENT: Atraumatic and Normocephalic.  PERRLA. EOM intact.  Neck: supple, no JVD. No cervical lymphadenopathy. No thyromegaly Chest/Lungs:  Breathing-non-labored, Good air entry bilaterally, breath sounds normal without rales, rhonchi, or wheezing  CVS: S1 S2 regular, no murmurs, gallops, rubs  Extremities: Bilateral Lower Ext shows no edema, both legs are warm to touch with = pulse throughout Neurology:  CN II-XII grossly intact, Non focal.   Psych:  TP linear. J/I WNL. Normal speech. Appropriate eye contact and affect.  Skin:  No Rash  Data Review Lab Results  Component Value Date   HGBA1C 5.3 04/14/2016   HGBA1C 5.7 (H) 02/08/2013     Assessment & Plan   1. Essential hypertension Controlled.  We have discussed target BP range and blood pressure goal. I have advised patient to check BP regularly and to call us back or report to clinic if the numbers are consistently higher than 140/90. We discussed the importance of compliance with medical therapy and DASH diet recommended, consequences of uncontrolled hypertension  discussed.  - hydrochlorothiazide (HYDRODIURIL) 25 MG tablet; Take 1 tablet (25 mg total) by mouth daily.  Dispense: 90 tablet; Refill: 1 - Potassium Chloride ER 20 MEQ TBCR; Take 20 mEq by mouth daily.  Dispense: 90 tablet; Refill: 1  2. Hypokalemia - Potassium Chloride ER 20 MEQ TBCR; Take 20 mEq by mouth daily.  Dispense: 90 tablet; Refill: 1 - Basic metabolic panel  3.  Flu shot today.       Patient have been counseled extensively about nutrition and exercise  Return in about 5 months (around 06/01/2018) for Dr Laural Benes; htn  management.  The patient was given clear instructions to go to ER or return to medical center if symptoms don't improve, worsen or new problems develop. The patient verbalized understanding. The patient was told to call to get lab results if they haven't heard anything in the next week.     Georgian Co, PA-C [facility] [place], [place] [phone]   12/30/2017, 11:05 AM

## 2017-12-31 LAB — BASIC METABOLIC PANEL
BUN/Creatinine Ratio: 13 (ref 9–23)
BUN: 12 mg/dL (ref 6–20)
CHLORIDE: 104 mmol/L (ref 96–106)
CO2: 22 mmol/L (ref 20–29)
Calcium: 8.6 mg/dL — ABNORMAL LOW (ref 8.7–10.2)
Creatinine, Ser: 0.9 mg/dL (ref 0.57–1.00)
GFR calc Af Amer: 96 mL/min/{1.73_m2} (ref >59)
GFR, EST NON AFRICAN AMERICAN: 83 mL/min/{1.73_m2} (ref >59)
Glucose: 52 mg/dL — ABNORMAL LOW (ref 65–99)
POTASSIUM: 3.4 mmol/L — AB (ref 3.5–5.2)
Sodium: 143 mmol/L (ref 134–144)

## 2017-12-31 LAB — CYTOLOGY - PAP
DIAGNOSIS: NEGATIVE
HPV: NOT DETECTED

## 2018-01-03 NOTE — Telephone Encounter (Signed)
Patient verified DOB Patient is aware of sugar and kidney function being okay and needing to increase potassium reach foods. Patient will follow up as planned.

## 2018-01-03 NOTE — Telephone Encounter (Signed)
-----   Message from Anders Simmonds, [place] sent at 01/03/2018  2:46 PM EDT ----- Your blood sugar and kidney function are ok.  Your potassium is slightly low.  Eat potassium rich foods-spinach, banana(you can look up others online.)  Low potassium can cause problems such as muscle cramping.  Follow-up as planned.  Thanks, Georgian Co, PA-C

## 2018-01-11 ENCOUNTER — Ambulatory Visit (INDEPENDENT_AMBULATORY_CARE_PROVIDER_SITE_OTHER): Payer: Medicaid Other

## 2018-01-11 VITALS — BP 129/87 | HR 109 | Wt 303.4 lb

## 2018-01-11 DIAGNOSIS — Z3042 Encounter for surveillance of injectable contraceptive: Secondary | ICD-10-CM | POA: Diagnosis not present

## 2018-01-11 MED ORDER — MEDROXYPROGESTERONE ACETATE 150 MG/ML IM SUSP
150.0000 mg | Freq: Once | INTRAMUSCULAR | Status: AC
  Administered 2018-01-11: 150 mg via INTRAMUSCULAR

## 2018-01-11 NOTE — Progress Notes (Signed)
presents for DEPO, given in RD, tolerated well.  Next DEPO 12/24-01/10/2018.  Administrations This Visit    medroxyPROGESTERone (DEPO-PROVERA) injection 150 mg    Admin Date 01/11/2018 Action Given Dose 150 mg Route Intramuscular Administered By Maretta Bees, RMA

## 2018-01-13 MED FILL — XARELTO 20 MG TABLET: 20 | 30 days supply | Qty: 30 | Fill #3

## 2018-02-14 MED FILL — XARELTO 20 MG TABLET: 20 | 30 days supply | Qty: 30 | Fill #4

## 2018-03-21 MED FILL — XARELTO 20 MG TABLET: 20 | 30 days supply | Qty: 30 | Fill #5

## 2018-04-04 ENCOUNTER — Ambulatory Visit (INDEPENDENT_AMBULATORY_CARE_PROVIDER_SITE_OTHER): Payer: Medicaid Other

## 2018-04-04 DIAGNOSIS — Z3042 Encounter for surveillance of injectable contraceptive: Secondary | ICD-10-CM | POA: Diagnosis not present

## 2018-04-04 MED ORDER — MEDROXYPROGESTERONE ACETATE 150 MG/ML IM SUSP
150.0000 mg | INTRAMUSCULAR | Status: DC
  Administered 2018-04-04 – 2018-06-22 (×2): 150 mg via INTRAMUSCULAR

## 2018-04-04 NOTE — Progress Notes (Signed)
Patient presents for Depo injection.   Depo given in Rt Deltoid  Next Depo Due 06/21/18-0331/20 Pt supplied  Exp: 11/2019

## 2018-04-15 ENCOUNTER — Other Ambulatory Visit: Payer: Self-pay | Admitting: Internal Medicine

## 2018-04-15 DIAGNOSIS — Z86711 Personal history of pulmonary embolism: Secondary | ICD-10-CM

## 2018-04-18 MED FILL — XARELTO 20 MG TABLET: 20 | 30 days supply | Qty: 30 | Fill #0

## 2018-05-20 MED FILL — XARELTO 20 MG TABLET: 20 | 30 days supply | Qty: 30 | Fill #1

## 2018-06-20 ENCOUNTER — Other Ambulatory Visit: Payer: Self-pay | Admitting: Internal Medicine

## 2018-06-20 DIAGNOSIS — Z86711 Personal history of pulmonary embolism: Secondary | ICD-10-CM

## 2018-06-21 ENCOUNTER — Telehealth: Payer: Self-pay | Admitting: Internal Medicine

## 2018-06-21 NOTE — Telephone Encounter (Signed)
1) Medication(s) Requested (by name): XARELTO 20 MG TABS tablet   2) Pharmacy of Choice: [facility] 3) Special Requests: Pt did schedule future appt would like enough suppply until then  Approved medications will be sent to the pharmacy, we will reach out if there is an issue.  Requests made after 3pm may not be addressed until the following business day!  If a patient is unsure of the name of the medication(s) please note and ask patient to call back when they are able to provide all info, do not send to responsible party until all information is available!

## 2018-06-22 ENCOUNTER — Ambulatory Visit (INDEPENDENT_AMBULATORY_CARE_PROVIDER_SITE_OTHER): Payer: Medicaid Other

## 2018-06-22 ENCOUNTER — Other Ambulatory Visit: Payer: Self-pay | Admitting: Internal Medicine

## 2018-06-22 ENCOUNTER — Other Ambulatory Visit: Payer: Self-pay

## 2018-06-22 DIAGNOSIS — Z3042 Encounter for surveillance of injectable contraceptive: Secondary | ICD-10-CM | POA: Diagnosis not present

## 2018-06-22 DIAGNOSIS — Z86711 Personal history of pulmonary embolism: Secondary | ICD-10-CM

## 2018-06-22 MED FILL — XARELTO 20 MG TABLET: 20 | 30 days supply | Qty: 30 | Fill #0

## 2018-06-22 NOTE — Progress Notes (Signed)
Nurse visit for pt supplied Depo. Pt is on time for inj. Depo given RD without complaints. Next Depo due 6/3-6/17.

## 2018-07-01 ENCOUNTER — Other Ambulatory Visit: Payer: Self-pay | Admitting: Internal Medicine

## 2018-07-01 DIAGNOSIS — I10 Essential (primary) hypertension: Secondary | ICD-10-CM

## 2018-07-04 NOTE — Progress Notes (Signed)
Virtual Visit via Telephone Note  I connected with Karen Robles on 07/04/18 at  9:00 AM EDT by telephone and verified that I am speaking with the correct person using two identifiers.   I discussed the limitations, risks, security and privacy concerns of performing an evaluation and management service by telephone and the availability of in person appointments. I also discussed with the patient that there may be a patient responsible charge related to this service. The patient expressed understanding and agreed to proceed.   History of Present Illness: This patient has not been seen in our clinic since September 2019.  This is a 38 year old female with longstanding hypertension treated with hydrochlorothiazide 25 mg daily.  The patient also takes a supplement of potassium.  This is a telephone visit accomplished today and I verified the patient's identification and there were no one else on the phone call for time.  The patient states that she works at [employer] and has been very worried about getting ill with the coronavirus.  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  She is taking all the necessary precautions.  She denies any fever, shortness of breath chest pain or tachycardia.  She has no swelling in the lower extremities.  She has no cough. 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  She did have an elevated lupus anticoagulant but all her other coagulant test were negative.  The patient states she is not been measuring her blood pressure and does state that her weight is still remaining over 300 pounds. The patient has no other complaints at this time.  She is in need of refills on her medications.   Observations/Objective: This is a telephone visit so no observations were able to be made at this visit  Assessment and Plan: #1 hypertension appears to be well controlled note previous values in the epic system  show good control of blood pressure.  The plan will be to refill hydrochlorothiazide at 25 mg daily and potassium 20 mg daily.  These medications were sent to the patient's [facility] pharmacy  #2 history of unprovoked pulmonary embolism with lupus anticoagulant positive, in this case I would like to retest the patient's hypercoagulable state the next time she could come into the office I told the patient this can wait for several months until the coronavirus outbreak subsides  The plan here will be to refill the Xarelto at 20 mg daily and this was sent to our pharmacy and will be mailed to the patient's home as she gets patient assistance on Xarelto  Follow Up Instructions: The patient understands where the prescriptions have been mailed and she is to maintain her medicines at current prescribed dose  I would like to obtain a hypercoagulable study upon her return visit and she likely needs to stay on Xarelto for life    I discussed the assessment and treatment plan with the patient. The patient was provided an opportunity to ask questions and all were answered. The patient agreed with the plan and demonstrated an understanding of the instructions.  The patient is return to the office for an office visit in 2 months   The patient was advised to call back or seek an in-person evaluation if the symptoms worsen or if the condition fails to improve as anticipated.  I provided 25 minutes of non-face-to-face time during this encounter.   Shan Levans, MD

## 2018-07-05 ENCOUNTER — Ambulatory Visit: Payer: Self-pay | Attending: Critical Care Medicine | Admitting: Critical Care Medicine

## 2018-07-05 ENCOUNTER — Other Ambulatory Visit: Payer: Self-pay

## 2018-07-05 ENCOUNTER — Encounter: Payer: Self-pay | Admitting: Critical Care Medicine

## 2018-07-05 DIAGNOSIS — I10 Essential (primary) hypertension: Secondary | ICD-10-CM

## 2018-07-05 DIAGNOSIS — Z86711 Personal history of pulmonary embolism: Secondary | ICD-10-CM

## 2018-07-05 DIAGNOSIS — E876 Hypokalemia: Secondary | ICD-10-CM

## 2018-07-05 MED ORDER — HYDROCHLOROTHIAZIDE 25 MG PO TABS: ORAL_TABLET | ORAL | 1 refills | Status: DC

## 2018-07-05 MED ORDER — POTASSIUM CHLORIDE ER 20 MEQ PO TBCR: 20.0000 meq | EXTENDED_RELEASE_TABLET | Freq: Every day | ORAL | 1 refills | Status: DC

## 2018-07-05 MED ORDER — RIVAROXABAN 20 MG PO TABS: ORAL_TABLET | ORAL | 2 refills | Status: DC

## 2018-07-05 NOTE — Progress Notes (Signed)
Patient verified DOB Patient has not taken medication today. Patient has not eaten today. Patient denies any pain at this time. Patient works at [employer] and feels SOB while working- attributes to worry being exposed. At home patient feels safe. Patient is requesting refills on potassium, HCTZ and xarelto.

## 2018-07-19 MED FILL — XARELTO 20 MG TABLET: 20 | 30 days supply | Qty: 30 | Fill #1

## 2018-08-18 MED FILL — XARELTO 20 MG TABLET: 20 | 30 days supply | Qty: 30 | Fill #0

## 2018-09-12 ENCOUNTER — Ambulatory Visit: Payer: Medicaid Other

## 2018-09-16 ENCOUNTER — Ambulatory Visit (INDEPENDENT_AMBULATORY_CARE_PROVIDER_SITE_OTHER): Payer: Medicaid Other

## 2018-09-16 ENCOUNTER — Other Ambulatory Visit: Payer: Self-pay

## 2018-09-16 DIAGNOSIS — Z3042 Encounter for surveillance of injectable contraceptive: Secondary | ICD-10-CM

## 2018-09-16 MED ORDER — MEDROXYPROGESTERONE ACETATE 150 MG/ML IM SUSP: 150.0000 mg | INTRAMUSCULAR | 0 refills | Status: DC

## 2018-09-16 MED ORDER — MEDROXYPROGESTERONE ACETATE 150 MG/ML IM SUSP
150.0000 mg | Freq: Once | INTRAMUSCULAR | Status: AC
  Administered 2018-09-16: 150 mg via INTRAMUSCULAR

## 2018-09-16 NOTE — Progress Notes (Signed)
Pt presents for depo injections. Last inj given 06/22/18. Pt is within her window.Inj given in left deltoid. Next depo due 7/28-9/11.

## 2018-09-20 MED FILL — XARELTO 20 MG TABLET: 20 | 30 days supply | Qty: 30 | Fill #1

## 2018-09-25 NOTE — Progress Notes (Signed)
Patient ID: Karen Robles, female   DOB: 1980-10-10, 38 y.o.   MRN: [id number] I have reviewed the chart and agree with nursing staff's documentation of this patient's encounter.  Emeterio Reeve, MD 09/25/2018 2:40 PM

## 2018-09-27 ENCOUNTER — Encounter (HOSPITAL_COMMUNITY): Payer: Self-pay

## 2018-09-27 ENCOUNTER — Other Ambulatory Visit: Payer: Self-pay

## 2018-09-27 ENCOUNTER — Ambulatory Visit (HOSPITAL_COMMUNITY)
Admission: EM | Admit: 2018-09-27 | Discharge: 2018-09-27 | Disposition: A | Discharge status: Home / Self Care | Payer: Medicaid Other | Attending: Family Medicine | Admitting: Family Medicine

## 2018-09-27 DIAGNOSIS — K219 Gastro-esophageal reflux disease without esophagitis: Secondary | ICD-10-CM

## 2018-09-27 DIAGNOSIS — R0789 Other chest pain: Secondary | ICD-10-CM

## 2018-09-27 MED ORDER — ALUMINUM-MAGNESIUM-SIMETHICONE 200-200-20 MG/5ML PO SUSP: 30.0000 mL | Freq: Three times a day (TID) | ORAL | 0 refills | Status: DC

## 2018-09-27 MED ORDER — LIDOCAINE VISCOUS HCL 2 % MT SOLN
15.0000 mL | Freq: Once | OROMUCOSAL | Status: AC
  Administered 2018-09-27: 15 mL via ORAL

## 2018-09-27 MED ORDER — ALUM & MAG HYDROXIDE-SIMETH 200-200-20 MG/5ML PO SUSP
ORAL | Status: AC
  Filled 2018-09-27: qty 30

## 2018-09-27 MED ORDER — ALUM & MAG HYDROXIDE-SIMETH 200-200-20 MG/5ML PO SUSP
30.0000 mL | Freq: Once | ORAL | Status: AC
  Administered 2018-09-27: 30 mL via ORAL

## 2018-09-27 MED ORDER — LIDOCAINE VISCOUS HCL 2 % MT SOLN
OROMUCOSAL | Status: AC
  Filled 2018-09-27: qty 15

## 2018-09-27 MED ORDER — OMEPRAZOLE 20 MG PO CPDR: 20.0000 mg | DELAYED_RELEASE_CAPSULE | Freq: Every day | ORAL | 0 refills | Status: DC

## 2018-09-27 NOTE — ED Triage Notes (Signed)
Patient presents to Urgent Care with complaints of centralized heartburn since 4 days ago, worse last night as she was swallowing food. Patient reports she does not have a hx of acid reflux.

## 2018-09-27 NOTE — ED Provider Notes (Signed)
[facility]    CSN: [id number] Arrival date & time: 09/27/18  1217      History   Chief Complaint Chief Complaint  Patient presents with   Heartburn    HPI Karen Robles is a 38 y.o. female history of hypertension, obesity, previous PE, presenting today for evaluation of centralized chest discomfort.  Patient states that over the past 4 days she has had a burning sensation in her central chest.  Notices it worse with eating, but also notices that at rest.  Denies worsening with changing position.  Has had some slight increase in discomfort with exertion.  Denies shortness of breath or cough.  Denies radiation of pain.  Denies nausea or vomiting.  Denies abdominal pain.  Denies fevers chills or body aches.  Denies leg swelling.  HPI  Past Medical History:  Diagnosis Date   Hypertension    Obesity    Pulmonary embolism [facility](HCC)     Patient Active Problem List   Diagnosis Date Noted   History of pulmonary embolism 07/22/2017   Surveillance of contraceptive injection 06/11/2016   Severe obesity (BMI >= 40) (HCC) 07/26/2013   Essential hypertension 03/07/2013   Chlamydia infection 02/11/2013    Past Surgical History:  Procedure Laterality Date   MOUTH SURGERY     WISDOM TOOTH EXTRACTION Bilateral     OB History    Gravida  2   Para  2   Term  2   Preterm      AB      Living  2     SAB      TAB      Ectopic      Multiple      Live Births  2            Home Medications    Prior to Admission medications   Medication Sig Start Date End Date Taking? Authorizing Provider  acetaminophen (TYLENOL) 500 MG tablet Take 1,000 mg by mouth every 6 (six) hours as needed for mild pain.    [provider]  aluminum-magnesium hydroxide-simethicone (MAALOX) 200-200-20 MG/5ML SUSP Take 30 mLs by mouth 4 (four) times daily -  before meals and at bedtime. 09/27/18   Jahid Weida C, PA-C  hydrochlorothiazide (HYDRODIURIL) 25 MG  tablet TAKE 1 TABLET BY MOUTH ONCE DAILY(MUST HAVE OFFICE VISIT FOR REFILLS) 07/05/18   Storm FriskWright, Patrick E, MD  medroxyPROGESTERone (DEPO-PROVERA) 150 MG/ML injection Inject 1 mL (150 mg total) into the muscle every 3 (three) months. 12/28/17   Brock BadHarper, Charles A, MD  omeprazole (PRILOSEC) 20 MG capsule Take 1 capsule (20 mg total) by mouth daily. 09/27/18   Demere Dotzler C, PA-C  Potassium Chloride ER 20 MEQ TBCR Take 20 mEq by mouth daily. 07/05/18   Storm FriskWright, Patrick E, MD  rivaroxaban (XARELTO) 20 MG TABS tablet TAKE ONE TABLET BY MOUTH ONCE DAILY WITH SUPPER 07/05/18   Storm FriskWright, Patrick E, MD    Family History Family History  Problem Relation Age of Onset   Hypertension Mother        maternal aunts & uncles   Hyperlipidemia Mother    Healthy Brother    Hypertension Maternal Aunt    Hypertension Maternal Uncle    Cancer Maternal Grandmother    Diabetes Neg Hx    Heart disease Neg Hx     Social History Social History   Tobacco Use   Smoking status: Never Smoker   Smokeless tobacco: Never Used  Substance Use Topics  Alcohol use: No    Alcohol/week: 0.0 standard drinks   Drug use: No     Allergies   Patient has no known allergies.   Review of Systems Review of Systems  Constitutional: Negative for fatigue and fever.  HENT: Negative for congestion, sinus pressure and sore throat.   Eyes: Negative for photophobia, pain and visual disturbance.  Respiratory: Negative for cough and shortness of breath.   Cardiovascular: Positive for chest pain.  Gastrointestinal: Negative for abdominal pain, nausea and vomiting.  Genitourinary: Negative for decreased urine volume and hematuria.  Musculoskeletal: Negative for myalgias, neck pain and neck stiffness.  Neurological: Negative for dizziness, syncope, facial asymmetry, speech difficulty, weakness, light-headedness, numbness and headaches.     Physical Exam Triage Vital Signs ED Triage Vitals  Enc Vitals Group     BP  09/27/18 1238 117/83     Pulse Rate 09/27/18 1238 97     Resp 09/27/18 1238 18     Temp 09/27/18 1238 98.7 F (37.1 C)     Temp Source 09/27/18 1238 Oral     SpO2 09/27/18 1238 100 %     Weight --      Height --      Head Circumference --      Peak Flow --      Pain Score 09/27/18 1235 3     Pain Loc --      Pain Edu? --      Excl. in [place]? --    No data found.  Updated Vital Signs BP 117/83 (BP Location: Right Arm)    Pulse 97    Temp 98.7 F (37.1 C) (Oral)    Resp 18    SpO2 100%   Visual Acuity Right Eye Distance:   Left Eye Distance:   Bilateral Distance:    Right Eye Near:   Left Eye Near:    Bilateral Near:     Physical Exam Vitals signs and nursing note reviewed.  Constitutional:      Appearance: She is well-developed.     Comments: No acute distress  HENT:     Head: Normocephalic and atraumatic.     Nose: Nose normal.     Mouth/Throat:     Comments: Oral mucosa pink and moist, no tonsillar enlargement or exudate. Posterior pharynx patent and nonerythematous, no uvula deviation or swelling. Normal phonation.  Eyes:     Conjunctiva/sclera: Conjunctivae normal.  Neck:     Musculoskeletal: Neck supple.  Cardiovascular:     Rate and Rhythm: Normal rate.  Pulmonary:     Effort: Pulmonary effort is normal. No respiratory distress.     Comments: Breathing comfortably at rest, CTABL, no wheezing, rales or other adventitious sounds auscultated  Chest discomfort not reproducible to palpation of anterior chest Abdominal:     General: There is no distension.     Comments: Soft, nondistended, nontender light D palpation throughout entire abdomen  Musculoskeletal: Normal range of motion.  Skin:    General: Skin is warm and dry.  Neurological:     Mental Status: She is alert and oriented to person, place, and time.      UC Treatments / Results  Labs (all labs ordered are listed, but only abnormal results are displayed) Labs Reviewed - No data to  display  EKG None  Radiology No results found.  Procedures Procedures (including critical care time)  Medications Ordered in UC Medications  alum & mag hydroxide-simeth (MAALOX/MYLANTA) 102-725-36 MG/5ML suspension 30 mL (30  mLs Oral Given 09/27/18 1327)    And  lidocaine (XYLOCAINE) 2 % viscous mouth solution 15 mL (15 mLs Oral Given 09/27/18 1327)  alum & mag hydroxide-simeth (MAALOX/MYLANTA) 200-200-20 MG/5ML suspension (has no administration in time range)  lidocaine (XYLOCAINE) 2 % viscous mouth solution (has no administration in time range)    Initial Impression / Assessment and Plan / UC Course  I have reviewed the triage vital signs and the nursing notes.  Pertinent labs & imaging results that were available during my care of the patient were reviewed by me and considered in my medical decision making (see chart for details).    EKG normal sinus rhythm, no acute signs of ischemia or infarction.  GI cocktail provided with relief of symptoms and chest.  Most likely GERD as cause of symptoms.  Will initiate on omeprazole daily x2 weeks.  Maalox to supplement as needed.  Discussed food choices to avoid.  Continue to monitor,Discussed strict return precautions. Patient verbalized understanding and is agreeable with plan.  Final Clinical Impressions(s) / UC Diagnoses   Final diagnoses:  Gastroesophageal reflux disease, esophagitis presence not specified   Discharge Instructions   None    ED Prescriptions    Medication Sig Dispense Auth. Provider   omeprazole (PRILOSEC) 20 MG capsule Take 1 capsule (20 mg total) by mouth daily. 30 capsule Sofi Bryars C, PA-C   aluminum-magnesium hydroxide-simethicone (MAALOX) 200-200-20 MG/5ML SUSP Take 30 mLs by mouth 4 (four) times daily -  before meals and at bedtime. 710 mL Preesha Benjamin C, PA-C     Controlled Substance Prescriptions [place] Controlled Substance Registry consulted? Not Applicable   Lew DawesWieters, Dena Esperanza C, [place]-C 09/27/18  1357

## 2018-10-24 MED FILL — XARELTO 20 MG TABLET: 20 | 30 days supply | Qty: 30 | Fill #2

## 2018-11-21 MED FILL — XARELTO 20 MG TABLET: 20 | 30 days supply | Qty: 30 | Fill #3

## 2018-12-07 ENCOUNTER — Ambulatory Visit: Payer: Medicaid Other

## 2018-12-09 ENCOUNTER — Other Ambulatory Visit: Payer: Self-pay

## 2018-12-09 ENCOUNTER — Ambulatory Visit (INDEPENDENT_AMBULATORY_CARE_PROVIDER_SITE_OTHER): Payer: Medicaid Other | Admitting: *Deleted

## 2018-12-09 VITALS — BP 130/93 | HR 97 | Temp 98.4°F | Ht 63.0 in | Wt 305.0 lb

## 2018-12-09 DIAGNOSIS — Z3042 Encounter for surveillance of injectable contraceptive: Secondary | ICD-10-CM | POA: Diagnosis not present

## 2018-12-09 MED ORDER — MEDROXYPROGESTERONE ACETATE 150 MG/ML IM SUSP
150.0000 mg | Freq: Once | INTRAMUSCULAR | Status: AC
  Administered 2018-12-09: 09:00:00 150 mg via INTRAMUSCULAR

## 2018-12-09 NOTE — Progress Notes (Signed)
   Subjective:  Pt in for Depo Provera injection.    Objective: Need for contraception. No unusual complaints.  Patient's blood pressure was elevated this AM. Pt reported she did take medication this morning but forgot to take it yesterday.   Assessment: Pt tolerated Depo injection. Depo given Right Deltoid. Pt denies any symptoms: chest pain, headache, dizziness, visual changes or SOB.  Plan: Patient also need annual exam.  Next injection due February 24, 2019-March 10, 2019. Pt advised to follow up with PCP for elevated blood pressure.    Derl Barrow, RN

## 2018-12-20 MED FILL — XARELTO 20 MG TABLET: 20 | 30 days supply | Qty: 30 | Fill #4

## 2019-01-17 MED FILL — XARELTO 20 MG TABLET: 20 | 30 days supply | Qty: 30 | Fill #5

## 2019-01-18 ENCOUNTER — Ambulatory Visit (INDEPENDENT_AMBULATORY_CARE_PROVIDER_SITE_OTHER): Payer: Medicaid Other | Admitting: Family Medicine

## 2019-01-18 ENCOUNTER — Other Ambulatory Visit (HOSPITAL_COMMUNITY)
Admission: RE | Admit: 2019-01-18 | Discharge: 2019-01-18 | Disposition: A | Discharge status: Home / Self Care | Payer: Medicaid Other | Source: Ambulatory Visit | Attending: Family Medicine | Admitting: Family Medicine

## 2019-01-18 ENCOUNTER — Encounter: Payer: Self-pay | Admitting: Family Medicine

## 2019-01-18 ENCOUNTER — Other Ambulatory Visit: Payer: Self-pay

## 2019-01-18 VITALS — BP 121/83 | HR 98 | Temp 98.3°F | Ht 64.0 in | Wt 306.7 lb

## 2019-01-18 DIAGNOSIS — Z3042 Encounter for surveillance of injectable contraceptive: Secondary | ICD-10-CM

## 2019-01-18 DIAGNOSIS — Z0000 Encounter for general adult medical examination without abnormal findings: Secondary | ICD-10-CM

## 2019-01-18 DIAGNOSIS — Z01419 Encounter for gynecological examination (general) (routine) without abnormal findings: Secondary | ICD-10-CM

## 2019-01-18 DIAGNOSIS — Z23 Encounter for immunization: Secondary | ICD-10-CM

## 2019-01-18 MED ORDER — MEDROXYPROGESTERONE ACETATE 150 MG/ML IM SUSP: 150.0000 mg | INTRAMUSCULAR | 3 refills | Status: DC

## 2019-01-18 NOTE — Patient Instructions (Addendum)
Human Papillomavirus Quadrivalent Vaccine suspension for injection What is this medicine? HUMAN PAPILLOMAVIRUS VACCINE (HYOO muhn pap uh LOH muh vahy ruhs vak SEEN) is a vaccine. It is used to prevent infections of four types of the human papillomavirus. In women, the vaccine may lower your risk of getting cervical, vaginal, vulvar, or anal cancer and genital warts. In men, the vaccine may lower your risk of getting genital warts and anal cancer. You cannot get these diseases from the vaccine. This vaccine does not treat these diseases. This medicine may be used for other purposes; ask your health care provider or pharmacist if you have questions. COMMON BRAND NAME(S): Gardasil What should I tell my health care provider before I take this medicine? They need to know if you have any of these conditions:  fever or infection  hemophilia  HIV infection or AIDS  immune system problems  low platelet count  an unusual reaction to Human Papillomavirus Vaccine, yeast, other medicines, foods, dyes, or preservatives  pregnant or trying to get pregnant  breast-feeding How should I use this medicine? This vaccine is for injection in a muscle on your upper arm or thigh. It is given by a health care professional. You will be observed for 15 minutes after each dose. Sometimes, fainting happens after the vaccine is given. You may be asked to sit or lie down during the 15 minutes. Three doses are given. The second dose is given 2 months after the first dose. The last dose is given 4 months after the second dose. A copy of a Vaccine Information Statement will be given before each vaccination. Read this sheet carefully each time. The sheet may change frequently. Talk to your pediatrician regarding the use of this medicine in children. While this drug may be prescribed for children as young as 9 years of age for selected conditions, precautions do apply. Overdosage: If you think you have taken too much of this  medicine contact a poison control center or emergency room at once. NOTE: This medicine is only for you. Do not share this medicine with others. What if I miss a dose? All 3 doses of the vaccine should be given within 6 months. Remember to keep appointments for follow-up doses. Your health care provider will tell you when to return for the next vaccine. Ask your health care professional for advice if you are unable to keep an appointment or miss a scheduled dose. What may interact with this medicine?  other vaccines This list may not describe all possible interactions. Give your health care provider a list of all the medicines, herbs, non-prescription drugs, or dietary supplements you use. Also tell them if you smoke, drink alcohol, or use illegal drugs. Some items may interact with your medicine. What should I watch for while using this medicine? This vaccine may not fully protect everyone. Continue to have regular pelvic exams and cervical or anal cancer screenings as directed by your doctor. The Human Papillomavirus is a sexually transmitted disease. It can be passed by any kind of sexual activity that involves genital contact. The vaccine works best when given before you have any contact with the virus. Many people who have the virus do not have any signs or symptoms. Tell your doctor or health care professional if you have any reaction or unusual symptom after getting the vaccine. What side effects may I notice from receiving this medicine? Side effects that you should report to your doctor or health care professional as soon as possible:    allergic reactions like skin rash, itching or hives, swelling of the face, lips, or tongue  breathing problems  feeling faint or lightheaded, falls Side effects that usually do not require medical attention (report to your doctor or health care professional if they continue or are  bothersome):  cough  dizziness  fever  headache  nausea  redness, warmth, swelling, pain, or itching at site where injected This list may not describe all possible side effects. Call your doctor for medical advice about side effects. You may report side effects to FDA at 1-800-FDA-1088. Where should I keep my medicine? This drug is given in a hospital or clinic and will not be stored at home. NOTE: This sheet is a summary. It may not cover all possible information. If you have questions about this medicine, talk to your doctor, pharmacist, or health care provider.  2020 Elsevier/Gold Standard (2013-05-15 13:14:33)

## 2019-01-18 NOTE — Progress Notes (Signed)
GYNECOLOGY ANNUAL PREVENTATIVE CARE ENCOUNTER NOTE  Subjective:   Karen Robles is a 38 y.o. [id number] female here for a annual gynecologic exam. Current complaints: none.   Denies abnormal vaginal bleeding, discharge, pelvic pain, problems with intercourse or other gynecologic concerns.    Gynecologic History No LMP recorded (lmp unknown). Patient has had an injection. Contraception: Depo-Provera injections (last one September) Last Pap: 12/2017. Results were: Negative for malignancy, negative HPV- next one due 12/2020 Last mammogram: n/a Gardisil: has not received  Obstetric History OB History  Gravida Para Term Preterm AB Living  2 2 2     2   SAB TAB Ectopic Multiple Live Births          2    # Outcome Date GA Lbr Len/2nd Weight Sex Delivery Anes PTL Lv  2 Term 2012 [id number]   F    LIV  1 Term 2004   6 lb 4 oz (2.835 kg) F    LIV    Past Medical History:  Diagnosis Date  . Hypertension   . Obesity   . Pulmonary embolism [facility](HCC)     Past Surgical History:  Procedure Laterality Date  . MOUTH SURGERY    . WISDOM TOOTH EXTRACTION Bilateral     Current Outpatient Medications on File Prior to Visit  Medication Sig Dispense Refill  . acetaminophen (TYLENOL) 500 MG tablet Take 1,000 mg by mouth every 6 (six) hours as needed for mild pain.    Marland Kitchen. aluminum-magnesium hydroxide-simethicone (MAALOX) 200-200-20 MG/5ML SUSP Take 30 mLs by mouth 4 (four) times daily -  before meals and at bedtime. (Patient not taking: Reported on 01/18/2019) 710 mL 0  . hydrochlorothiazide (HYDRODIURIL) 25 MG tablet TAKE 1 TABLET BY MOUTH ONCE DAILY(MUST HAVE OFFICE VISIT FOR REFILLS) 90 tablet 1  . omeprazole (PRILOSEC) 20 MG capsule Take 1 capsule (20 mg total) by mouth daily. (Patient not taking: Reported on 01/18/2019) 30 capsule 0  . Potassium Chloride ER 20 MEQ TBCR Take 20 mEq by mouth daily. 90 tablet 1  . rivaroxaban (XARELTO) 20 MG TABS tablet TAKE ONE TABLET BY MOUTH ONCE DAILY WITH SUPPER 60  tablet 2   Current Facility-Administered Medications on File Prior to Visit  Medication Dose Route Frequency Provider Last Rate Last Dose  . medroxyPROGESTERone (DEPO-PROVERA) injection 150 mg  150 mg Intramuscular Q90 days Brock BadHarper, Charles A, MD   150 mg at 06/22/18 1123    No Known Allergies  Social History   Socioeconomic History  . Marital status: Single    Spouse name: Not on file  . Number of children: 2  . Years of education: Not on file  . Highest education level: Not on file  Occupational History  . Occupation: Unemployed  Social Needs  . Financial resource strain: Not on file  . Food insecurity    Worry: Not on file    Inability: Not on file  . Transportation needs    Medical: Not on file    Non-medical: Not on file  Tobacco Use  . Smoking status: Never Smoker  . Smokeless tobacco: Never Used  Substance and Sexual Activity  . Alcohol use: No    Alcohol/week: 0.0 standard drinks  . Drug use: No  . Sexual activity: Yes    Partners: Male    Birth control/protection: Injection  Lifestyle  . Physical activity    Days per week: Not on file    Minutes per session: Not on file  . Stress: Not  on file  Relationships  . Social Musician on phone: Not on file    Gets together: Not on file    Attends religious service: Not on file    Active member of club or organization: Not on file    Attends meetings of clubs or organizations: Not on file    Relationship status: Not on file  . Intimate partner violence    Fear of current or ex partner: Not on file    Emotionally abused: Not on file    Physically abused: Not on file    Forced sexual activity: Not on file  Other Topics Concern  . Not on file  Social History Narrative  . Not on file    Family History  Problem Relation Age of Onset  . Hypertension Mother        maternal aunts & uncles  . Hyperlipidemia Mother   . Healthy Brother   . Hypertension Maternal Aunt   . Hypertension Maternal Uncle    . Cancer Maternal Grandmother   . Diabetes Neg Hx   . Heart disease Neg Hx     Diet: varied Exercise: endorses some exercise, but is not very active  The following portions of the patient's history were reviewed and updated as appropriate: allergies, current medications, past family history, past medical history, past social history, past surgical history and problem list.  Review of Systems Pertinent items are noted in HPI.   Objective:  BP 121/83   Pulse 98   Temp 98.3 F (36.8 C)   Ht 5\' 4"  (1.626 m)   Wt (!) 306 lb 11.2 oz (139.1 kg)   LMP  (LMP Unknown)   BMI 52.64 kg/m  CONSTITUTIONAL: Well-developed, well-nourished female in no acute distress.  HENT:  Normocephalic, atraumatic, External right and left ear normal. Oropharynx is clear and moist EYES: Conjunctivae and EOM are normal. Pupils are equal, round, and reactive to light. No scleral icterus.  NECK: Normal range of motion, supple, no masses.  Normal thyroid.  SKIN: Skin is warm and dry. No rash noted. Not diaphoretic. No erythema. No pallor. NEUROLOGIC: Alert and oriented to person, place, and time. Normal reflexes, muscle tone coordination. No cranial nerve deficit noted. PSYCHIATRIC: Normal mood and affect. Normal behavior. Normal judgment and thought content. CARDIOVASCULAR: Normal heart rate noted, regular rhythm RESPIRATORY: Clear to auscultation bilaterally. Effort and breath sounds normal, no problems with respiration noted. BREASTS: Symmetric in size. No masses, skin changes, nipple drainage, or lymphadenopathy. ABDOMEN: Soft, normal bowel sounds, no distention noted.  No tenderness, rebound or guarding.  PELVIC: Normal appearing external genitalia.  No abnormal discharge noted.  Normal uterine size, no other palpable masses, no uterine or adnexal tenderness. MUSCULOSKELETAL: Normal range of motion. No tenderness.  No cyanosis, clubbing, or edema.  2+ distal pulses.  Exam done with chaperone present.   Assessment and Plan:  1. Well woman exam with routine gynecological exam - RPR - HepB+HepC+HIV Panel - HIV antibody (with reflex) - GC/Chlamydia probe amp ([place])not at [facility] - previous Pap 12/2017 was normal, HPV neg; next due 12/2020  Will follow up results of STI screen and manage accordingly. Encouraged improvement in diet and exercise.  Flu vaccine not given today, received at work 01/2021 - first dose given today Birth control: Depo shots q3 months  Routine preventative health maintenance measures emphasized. Please refer to After Visit Summary for other counseling recommendations.   2. Encounter for surveillance of injectable contraceptive - medroxyPROGESTERone (  DEPO-PROVERA) 150 MG/ML injection; Inject 1 mL (150 mg total) into the muscle every 3 (three) months.  Dispense: 1 mL; Refill: 3   Total face-to-face time with patient: 20 minutes. Over 50% of encounter was spent on counseling and coordination of care.   Merilyn Baba, DO OB Fellow, Faculty Practice 01/18/2019 11:14 AM

## 2019-01-18 NOTE — Addendum Note (Signed)
Addended by: Tamela Oddi on: 01/18/2019 11:28 AM   Modules accepted: Orders

## 2019-01-19 LAB — HEPB+HEPC+HIV PANEL
HIV Screen 4th Generation wRfx: NONREACTIVE
Hep B C IgM: NEGATIVE
Hep B Core Total Ab: NEGATIVE
Hep B E Ab: NEGATIVE
Hep B E Ag: NEGATIVE
Hep B Surface Ab, Qual: REACTIVE
Hep C Virus Ab: <0.1 s/co ratio (ref 0.0–0.9)
Hepatitis B Surface Ag: NEGATIVE

## 2019-01-19 LAB — RPR: RPR Ser Ql: NONREACTIVE

## 2019-01-23 LAB — GC/CHLAMYDIA PROBE AMP (~~LOC~~) NOT AT ARMC
Chlamydia: NEGATIVE
Comment: NEGATIVE
Comment: NORMAL
Neisseria Gonorrhea: NEGATIVE

## 2019-02-06 ENCOUNTER — Telehealth: Payer: Self-pay | Admitting: Internal Medicine

## 2019-02-06 ENCOUNTER — Other Ambulatory Visit: Payer: Self-pay | Admitting: Critical Care Medicine

## 2019-02-06 DIAGNOSIS — I10 Essential (primary) hypertension: Secondary | ICD-10-CM

## 2019-02-06 NOTE — Telephone Encounter (Signed)
1) Medication(s) Requested (by name): hydrochlorothiazide (HYDRODIURIL) 25 MG tablet   2) Pharmacy of Choice: [facility] ([facility]), [place] - [address] 3) Special Requests:   Approved medications will be sent to the pharmacy, we will reach out if there is an issue.  Requests made after 3pm may not be addressed until the following business day!  If a patient is unsure of the name of the medication(s) please note and ask patient to call back when they are able to provide all info, do not send to responsible party until all information is available!

## 2019-02-06 NOTE — Telephone Encounter (Signed)
Pt has an appointment on 11/5 with the walkin provider. Pt last seen by Dr. Wynetta Emery 12/30/17 and by Dr. Joya Gaskins 07/05/18. Pt will have to wait till Thursday.

## 2019-02-09 ENCOUNTER — Ambulatory Visit: Payer: Self-pay | Attending: Family Medicine | Admitting: Physician Assistant

## 2019-02-09 ENCOUNTER — Other Ambulatory Visit: Payer: Self-pay

## 2019-02-09 VITALS — BP 138/58 | HR 87 | Temp 97.8°F | Ht 64.0 in | Wt 312.8 lb

## 2019-02-09 DIAGNOSIS — E876 Hypokalemia: Secondary | ICD-10-CM

## 2019-02-09 DIAGNOSIS — Z86711 Personal history of pulmonary embolism: Secondary | ICD-10-CM

## 2019-02-09 DIAGNOSIS — Z131 Encounter for screening for diabetes mellitus: Secondary | ICD-10-CM

## 2019-02-09 DIAGNOSIS — I10 Essential (primary) hypertension: Secondary | ICD-10-CM

## 2019-02-09 MED ORDER — HYDROCHLOROTHIAZIDE 25 MG PO TABS: ORAL_TABLET | ORAL | 1 refills | Status: DC

## 2019-02-09 MED ORDER — POTASSIUM CHLORIDE ER 20 MEQ PO TBCR: 20.0000 meq | EXTENDED_RELEASE_TABLET | Freq: Every day | ORAL | 1 refills | Status: DC

## 2019-02-09 MED ORDER — RIVAROXABAN 20 MG PO TABS: ORAL_TABLET | ORAL | 2 refills | Status: DC

## 2019-02-09 NOTE — Progress Notes (Signed)
Patient ID: Karen Robles, female   DOB: 06-01-1980, 38 y.o.   MRN: [id number]   Karen Robles, is a 38 y.o. female  FTD:[id number]  CWC:[id number]  DOB - 1981-02-15  Subjective:  Chief Complaint and HPI: Karen Robles is a 38 y.o. female here today for medication RF.  She has been out of meds for 6 days.  She denies HA/dizziness/SOB.  She had her flu shot 2 weeks ago at [employer] where she works.    ROS:   Constitutional:  No f/c, No night sweats, No unexplained weight loss. EENT:  No vision changes, No blurry vision, No hearing changes. No mouth, throat, or ear problems.  Respiratory: No cough, No SOB Cardiac: No CP, no palpitations GI:  No abd pain, No N/V/D. GU: No Urinary s/sx Musculoskeletal: No joint pain Neuro: No headache, no dizziness, no motor weakness.  Skin: No rash Endocrine:  No polydipsia. No polyuria.  Psych: Denies SI/HI  No problems updated.  ALLERGIES: No Known Allergies  PAST MEDICAL HISTORY: Past Medical History:  Diagnosis Date  . Hypertension   . Obesity   . Pulmonary embolism (HCC)     MEDICATIONS AT HOME: Prior to Admission medications   Medication Sig Start Date End Date Taking? Authorizing Provider  acetaminophen (TYLENOL) 500 MG tablet Take 1,000 mg by mouth every 6 (six) hours as needed for mild pain.   Yes [provider]  medroxyPROGESTERone (DEPO-PROVERA) 150 MG/ML injection Inject 1 mL (150 mg total) into the muscle every 3 (three) months. 01/18/19  Yes Sparacino, Hailey L, DO  omeprazole (PRILOSEC) 20 MG capsule Take 1 capsule (20 mg total) by mouth daily. 09/27/18  Yes Wieters, Hallie C, PA-C  Potassium Chloride ER 20 MEQ TBCR Take 20 mEq by mouth daily. 02/09/19  Yes ,  M, PA-C  rivaroxaban (XARELTO) 20 MG TABS tablet TAKE ONE TABLET BY MOUTH ONCE DAILY WITH SUPPER 02/09/19  Yes Georgian Co M, PA-C  aluminum-magnesium hydroxide-simethicone (MAALOX) 200-200-20 MG/5ML SUSP Take 30 mLs by mouth 4 (four) times  daily -  before meals and at bedtime. Patient not taking: Reported on 01/18/2019 09/27/18   Wieters, Hallie C, PA-C  hydrochlorothiazide (HYDRODIURIL) 25 MG tablet TAKE 1 TABLET BY MOUTH ONCE DAILY 02/09/19   Anders Simmonds, PA-C     Objective:  EXAM:   Vitals:   02/09/19 1457  BP: (!) 138/58  Pulse: 87  Temp: 97.8 F (36.6 C)  TempSrc: Oral  Weight: (!) 312 lb 12.8 oz (141.9 kg)  Height: 5\' 4"  (1.626 m)    General appearance : A&OX3. NAD. Non-toxic-appearing HEENT: Atraumatic and Normocephalic.  PERRLA. EOM intact.  Neck: supple, no JVD. No cervical lymphadenopathy. No thyromegaly Chest/Lungs:  Breathing-non-labored, Good air entry bilaterally, breath sounds normal without rales, rhonchi, or wheezing  CVS: S1 S2 regular, no murmurs, gallops, rubs  Extremities: Bilateral Lower Ext shows no edema, both legs are warm to touch with = pulse throughout Neurology:  CN II-XII grossly intact, Non focal.   Psych:  TP linear. J/I WNL. Normal speech. Appropriate eye contact and affect.  Skin:  No Rash  Data Review Lab Results  Component Value Date   HGBA1C 5.3 04/14/2016   HGBA1C 5.7 (H) 02/08/2013     Assessment & Plan   1. Essential hypertension Resume medications and check BP OOO - hydrochlorothiazide (HYDRODIURIL) 25 MG tablet; TAKE 1 TABLET BY MOUTH ONCE DAILY  Dispense: 90 tablet; Refill: 1 - Basic metabolic panel - Potassium Chloride ER 20 MEQ TBCR;  Take 20 mEq by mouth daily.  Dispense: 90 tablet; Refill: 1  2. Screening for diabetes mellitus I have had a lengthy discussion and provided education about insulin resistance and the intake of too much sugar/refined carbohydrates.  I have advised the patient to work at a goal of eliminating sugary drinks, candy, desserts, sweets, refined sugars, processed foods, and white carbohydrates.  The patient expresses understanding.  - Hemoglobin A1c  3. Hypokalemia - Potassium Chloride ER 20 MEQ TBCR; Take 20 mEq by mouth daily.   Dispense: 90 tablet; Refill: 1  4. History of pulmonary embolism - rivaroxaban (XARELTO) 20 MG TABS tablet; TAKE ONE TABLET BY MOUTH ONCE DAILY WITH SUPPER  Dispense: 60 tablet; Refill: 2  Patient have been counseled extensively about nutrition and exercise  Return in about 3 months (around 05/12/2019) for PCP-chronic conditions.  The patient was given clear instructions to go to ER or return to medical center if symptoms don't improve, worsen or new problems develop. The patient verbalized understanding. The patient was told to call to get lab results if they haven't heard anything in the next week.     Freeman Caldron, PA-C [facility] and [facility], [place]   02/09/2019, 3:54 PM

## 2019-02-10 LAB — HEMOGLOBIN A1C
Est. average glucose Bld gHb Est-mCnc: 117 mg/dL
Hgb A1c MFr Bld: 5.7 % — ABNORMAL HIGH (ref 4.8–5.6)

## 2019-02-10 LAB — BASIC METABOLIC PANEL
BUN/Creatinine Ratio: 16 (ref 9–23)
BUN: 17 mg/dL (ref 6–20)
CO2: 24 mmol/L (ref 20–29)
Calcium: 8.8 mg/dL (ref 8.7–10.2)
Chloride: 106 mmol/L (ref 96–106)
Creatinine, Ser: 1.09 mg/dL — ABNORMAL HIGH (ref 0.57–1.00)
GFR calc Af Amer: 75 mL/min/{1.73_m2} (ref >59)
GFR calc non Af Amer: 65 mL/min/{1.73_m2} (ref >59)
Glucose: 86 mg/dL (ref 65–99)
Potassium: 3.9 mmol/L (ref 3.5–5.2)
Sodium: 143 mmol/L (ref 134–144)

## 2019-02-20 ENCOUNTER — Other Ambulatory Visit: Payer: Self-pay | Admitting: Critical Care Medicine

## 2019-02-20 DIAGNOSIS — Z86711 Personal history of pulmonary embolism: Secondary | ICD-10-CM

## 2019-02-20 MED FILL — XARELTO 20 MG TABLET: 20 | 30 days supply | Qty: 30 | Fill #0

## 2019-02-27 ENCOUNTER — Ambulatory Visit: Payer: Medicaid Other

## 2019-02-27 ENCOUNTER — Other Ambulatory Visit: Payer: Self-pay

## 2019-03-10 ENCOUNTER — Other Ambulatory Visit: Payer: Self-pay

## 2019-03-10 ENCOUNTER — Ambulatory Visit (INDEPENDENT_AMBULATORY_CARE_PROVIDER_SITE_OTHER): Payer: Medicaid Other

## 2019-03-10 VITALS — BP 132/87 | HR 96 | Ht 63.0 in | Wt 309.0 lb

## 2019-03-10 DIAGNOSIS — Z3042 Encounter for surveillance of injectable contraceptive: Secondary | ICD-10-CM | POA: Diagnosis not present

## 2019-03-10 MED ORDER — MEDROXYPROGESTERONE ACETATE 150 MG/ML IM SUSP
150.0000 mg | Freq: Once | INTRAMUSCULAR | Status: AC
  Administered 2019-03-10: 150 mg via INTRAMUSCULAR

## 2019-03-10 NOTE — Progress Notes (Addendum)
Presents for DEPO, given in RD, tolerated well.    Next DEPO Feb. 19 - Mar. 08/2019  Administrations This Visit    medroxyPROGESTERone (DEPO-PROVERA) injection 150 mg    Admin Date 03/10/2019 Action Given Dose 150 mg Route Intramuscular Administered By Tamela Oddi, RMA

## 2019-03-20 ENCOUNTER — Ambulatory Visit: Payer: Medicaid Other

## 2019-03-21 MED FILL — XARELTO 20 MG TABLET: 20 | 30 days supply | Qty: 30 | Fill #1

## 2019-03-22 ENCOUNTER — Ambulatory Visit: Payer: Medicaid Other

## 2019-04-19 MED FILL — XARELTO 20 MG TABLET: 20 | 30 days supply | Qty: 30 | Fill #2

## 2019-05-31 ENCOUNTER — Telehealth: Payer: Self-pay | Admitting: Internal Medicine

## 2019-05-31 NOTE — Telephone Encounter (Signed)
Patient called and wanted to speak with someone regarding her covid19 concerns. Patient tested positive for covid 19 today. Please follow up at your earliest convenience.

## 2019-05-31 NOTE — Telephone Encounter (Signed)
Pt is asking if possible Zacam and D3 multivitamin while beeing on a blood thiner? Please advice

## 2019-05-31 NOTE — Telephone Encounter (Signed)
Patient called to inform PCP that she tested positive for covid 19 today. Patient requested to speak with pcp regarding some concerns she has regarding the matter. Please follow up at your earliest convenience.

## 2019-06-02 NOTE — Telephone Encounter (Signed)
Made pt aware of MD note/ verbalized understanding

## 2019-06-08 ENCOUNTER — Ambulatory Visit: Payer: Medicaid Other

## 2019-07-12 ENCOUNTER — Other Ambulatory Visit: Payer: Self-pay | Admitting: Internal Medicine

## 2019-07-12 DIAGNOSIS — Z86711 Personal history of pulmonary embolism: Secondary | ICD-10-CM

## 2019-07-13 MED FILL — XARELTO 20 MG TABLET: 20 | 30 days supply | Qty: 30 | Fill #0

## 2019-08-14 ENCOUNTER — Other Ambulatory Visit: Payer: Self-pay | Admitting: Internal Medicine

## 2019-08-14 DIAGNOSIS — Z86711 Personal history of pulmonary embolism: Secondary | ICD-10-CM

## 2019-08-15 ENCOUNTER — Telehealth: Payer: Self-pay | Admitting: Internal Medicine

## 2019-08-15 ENCOUNTER — Other Ambulatory Visit: Payer: Self-pay | Admitting: Internal Medicine

## 2019-08-15 DIAGNOSIS — I10 Essential (primary) hypertension: Secondary | ICD-10-CM

## 2019-08-15 DIAGNOSIS — Z86711 Personal history of pulmonary embolism: Secondary | ICD-10-CM

## 2019-08-15 DIAGNOSIS — E876 Hypokalemia: Secondary | ICD-10-CM

## 2019-08-15 NOTE — Telephone Encounter (Signed)
Patient called and requested for listed medications to be refilled and sent to [facility].  Potassium Chloride ER 20 MEQ TBCR [[id number]]  hydrochlorothiazide (HYDRODIURIL) 25 MG tablet [[id number]]

## 2019-08-16 MED ORDER — HYDROCHLOROTHIAZIDE 25 MG PO TABS: ORAL_TABLET | ORAL | 0 refills | Status: DC

## 2019-08-16 MED ORDER — POTASSIUM CHLORIDE ER 20 MEQ PO TBCR: 20.0000 meq | EXTENDED_RELEASE_TABLET | Freq: Every day | ORAL | 0 refills | Status: DC

## 2019-08-16 NOTE — Addendum Note (Signed)
Addended by: [name] B on: 08/16/2019 09:49 AM   Modules accepted: Orders

## 2019-08-16 NOTE — Telephone Encounter (Signed)
Contacted pt to schedule a follow up appt pt didn't answer lvm asking pt to give a call back to schedule

## 2019-08-18 ENCOUNTER — Ambulatory Visit (INDEPENDENT_AMBULATORY_CARE_PROVIDER_SITE_OTHER): Payer: Medicaid Other

## 2019-08-18 ENCOUNTER — Other Ambulatory Visit: Payer: Self-pay

## 2019-08-18 ENCOUNTER — Ambulatory Visit: Payer: Self-pay | Attending: Internal Medicine | Admitting: Internal Medicine

## 2019-08-18 VITALS — BP 115/82 | HR 104 | Ht 64.0 in | Wt 315.6 lb

## 2019-08-18 DIAGNOSIS — Z8616 Personal history of COVID-19: Secondary | ICD-10-CM | POA: Insufficient documentation

## 2019-08-18 DIAGNOSIS — Z560 Unemployment, unspecified: Secondary | ICD-10-CM | POA: Insufficient documentation

## 2019-08-18 DIAGNOSIS — Z6841 Body Mass Index (BMI) 40.0 and over, adult: Secondary | ICD-10-CM | POA: Insufficient documentation

## 2019-08-18 DIAGNOSIS — Z3202 Encounter for pregnancy test, result negative: Secondary | ICD-10-CM

## 2019-08-18 DIAGNOSIS — I10 Essential (primary) hypertension: Secondary | ICD-10-CM | POA: Insufficient documentation

## 2019-08-18 DIAGNOSIS — R7303 Prediabetes: Secondary | ICD-10-CM | POA: Insufficient documentation

## 2019-08-18 DIAGNOSIS — Z8249 Family history of ischemic heart disease and other diseases of the circulatory system: Secondary | ICD-10-CM | POA: Insufficient documentation

## 2019-08-18 DIAGNOSIS — Z79899 Other long term (current) drug therapy: Secondary | ICD-10-CM | POA: Insufficient documentation

## 2019-08-18 DIAGNOSIS — Z3200 Encounter for pregnancy test, result unknown: Secondary | ICD-10-CM

## 2019-08-18 DIAGNOSIS — Z7901 Long term (current) use of anticoagulants: Secondary | ICD-10-CM | POA: Insufficient documentation

## 2019-08-18 DIAGNOSIS — Z793 Long term (current) use of hormonal contraceptives: Secondary | ICD-10-CM | POA: Insufficient documentation

## 2019-08-18 DIAGNOSIS — Z86711 Personal history of pulmonary embolism: Secondary | ICD-10-CM | POA: Insufficient documentation

## 2019-08-18 DIAGNOSIS — Z833 Family history of diabetes mellitus: Secondary | ICD-10-CM | POA: Insufficient documentation

## 2019-08-18 LAB — POCT URINE PREGNANCY: Preg Test, Ur: NEGATIVE

## 2019-08-18 NOTE — Progress Notes (Signed)
I have reviewed this chart and agree with the RN/CMA assessment and management.    K. Meryl Roxy Mastandrea, M.D. Attending [facility] (Faculty Practice)   

## 2019-08-18 NOTE — Progress Notes (Signed)
Karen Robles is here for UPT for depo restart. UPT negative. LMP was at the beginning of the month. Pt advised to come back in for another UPT and if that is negative then she can get depo the same day. -EH/RMA

## 2019-08-18 NOTE — Patient Instructions (Signed)

## 2019-08-18 NOTE — Progress Notes (Signed)
Patient ID: Karen Robles, female    DOB: Jan 22, 1981  MRN: [id number]  CC: Hypertension   Subjective: Karen Robles is a 39 y.o. female who presents for chronic ds management Her concerns today include:  Pt with hx of PE in 2015 unprovoked, HTN, COVID infection 05/2019 and morbid obesity.  HM:  Receive [employer] vaccine series  HYPERTENSION Currently taking: see medication list Med Adherence: [x]  Yes    []  No Medication side effects: []  Yes    [x]  No Adherence with salt restriction: [x]  Yes    []  No Home Monitoring?: []  Yes    [x]  No but does have a device Monitoring Frequency: []  Yes    []  No Home BP results range: []  Yes    []  No SOB? []  Yes    [x]  No Chest Pain?: []  Yes    [x]  No Leg swelling?: []  Yes    [x]  No Headaches?: []  Yes    [x]  No Dizziness? []  Yes    [x]  No Comments:   Obesity/PreDM:  Gained 10 lbs since 12/2019 + carbs, drinks Snapple Juices, sodas some Get exercise at work at , She was prediabetic on A1c done last fall Hx of PE:  Taking Xarelto.  No bruising or bleeding  Patient Active Problem List   Diagnosis Date Noted  . History of pulmonary embolism 07/22/2017  . Surveillance of contraceptive injection 06/11/2016  . Severe obesity (BMI >= 40) (HCC) 07/26/2013  . Essential hypertension 03/07/2013  . Chlamydia infection 02/11/2013     Current Outpatient Medications on File Prior to Visit  Medication Sig Dispense Refill  . acetaminophen (TYLENOL) 500 MG tablet Take 1,000 mg by mouth every 6 (six) hours as needed for mild pain.    . hydrochlorothiazide (HYDRODIURIL) 25 MG tablet TAKE 1 TABLET BY MOUTH ONCE DAILY 90 tablet 0  . medroxyPROGESTERone (DEPO-PROVERA) 150 MG/ML injection Inject 1 mL (150 mg total) into the muscle every 3 (three) months. 1 mL 3  . Potassium Chloride ER 20 MEQ TBCR Take 20 mEq by mouth daily. 90 tablet 0  . rivaroxaban (XARELTO) 20 MG TABS tablet Take 1 tablet (20 mg total) by mouth  daily with supper. Must have office visit for refills 30 tablet 0  . aluminum-magnesium hydroxide-simethicone (MAALOX) 200-200-20 MG/5ML SUSP Take 30 mLs by mouth 4 (four) times daily -  before meals and at bedtime. (Patient not taking: Reported on 01/18/2019) 710 mL 0  . omeprazole (PRILOSEC) 20 MG capsule Take 1 capsule (20 mg total) by mouth daily. (Patient not taking: Reported on 08/18/2019) 30 capsule 0   Current Facility-Administered Medications on File Prior to Visit  Medication Dose Route Frequency Provider Last Rate Last Admin  . medroxyPROGESTERone (DEPO-PROVERA) injection 150 mg  150 mg Intramuscular Q90 days , MD   150 mg at 06/22/18 1123    No Known Allergies  Social History   Socioeconomic History  . Marital status: Single    Spouse name: Not on file  . Number of children: 2  . Years of education: Not on file  . Highest education level: Not on file  Occupational History  . Occupation: Unemployed  Tobacco Use  . Smoking status: Never Smoker  . Smokeless tobacco: Never Used  Substance and Sexual Activity  . Alcohol use: No    Alcohol/week: 0.0 standard drinks  . Drug use: No  . Sexual activity: Yes    Partners: Male    Birth control/protection:  Injection  Other Topics Concern  . Not on file  Social History Narrative  . Not on file   Social Determinants of Health   Financial Resource Strain:   . Difficulty of Paying Living Expenses:   Food Insecurity:   . Worried About Charity fundraiser in the Last Year:   . Arboriculturist in the Last Year:   Transportation Needs:   . Film/video editor (Medical):   Marland Kitchen Lack of Transportation (Non-Medical):   Physical Activity:   . Days of Exercise per Week:   . Minutes of Exercise per Session:   Stress:   . Feeling of Stress :   Social Connections:   . Frequency of Communication with Friends and Family:   . Frequency of Social Gatherings with Friends and Family:   . Attends Religious Services:     . Active Member of Clubs or Organizations:   . Attends Archivist Meetings:   Marland Kitchen Marital Status:   Intimate Partner Violence:   . Fear of Current or Ex-Partner:   . Emotionally Abused:   Marland Kitchen Physically Abused:   . Sexually Abused:     Family History  Problem Relation Age of Onset  . Hypertension Mother        maternal aunts & uncles  . Hyperlipidemia Mother   . Healthy Brother   . Hypertension Maternal Aunt   . Hypertension Maternal Uncle   . Cancer Maternal Grandmother   . Diabetes Neg Hx   . Heart disease Neg Hx     Past Surgical History:  Procedure Laterality Date  . MOUTH SURGERY    . WISDOM TOOTH EXTRACTION Bilateral     ROS: Review of Systems Negative except as stated above  PHYSICAL EXAM: BP 115/82   Pulse (!) 104   Ht 5\' 4"  (1.626 m)   Wt (!) 315 lb 9.6 oz (143.2 kg)   SpO2 97%   BMI 54.17 kg/m   Wt Readings from Last 3 Encounters:  08/18/19 (!) 315 lb 9.6 oz (143.2 kg)  03/10/19 (!) 309 lb (140.2 kg)  02/09/19 (!) 312 lb 12.8 oz (141.9 kg)    Physical Exam  General appearance - alert, well appearing, and in no distress Mental status - normal mood, behavior, speech, dress, motor activity, and thought processes Neck - supple, no significant adenopathy Chest - clear to auscultation, no wheezes, rales or rhonchi, symmetric air entry Heart - normal rate, regular rhythm, normal S1, S2, no murmurs, rubs, clicks or gallops Extremities - peripheral pulses normal, no pedal edema, no clubbing or cyanosis   CMP Latest Ref Rng & Units 02/09/2019 12/30/2017 07/22/2017  Glucose 65 - 99 mg/dL 86 52(L) 62(L)  BUN 6 - 20 mg/dL 17 12 20   Creatinine 0.57 - 1.00 mg/dL 1.09(H) 0.90 1.04(H)  Sodium 134 - 144 mmol/L 143 143 140  Potassium 3.5 - 5.2 mmol/L 3.9 3.4(L) 3.1(L)  Chloride 96 - 106 mmol/L 106 104 100  CO2 20 - 29 mmol/L 24 22 24   Calcium 8.7 - 10.2 mg/dL 8.8 8.6(L) 9.1  Total Protein 6.5 - 8.1 g/dL - - -  Total Bilirubin 0.3 - 1.2 mg/dL - - -   Alkaline Phos 38 - 126 U/L - - -  AST 15 - 41 U/L - - -  ALT 14 - 54 U/L - - -   Lipid Panel     Component Value Date/Time   CHOL 143 09/05/2013 1148   TRIG 120 09/05/2013 1148  HDL 37 (L) 09/05/2013 1148   CHOLHDL 3.9 09/05/2013 1148   VLDL 24 09/05/2013 1148   LDLCALC 82 09/05/2013 1148    CBC    Component Value Date/Time   WBC 8.7 07/22/2017 0958   WBC 9.3 04/14/2016 0918   RBC 4.75 07/22/2017 0958   RBC 4.82 04/14/2016 0918   HGB 12.7 07/22/2017 0958   HCT 38.5 07/22/2017 0958   PLT 386 (H) 07/22/2017 0958   MCV 81 07/22/2017 0958   MCH 26.7 07/22/2017 0958   MCH 26.6 (L) 04/14/2016 0918   MCHC 33.0 07/22/2017 0958   MCHC 32.7 04/14/2016 0918   RDW 14.4 07/22/2017 0958   LYMPHSABS 3,255 04/14/2016 0918   MONOABS 279 04/14/2016 0918   EOSABS 93 04/14/2016 0918   BASOSABS 93 04/14/2016 0918    ASSESSMENT AND PLAN: 1. Essential hypertension Close to goal.  Continue current medication and low-salt diet - Comprehensive metabolic panel  2. History of pulmonary embolism Continue Xarelto - CBC  3. Class 3 severe obesity due to excess calories with serious comorbidity and body mass index (BMI) of 50.0 to 59.9 in adult [facility]) Dietary counseling given. Encouraged her to engage in some form of moderate intensity exercise at least 3 to 4 days a week for 30 to 45 minutes with goal of getting in 150 minutes/week total  4. Prediabetes See #3 above - Lipid panel - Hemoglobin A1c     Patient was given the opportunity to ask questions.  Patient verbalized understanding of the plan and was able to repeat key elements of the plan.   Orders Placed This Encounter  Procedures  . CBC  . Comprehensive metabolic panel  . Lipid panel  . Hemoglobin A1c     Requested Prescriptions    No prescriptions requested or ordered in this encounter    Return in about 6 months (around 02/18/2020).  [name], MD, FACP

## 2019-08-19 LAB — CBC
Hematocrit: 41.1 % (ref 34.0–46.6)
Hemoglobin: 13.3 g/dL (ref 11.1–15.9)
MCH: 27.8 pg (ref 26.6–33.0)
MCHC: 32.4 g/dL (ref 31.5–35.7)
MCV: 86 fL (ref 79–97)
Platelets: 464 10*3/uL — ABNORMAL HIGH (ref 150–450)
RBC: 4.79 x10E6/uL (ref 3.77–5.28)
RDW: 13.7 % (ref 11.7–15.4)
WBC: 13.6 10*3/uL — ABNORMAL HIGH (ref 3.4–10.8)

## 2019-08-19 LAB — COMPREHENSIVE METABOLIC PANEL
ALT: 22 IU/L (ref 0–32)
AST: 21 IU/L (ref 0–40)
Albumin/Globulin Ratio: 0.9 — ABNORMAL LOW (ref 1.2–2.2)
Albumin: 3.6 g/dL — ABNORMAL LOW (ref 3.8–4.8)
Alkaline Phosphatase: 94 IU/L (ref 39–117)
BUN/Creatinine Ratio: 15 (ref 9–23)
BUN: 14 mg/dL (ref 6–20)
Bilirubin Total: <0.2 mg/dL (ref 0.0–1.2)
CO2: 24 mmol/L (ref 20–29)
Calcium: 9 mg/dL (ref 8.7–10.2)
Chloride: 102 mmol/L (ref 96–106)
Creatinine, Ser: 0.94 mg/dL (ref 0.57–1.00)
GFR calc Af Amer: 90 mL/min/{1.73_m2} (ref >59)
GFR calc non Af Amer: 78 mL/min/{1.73_m2} (ref >59)
Globulin, Total: 4 g/dL (ref 1.5–4.5)
Glucose: 74 mg/dL (ref 65–99)
Potassium: 3.9 mmol/L (ref 3.5–5.2)
Sodium: 139 mmol/L (ref 134–144)
Total Protein: 7.6 g/dL (ref 6.0–8.5)

## 2019-08-19 LAB — LIPID PANEL
Chol/HDL Ratio: 4.1 ratio (ref 0.0–4.4)
Cholesterol, Total: 154 mg/dL (ref 100–199)
HDL: 38 mg/dL — ABNORMAL LOW (ref >39)
LDL Chol Calc (NIH): 91 mg/dL (ref 0–99)
Triglycerides: 142 mg/dL (ref 0–149)
VLDL Cholesterol Cal: 25 mg/dL (ref 5–40)

## 2019-08-19 LAB — HEMOGLOBIN A1C
Est. average glucose Bld gHb Est-mCnc: 111 mg/dL
Hgb A1c MFr Bld: 5.5 % (ref 4.8–5.6)

## 2019-08-21 ENCOUNTER — Other Ambulatory Visit: Payer: Self-pay | Admitting: Internal Medicine

## 2019-08-21 DIAGNOSIS — Z86711 Personal history of pulmonary embolism: Secondary | ICD-10-CM

## 2019-08-21 MED FILL — XARELTO 20 MG TABLET: 20 | 30 days supply | Qty: 30 | Fill #0

## 2019-09-07 ENCOUNTER — Ambulatory Visit (INDEPENDENT_AMBULATORY_CARE_PROVIDER_SITE_OTHER): Payer: Medicaid Other

## 2019-09-07 ENCOUNTER — Other Ambulatory Visit: Payer: Self-pay

## 2019-09-07 DIAGNOSIS — Z3042 Encounter for surveillance of injectable contraceptive: Secondary | ICD-10-CM | POA: Diagnosis not present

## 2019-09-07 DIAGNOSIS — Z3202 Encounter for pregnancy test, result negative: Secondary | ICD-10-CM | POA: Diagnosis not present

## 2019-09-07 LAB — POCT URINE PREGNANCY: Preg Test, Ur: NEGATIVE

## 2019-09-07 MED ORDER — MEDROXYPROGESTERONE ACETATE 150 MG/ML IM SUSP
150.0000 mg | INTRAMUSCULAR | Status: DC
  Administered 2019-09-07: 150 mg via INTRAMUSCULAR

## 2019-09-07 NOTE — Progress Notes (Signed)
Pt is in the office for depo re-start, 2nd UPT is negative today. Administered depo in R del and pt tolerated well. Next due Aug 19- Sept 2. .. Administrations This Visit    medroxyPROGESTERone (DEPO-PROVERA) injection 150 mg    Admin Date 09/07/2019 Action Given Dose 150 mg Route Intramuscular Administered By Katrina Stack, RN

## 2019-09-18 MED FILL — XARELTO 20 MG TABLET: 20 | 30 days supply | Qty: 30 | Fill #1

## 2019-10-18 MED FILL — XARELTO 20 MG TABLET: 20 | 30 days supply | Qty: 30 | Fill #2

## 2019-11-13 ENCOUNTER — Other Ambulatory Visit: Payer: Self-pay | Admitting: Physician Assistant

## 2019-11-13 ENCOUNTER — Other Ambulatory Visit: Payer: Self-pay | Admitting: Internal Medicine

## 2019-11-13 DIAGNOSIS — I10 Essential (primary) hypertension: Secondary | ICD-10-CM

## 2019-11-13 DIAGNOSIS — E876 Hypokalemia: Secondary | ICD-10-CM

## 2019-11-13 NOTE — Telephone Encounter (Signed)
Requested medication (s) are due for refill today: yes  Requested medication (s) are on the active medication list: yes  Last refill:  05/12/2019  Future visit scheduled: no  Notes to clinic:  please review for refill Wanted to make sure medication didn't change   Requested Prescriptions  Pending Prescriptions Disp Refills   potassium chloride SA (KLOR-CON) 20 MEQ tablet [Pharmacy Med Name: Potassium Chloride Crys ER 20 MEQ Oral Tablet Extended Release] 90 tablet 0    Sig: Take 1 tablet by mouth once daily      Endocrinology:  Minerals - Potassium Supplementation Passed - 11/13/2019  8:11 AM      Passed - K in normal range and within 360 days    Potassium  Date Value Ref Range Status  08/18/2019 3.9 3.5 - 5.2 mmol/L Final          Passed - Cr in normal range and within 360 days    Creat  Date Value Ref Range Status  04/14/2016 1.09 0.50 - 1.10 mg/dL Final   Creatinine, Ser  Date Value Ref Range Status  08/18/2019 0.94 0.57 - 1.00 mg/dL Final          Passed - Valid encounter within last 12 months    Recent Outpatient Visits           2 months ago Essential hypertension   [facility] Marcine Matar, MD   9 months ago Screening for diabetes mellitus   [facility] [place], [place], [place]   1 year ago Essential hypertension   [facility] Storm Frisk, MD   1 year ago Hypokalemia   [facility] [place], [place], [place]   2 years ago Severe obesity (BMI >= 40) [facility])   [facility] Marcine Matar, MD

## 2019-11-14 ENCOUNTER — Other Ambulatory Visit: Payer: Self-pay | Admitting: Internal Medicine

## 2019-11-14 DIAGNOSIS — Z86711 Personal history of pulmonary embolism: Secondary | ICD-10-CM

## 2019-11-14 MED FILL — XARELTO 20 MG TABLET: 20 | 30 days supply | Qty: 30 | Fill #0

## 2019-11-29 ENCOUNTER — Ambulatory Visit: Payer: Medicaid Other

## 2019-11-30 ENCOUNTER — Other Ambulatory Visit: Payer: Self-pay

## 2019-11-30 ENCOUNTER — Ambulatory Visit (INDEPENDENT_AMBULATORY_CARE_PROVIDER_SITE_OTHER): Payer: Medicaid Other

## 2019-11-30 VITALS — BP 144/91 | HR 96 | Wt 306.0 lb

## 2019-11-30 DIAGNOSIS — Z3042 Encounter for surveillance of injectable contraceptive: Secondary | ICD-10-CM | POA: Diagnosis not present

## 2019-11-30 MED ORDER — MEDROXYPROGESTERONE ACETATE 150 MG/ML IM SUSP
150.0000 mg | Freq: Once | INTRAMUSCULAR | Status: AC
  Administered 2019-11-30: 150 mg via INTRAMUSCULAR

## 2019-11-30 NOTE — Progress Notes (Signed)
GYN presents for DEPO, given in RD, tolerated well.  Needs to have an Annual Exam after 12/29/2019.  Last PAP 12/28/2017  Next DEPO Nov. 10-24, 2021  Administrations This Visit    medroxyPROGESTERone (DEPO-PROVERA) injection 150 mg    Admin Date 11/30/2019 Action Given Dose 150 mg Route Intramuscular Administered By Maretta Bees, RMA

## 2019-12-12 MED FILL — XARELTO 20 MG TABLET: 20 | 30 days supply | Qty: 30 | Fill #1

## 2020-01-01 ENCOUNTER — Ambulatory Visit: Payer: Medicaid Other | Admitting: Advanced Practice Midwife

## 2020-01-01 NOTE — Progress Notes (Deleted)
Subjective:     Karen Robles is a 39 y.o. female here at [facility] *** for a routine exam.  Current complaints: ***.  Personal health questionnaire reviewed: {yes/no:9010}.  Do you have a primary care provider? *** Do you feel safe at home? ***    Office Visit from 08/18/2019 in [facility]  PHQ-2 Total Score 0      Risk factors for chronic health problems: Smoking: Alchohol/how much: Pt BMI: There is no height or weight on file to calculate BMI.   Gynecologic History No LMP recorded. Patient has had an injection. Contraception: {method:5051} Last Pap: ***. Results were: {norm/abn:16337} Last mammogram: ***. Results were: {norm/abn:16337}  Obstetric History OB History  Gravida Para Term Preterm AB Living  2 2 2     2   SAB TAB Ectopic Multiple Live Births          2    # Outcome Date GA Lbr Len/2nd Weight Sex Delivery Anes PTL Lv  2 Term 2012 [redacted]w[redacted]d   F    LIV  1 Term 2004   6 lb 4 oz (2.835 kg) F    LIV     {Common ambulatory SmartLinks:19316}  Review of Systems {ros; complete:30496}    Objective:   There were no vitals taken for this visit. VS reviewed, nursing note reviewed,  Constitutional: well developed, well nourished, no distress HEENT: normocephalic CV: normal rate Pulm/chest wall: normal effort Breast Exam:  ***Deferred with low risks and shared decision making, discussed recommendation to start mammogram between 40-50 yo/ exam performed: right breast normal without mass, skin or nipple changes or axillary nodes, left breast normal without mass, skin or nipple changes or axillary nodes Abdomen: soft Neuro: alert and oriented x 3 Skin: warm, dry Psych: affect normal Pelvic exam: ***Deferred/ Performed: Cervix pink, visually closed, without lesion, scant white creamy discharge, vaginal walls and external genitalia normal Bimanual exam: Cervix 0/long/high, firm, anterior, neg CMT, uterus nontender, nonenlarged, adnexa without  tenderness, enlargement, or mass       Assessment/Plan:   There are no diagnoses linked to this encounter.      Follow up in: {1-10:13787:::0} {time; units:19136:::0} or as needed.   2005, CNM 8:24 AM

## 2020-01-10 ENCOUNTER — Ambulatory Visit (INDEPENDENT_AMBULATORY_CARE_PROVIDER_SITE_OTHER): Payer: Medicaid Other | Admitting: Obstetrics and Gynecology

## 2020-01-10 ENCOUNTER — Other Ambulatory Visit (HOSPITAL_COMMUNITY)
Admission: RE | Admit: 2020-01-10 | Discharge: 2020-01-10 | Disposition: A | Discharge status: Home / Self Care | Payer: Medicaid Other | Source: Ambulatory Visit | Attending: Advanced Practice Midwife | Admitting: Advanced Practice Midwife

## 2020-01-10 ENCOUNTER — Other Ambulatory Visit: Payer: Self-pay

## 2020-01-10 ENCOUNTER — Encounter: Payer: Self-pay | Admitting: Obstetrics and Gynecology

## 2020-01-10 VITALS — BP 137/91 | HR 112 | Ht 62.0 in | Wt 302.0 lb

## 2020-01-10 DIAGNOSIS — Z113 Encounter for screening for infections with a predominantly sexual mode of transmission: Secondary | ICD-10-CM | POA: Insufficient documentation

## 2020-01-10 DIAGNOSIS — Z01419 Encounter for gynecological examination (general) (routine) without abnormal findings: Secondary | ICD-10-CM

## 2020-01-10 DIAGNOSIS — Z3042 Encounter for surveillance of injectable contraceptive: Secondary | ICD-10-CM

## 2020-01-10 NOTE — Progress Notes (Signed)
Patient presents for Annual Exam.  Last Pap: 12/28/2017 WNL no Hx of abnormal paps per pt.   Pt wants pap  Contraception: Depo satisfied w/ this method.  LMP: No cycles with Depo  STD Screening: Pt desires full panel  Family Hx of Breast Cancer: None Family Hx of Ovarian Cancer : None  CC: None

## 2020-01-11 LAB — CERVICOVAGINAL ANCILLARY ONLY
Bacterial Vaginitis (gardnerella): NEGATIVE
Candida Glabrata: NEGATIVE
Candida Vaginitis: NEGATIVE
Chlamydia: NEGATIVE
Comment: NEGATIVE
Comment: NEGATIVE
Comment: NEGATIVE
Comment: NEGATIVE
Comment: NEGATIVE
Comment: NORMAL
Neisseria Gonorrhea: NEGATIVE
Trichomonas: NEGATIVE

## 2020-01-11 LAB — TSH: TSH: 2.24 u[IU]/mL (ref 0.450–4.500)

## 2020-01-11 LAB — HEPATITIS C ANTIBODY: Hep C Virus Ab: 0.1 s/co ratio (ref 0.0–0.9)

## 2020-01-11 LAB — HIV ANTIBODY (ROUTINE TESTING W REFLEX): HIV Screen 4th Generation wRfx: NONREACTIVE

## 2020-01-11 LAB — RPR: RPR Ser Ql: NONREACTIVE

## 2020-01-11 LAB — HEPATITIS B SURFACE ANTIGEN: Hepatitis B Surface Ag: NEGATIVE

## 2020-01-12 NOTE — Progress Notes (Signed)
GYNECOLOGY ANNUAL PREVENTATIVE CARE ENCOUNTER NOTE  History:     Karen Robles is a 39 y.o. [id number] female here for a routine annual gynecologic exam.  Current complaints: None.   Denies abnormal vaginal bleeding, discharge, pelvic pain, problems with intercourse or other gynecologic concerns.    Gynecologic History No LMP recorded. Patient has had an injection. Contraception: Depo-Provera injections Last Pap: 2019. Results were: normal with negative HPV  Obstetric History OB History  Gravida Para Term Preterm AB Living  2 2 2     2   SAB TAB Ectopic Multiple Live Births          2    # Outcome Date GA Lbr Len/2nd Weight Sex Delivery Anes PTL Lv  2 Term 2012 [redacted]w[redacted]d   F    LIV  1 Term 2004   6 lb 4 oz (2.835 kg) F    LIV    Past Medical History:  Diagnosis Date  . Hypertension   . Obesity   . Pulmonary embolism [facility])     Past Surgical History:  Procedure Laterality Date  . MOUTH SURGERY    . WISDOM TOOTH EXTRACTION Bilateral     Current Outpatient Medications on File Prior to Visit  Medication Sig Dispense Refill  . acetaminophen (TYLENOL) 500 MG tablet Take 1,000 mg by mouth every 6 (six) hours as needed for mild pain.    . medroxyPROGESTERone (DEPO-PROVERA) 150 MG/ML injection Inject 1 mL (150 mg total) into the muscle every 3 (three) months. 1 mL 3  . Potassium Chloride ER 20 MEQ TBCR Take 20 mEq by mouth daily. 90 tablet 0  . XARELTO 20 MG TABS tablet TAKE 1 TABLET (20 MG TOTAL) BY MOUTH DAILY WITH SUPPER. 30 tablet 2  . aluminum-magnesium hydroxide-simethicone (MAALOX) 200-200-20 MG/5ML SUSP Take 30 mLs by mouth 4 (four) times daily -  before meals and at bedtime. (Patient not taking: Reported on 01/18/2019) 710 mL 0  . hydrochlorothiazide (HYDRODIURIL) 25 MG tablet Take 1 tablet by mouth once daily 90 tablet 0  . omeprazole (PRILOSEC) 20 MG capsule Take 1 capsule (20 mg total) by mouth daily. (Patient not taking: Reported on 08/18/2019) 30 capsule 0  . potassium  chloride SA (KLOR-CON) 20 MEQ tablet Take 1 tablet by mouth once daily (Patient not taking: Reported on 01/10/2020) 90 tablet 0   Current Facility-Administered Medications on File Prior to Visit  Medication Dose Route Frequency Provider Last Rate Last Admin  . medroxyPROGESTERone (DEPO-PROVERA) injection 150 mg  150 mg Intramuscular Q90 days 03/11/2020, MD   150 mg at 06/22/18 1123  . medroxyPROGESTERone (DEPO-PROVERA) injection 150 mg  150 mg Intramuscular Q90 days 06/24/18, MD   150 mg at 09/07/19 1035    No Known Allergies  Social History:  reports that she has never smoked. She has never used smokeless tobacco. She reports that she does not drink alcohol and does not use drugs.  Family History  Problem Relation Age of Onset  . Hypertension Mother        maternal aunts & uncles  . Hyperlipidemia Mother   . Healthy Brother   . Hypertension Maternal Aunt   . Hypertension Maternal Uncle   . Cancer Maternal Grandmother   . Diabetes Neg Hx   . Heart disease Neg Hx     The following portions of the patient's history were reviewed and updated as appropriate: allergies, current medications, past family history, past medical history, past social history,  past surgical history and problem list.  Review of Systems Pertinent items noted in HPI and remainder of comprehensive ROS otherwise negative.  Physical Exam:  BP (!) 137/91   Pulse (!) 112   Ht 5\' 2"  (1.575 m)   Wt (!) 302 lb (137 kg)   BMI 55.24 kg/m  CONSTITUTIONAL: Well-developed, well-nourished female in no acute distress.  HENT:  Normocephalic, atraumatic, External right and left ear normal. Oropharynx is clear and moist EYES: Conjunctivae and EOM are normal. Pupils are equal, round, and reactive to light. No scleral icterus.  NECK: Normal range of motion, supple, no masses.  Normal thyroid.  SKIN: Skin is warm and dry. No rash noted. Not diaphoretic. No erythema. No pallor. MUSCULOSKELETAL: Normal range of  motion. No tenderness.  No cyanosis, clubbing, or edema.  2+ distal pulses. NEUROLOGIC: Alert and oriented to person, place, and time. Normal reflexes, muscle tone coordination.  PSYCHIATRIC: Normal mood and affect. Normal behavior. Normal judgment and thought content. CARDIOVASCULAR: Normal heart rate noted, regular rhythm RESPIRATORY: Clear to auscultation bilaterally. Effort and breath sounds normal, no problems with respiration noted. BREASTS: Symmetric in size. No masses, tenderness, skin changes, nipple drainage, or lymphadenopathy bilaterally. Performed in the presence of a chaperone. ABDOMEN: Soft, no distention noted.  No tenderness, rebound or guarding.  PELVIC: Normal appearing external genitalia and urethral meatus. Normal uterine size, no other palpable masses, no uterine or adnexal tenderness.  Performed in the presence of a chaperone.   Assessment and Plan:    1. Screening for STD (sexually transmitted disease)  - Cervicovaginal ancillary only( [place]) - Hepatitis B surface antigen - Hepatitis C antibody - HIV Antibody (routine testing w rflx) - RPR - TSH  2. Women's annual routine gynecological examination  - TSH  Will follow up results of pap smear and manage accordingly. Routine preventative health maintenance measures emphasized. Please refer to After Visit Summary for other counseling recommendations.      Dannica Bickham, , NP [facility], [facility]

## 2020-01-15 MED FILL — XARELTO 20 MG TABLET: 20 | 30 days supply | Qty: 30 | Fill #2

## 2020-02-04 ENCOUNTER — Other Ambulatory Visit: Payer: Self-pay | Admitting: Internal Medicine

## 2020-02-04 DIAGNOSIS — I10 Essential (primary) hypertension: Secondary | ICD-10-CM

## 2020-02-04 NOTE — Telephone Encounter (Signed)
Requested Prescriptions  Pending Prescriptions Disp Refills  . hydrochlorothiazide (HYDRODIURIL) 25 MG tablet [Pharmacy Med Name: hydroCHLOROthiazide 25 MG Oral Tablet] 90 tablet 0    Sig: Take 1 tablet by mouth once daily     Cardiovascular: Diuretics - Thiazide Failed - 02/04/2020  9:48 AM      Failed - Last BP in normal range    BP Readings from Last 1 Encounters:  01/10/20 (!) 137/91         Passed - Ca in normal range and within 360 days    Calcium  Date Value Ref Range Status  08/18/2019 9.0 8.7 - 10.2 mg/dL Final         Passed - Cr in normal range and within 360 days    Creat  Date Value Ref Range Status  04/14/2016 1.09 0.50 - 1.10 mg/dL Final   Creatinine, Ser  Date Value Ref Range Status  08/18/2019 0.94 0.57 - 1.00 mg/dL Final         Passed - K in normal range and within 360 days    Potassium  Date Value Ref Range Status  08/18/2019 3.9 3.5 - 5.2 mmol/L Final         Passed - Na in normal range and within 360 days    Sodium  Date Value Ref Range Status  08/18/2019 139 134 - 144 mmol/L Final         Passed - Valid encounter within last 6 months    Recent Outpatient Visits          5 months ago Essential hypertension   [facility] Marcine Matar, MD   12 months ago Screening for diabetes mellitus   [facility] [place], [place], [place]   1 year ago Essential hypertension   [facility] Storm Frisk, MD   2 years ago Hypokalemia   [facility] [place], [place], [place]   2 years ago Severe obesity (BMI >= 40) [facility])   [facility] Marcine Matar, MD

## 2020-02-15 ENCOUNTER — Other Ambulatory Visit: Payer: Self-pay | Admitting: Internal Medicine

## 2020-02-15 DIAGNOSIS — Z86711 Personal history of pulmonary embolism: Secondary | ICD-10-CM

## 2020-02-15 MED FILL — XARELTO 20 MG TABLET: 20 | 30 days supply | Qty: 30 | Fill #0

## 2020-02-22 ENCOUNTER — Telehealth: Payer: Self-pay

## 2020-02-22 ENCOUNTER — Other Ambulatory Visit: Payer: Self-pay

## 2020-02-22 ENCOUNTER — Ambulatory Visit: Payer: Self-pay

## 2020-02-22 DIAGNOSIS — Z3042 Encounter for surveillance of injectable contraceptive: Secondary | ICD-10-CM

## 2020-02-22 MED ORDER — MEDROXYPROGESTERONE ACETATE 150 MG/ML IM SUSP: 150.0000 mg | INTRAMUSCULAR | 4 refills | Status: DC

## 2020-02-22 NOTE — Progress Notes (Signed)
Refill request sent today. Pt recently had Annual Exam . Rx sent per protocol.

## 2020-02-22 NOTE — Telephone Encounter (Signed)
New message    Patient is out of Depo medication  [facility] on [address]    Asking for a call back

## 2020-02-22 NOTE — Telephone Encounter (Signed)
Patient is requesting depo-provera.

## 2020-02-27 ENCOUNTER — Ambulatory Visit (INDEPENDENT_AMBULATORY_CARE_PROVIDER_SITE_OTHER): Payer: Medicaid Other

## 2020-02-27 ENCOUNTER — Other Ambulatory Visit: Payer: Self-pay

## 2020-02-27 VITALS — BP 155/83 | HR 71

## 2020-02-27 DIAGNOSIS — Z3042 Encounter for surveillance of injectable contraceptive: Secondary | ICD-10-CM

## 2020-02-27 MED ORDER — MEDROXYPROGESTERONE ACETATE 150 MG/ML IM SUSP
150.0000 mg | Freq: Once | INTRAMUSCULAR | Status: AC
  Administered 2020-02-27: 150 mg via INTRAMUSCULAR

## 2020-02-27 NOTE — Progress Notes (Signed)
GYN presents to the office for depo, given in RD,tolerated well.  Next Depo: 05/14/2020

## 2020-03-12 ENCOUNTER — Other Ambulatory Visit: Payer: Self-pay | Admitting: Internal Medicine

## 2020-03-12 DIAGNOSIS — Z86711 Personal history of pulmonary embolism: Secondary | ICD-10-CM

## 2020-03-12 MED FILL — XARELTO 20 MG TABLET: 20 | 30 days supply | Qty: 30 | Fill #0

## 2020-03-20 MED FILL — XARELTO 20 MG TABLET: 20 | 30 days supply | Qty: 30 | Fill #0

## 2020-04-16 ENCOUNTER — Other Ambulatory Visit: Payer: Self-pay

## 2020-04-16 ENCOUNTER — Ambulatory Visit: Payer: Self-pay | Attending: Internal Medicine | Admitting: Internal Medicine

## 2020-04-16 ENCOUNTER — Ambulatory Visit (HOSPITAL_BASED_OUTPATIENT_CLINIC_OR_DEPARTMENT_OTHER): Payer: Medicaid Other | Admitting: Pharmacist

## 2020-04-16 ENCOUNTER — Encounter: Payer: Self-pay | Admitting: Internal Medicine

## 2020-04-16 VITALS — BP 110/84 | HR 102 | Temp 98.2°F | Resp 16 | Wt 304.8 lb

## 2020-04-16 DIAGNOSIS — Z79899 Other long term (current) drug therapy: Secondary | ICD-10-CM | POA: Insufficient documentation

## 2020-04-16 DIAGNOSIS — Z7901 Long term (current) use of anticoagulants: Secondary | ICD-10-CM | POA: Insufficient documentation

## 2020-04-16 DIAGNOSIS — I10 Essential (primary) hypertension: Secondary | ICD-10-CM | POA: Insufficient documentation

## 2020-04-16 DIAGNOSIS — Z86711 Personal history of pulmonary embolism: Secondary | ICD-10-CM | POA: Insufficient documentation

## 2020-04-16 DIAGNOSIS — Z9119 Patient's noncompliance with other medical treatment and regimen: Secondary | ICD-10-CM | POA: Insufficient documentation

## 2020-04-16 DIAGNOSIS — Z23 Encounter for immunization: Secondary | ICD-10-CM | POA: Insufficient documentation

## 2020-04-16 DIAGNOSIS — R7303 Prediabetes: Secondary | ICD-10-CM | POA: Insufficient documentation

## 2020-04-16 DIAGNOSIS — Z9229 Personal history of other drug therapy: Secondary | ICD-10-CM | POA: Insufficient documentation

## 2020-04-16 DIAGNOSIS — Z6841 Body Mass Index (BMI) 40.0 and over, adult: Secondary | ICD-10-CM | POA: Insufficient documentation

## 2020-04-16 DIAGNOSIS — Z793 Long term (current) use of hormonal contraceptives: Secondary | ICD-10-CM | POA: Insufficient documentation

## 2020-04-16 DIAGNOSIS — Z713 Dietary counseling and surveillance: Secondary | ICD-10-CM | POA: Insufficient documentation

## 2020-04-16 DIAGNOSIS — Z560 Unemployment, unspecified: Secondary | ICD-10-CM | POA: Insufficient documentation

## 2020-04-16 DIAGNOSIS — Z8616 Personal history of COVID-19: Secondary | ICD-10-CM | POA: Insufficient documentation

## 2020-04-16 MED ORDER — RIVAROXABAN 20 MG PO TABS: ORAL_TABLET | ORAL | 5 refills | Status: DC

## 2020-04-16 MED ORDER — HYDROCHLOROTHIAZIDE 25 MG PO TABS: 25.0000 mg | ORAL_TABLET | Freq: Every day | ORAL | 1 refills | Status: DC

## 2020-04-16 NOTE — Patient Instructions (Addendum)
Healthy Eating Following a healthy eating pattern may help you to achieve and maintain a healthy body weight, reduce the risk of chronic disease, and live a long and productive life. It is important to follow a healthy eating pattern at an appropriate calorie level for your body. Your nutritional needs should be met primarily through food by choosing a variety of nutrient-rich foods. What are tips for following this plan? Reading food labels  Read labels and choose the following: ? Reduced or low sodium. ? Juices with 100% fruit juice. ? Foods with low saturated fats and high polyunsaturated and monounsaturated fats. ? Foods with whole grains, such as whole wheat, cracked wheat, brown rice, and wild rice. ? Whole grains that are fortified with folic acid. This is recommended for women who are pregnant or who want to become pregnant.  Read labels and avoid the following: ? Foods with a lot of added sugars. These include foods that contain brown sugar, corn sweetener, corn syrup, dextrose, fructose, glucose, high-fructose corn syrup, honey, invert sugar, lactose, malt syrup, maltose, molasses, raw sugar, sucrose, trehalose, or turbinado sugar.  Do not eat more than the following amounts of added sugar per day:  6 teaspoons (25 g) for women.  9 teaspoons (38 g) for men. ? Foods that contain processed or refined starches and grains. ? Refined grain products, such as white flour, degermed cornmeal, white bread, and white rice. Shopping  Choose nutrient-rich snacks, such as vegetables, whole fruits, and nuts. Avoid high-calorie and high-sugar snacks, such as potato chips, fruit snacks, and candy.  Use oil-based dressings and spreads on foods instead of solid fats such as butter, stick margarine, or cream cheese.  Limit pre-made sauces, mixes, and "instant" products such as flavored rice, instant noodles, and ready-made pasta.  Try more plant-protein sources, such as tofu, tempeh, black beans,  edamame, lentils, nuts, and seeds.  Explore eating plans such as the Mediterranean diet or vegetarian diet. Cooking  Use oil to saut or stir-fry foods instead of solid fats such as butter, stick margarine, or lard.  Try baking, boiling, grilling, or broiling instead of frying.  Remove the fatty part of meats before cooking.  Steam vegetables in water or broth. Meal planning  At meals, imagine dividing your plate into fourths: ? One-half of your plate is fruits and vegetables. ? One-fourth of your plate is whole grains. ? One-fourth of your plate is protein, especially lean meats, poultry, eggs, tofu, beans, or nuts.  Include low-fat dairy as part of your daily diet.   Lifestyle  Choose healthy options in all settings, including home, work, school, restaurants, or stores.  Prepare your food safely: ? Wash your hands after handling raw meats. ? Keep food preparation surfaces clean by regularly washing with hot, soapy water. ? Keep raw meats separate from ready-to-eat foods, such as fruits and vegetables. ? Cook seafood, meat, poultry, and eggs to the recommended internal temperature. ? Store foods at safe temperatures. In general:  Keep cold foods at 40F (4.4C) or below.  Keep hot foods at 140F (60C) or above.  Keep your freezer at 0F (-17.8C) or below.  Foods are no longer safe to eat when they have been between the temperatures of 40-140F (4.4-60C) for more than 2 hours. What foods should I eat? Fruits Aim to eat 2 cup-equivalents of fresh, canned (in natural juice), or frozen fruits each day. Examples of 1 cup-equivalent of fruit include 1 small apple, 8 large strawberries, 1 cup canned fruit,    cup dried fruit, or 1 cup 100% juice. Vegetables Aim to eat 2-3 cup-equivalents of fresh and frozen vegetables each day, including different varieties and colors. Examples of 1 cup-equivalent of vegetables include 2 medium carrots, 2 cups raw, leafy greens, 1 cup chopped  vegetable (raw or cooked), or 1 medium baked potato. Grains Aim to eat 6 ounce-equivalents of whole grains each day. Examples of 1 ounce-equivalent of grains include 1 slice of bread, 1 cup ready-to-eat cereal, 3 cups popcorn, or  cup cooked rice, pasta, or cereal. Meats and other proteins Aim to eat 5-6 ounce-equivalents of protein each day. Examples of 1 ounce-equivalent of protein include 1 egg, 1/2 cup nuts or seeds, or 1 tablespoon (16 g) peanut butter. A cut of meat or fish that is the size of a deck of cards is about 3-4 ounce-equivalents.  Of the protein you eat each week, try to have at least 8 ounces come from seafood. This includes salmon, trout, herring, and anchovies. Dairy Aim to eat 3 cup-equivalents of fat-free or low-fat dairy each day. Examples of 1 cup-equivalent of dairy include 1 cup (240 mL) milk, 8 ounces (250 g) yogurt, 1 ounces (44 g) natural cheese, or 1 cup (240 mL) fortified soy milk. Fats and oils  Aim for about 5 teaspoons (21 g) per day. Choose monounsaturated fats, such as canola and olive oils, avocados, peanut butter, and most nuts, or polyunsaturated fats, such as sunflower, corn, and soybean oils, walnuts, pine nuts, sesame seeds, sunflower seeds, and flaxseed. Beverages  Aim for six 8-oz glasses of water per day. Limit coffee to three to five 8-oz cups per day.  Limit caffeinated beverages that have added calories, such as soda and energy drinks.  Limit alcohol intake to no more than 1 drink a day for nonpregnant women and 2 drinks a day for men. One drink equals 12 oz of beer (355 mL), 5 oz of wine (148 mL), or 1 oz of hard liquor (44 mL). Seasoning and other foods  Avoid adding excess amounts of salt to your foods. Try flavoring foods with herbs and spices instead of salt.  Avoid adding sugar to foods.  Try using oil-based dressings, sauces, and spreads instead of solid fats. This information is based on general [place] nutrition guidelines. For more  information, visit choosemyplate.gov. Exact amounts may vary based on your nutrition needs. Summary  A healthy eating plan may help you to maintain a healthy weight, reduce the risk of chronic diseases, and stay active throughout your life.  Plan your meals. Make sure you eat the right portions of a variety of nutrient-rich foods.  Try baking, boiling, grilling, or broiling instead of frying.  Choose healthy options in all settings, including home, work, school, restaurants, or stores. This information is not intended to replace advice given to you by your health care provider. Make sure you discuss any questions you have with your health care provider. Document Revised: 07/05/2017 Document Reviewed: 07/05/2017 Elsevier Patient Education  2021 Elsevier Inc.   Influenza Virus Vaccine injection (Fluarix) What is this medicine? INFLUENZA VIRUS VACCINE (in floo EN zuh VAHY ruhs vak SEEN) helps to reduce the risk of getting influenza also known as the flu. This medicine may be used for other purposes; ask your health care provider or pharmacist if you have questions. COMMON BRAND NAME(S): Fluarix, Fluzone What should I tell my health care provider before I take this medicine? They need to know if you have any of these conditions:  bleeding   disorder like hemophilia  fever or infection  Guillain-Barre syndrome or other neurological problems  immune system problems  infection with the human immunodeficiency virus (HIV) or AIDS  low blood platelet counts  multiple sclerosis  an unusual or allergic reaction to influenza virus vaccine, eggs, chicken proteins, latex, gentamicin, other medicines, foods, dyes or preservatives  pregnant or trying to get pregnant  breast-feeding How should I use this medicine? This vaccine is for injection into a muscle. It is given by a health care professional. A copy of Vaccine Information Statements will be given before each vaccination. Read this  sheet carefully each time. The sheet may change frequently. Talk to your pediatrician regarding the use of this medicine in children. Special care may be needed. Overdosage: If you think you have taken too much of this medicine contact a poison control center or emergency room at once. NOTE: This medicine is only for you. Do not share this medicine with others. What if I miss a dose? This does not apply. What may interact with this medicine?  chemotherapy or radiation therapy  medicines that lower your immune system like etanercept, anakinra, infliximab, and adalimumab  medicines that treat or prevent blood clots like warfarin  phenytoin  steroid medicines like prednisone or cortisone  theophylline  vaccines This list may not describe all possible interactions. Give your health care provider a list of all the medicines, herbs, non-prescription drugs, or dietary supplements you use. Also tell them if you smoke, drink alcohol, or use illegal drugs. Some items may interact with your medicine. What should I watch for while using this medicine? Report any side effects that do not go away within 3 days to your doctor or health care professional. Call your health care provider if any unusual symptoms occur within 6 weeks of receiving this vaccine. You may still catch the flu, but the illness is not usually as bad. You cannot get the flu from the vaccine. The vaccine will not protect against colds or other illnesses that may cause fever. The vaccine is needed every year. What side effects may I notice from receiving this medicine? Side effects that you should report to your doctor or health care professional as soon as possible:  allergic reactions like skin rash, itching or hives, swelling of the face, lips, or tongue Side effects that usually do not require medical attention (report to your doctor or health care professional if they continue or are bothersome):  fever  headache  muscle  aches and pains  pain, tenderness, redness, or swelling at site where injected  weak or tired This list may not describe all possible side effects. Call your doctor for medical advice about side effects. You may report side effects to FDA at 1-800-FDA-1088. Where should I keep my medicine? This vaccine is only given in a clinic, pharmacy, doctor's office, or other health care setting and will not be stored at home. NOTE: This sheet is a summary. It may not cover all possible information. If you have questions about this medicine, talk to your doctor, pharmacist, or health care provider.  2021 Elsevier/Gold Standard (2007-10-19 09:30:40)  

## 2020-04-16 NOTE — Progress Notes (Signed)
Patient presents for vaccination against influenza per orders of Dr. Johnson. Consent given. Counseling provided. No contraindications exists. Vaccine administered without incident.   Luke Van Ausdall, PharmD, BCACP, CPP Clinical Pharmacist [facility] [phone]  

## 2020-04-16 NOTE — Progress Notes (Signed)
Patient ID: Karen Robles, female    DOB: 1981-02-08  MRN: [id number]  CC: Hypertension   Subjective: Karen Robles is a 40 y.o. female who presents for chronic disease management.  Her young daughter is with her. 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  Obesity:  Trying to lose wgh. Eating her 3 meals a day. Snack on Debbie cakes.  Sometimes she has snack pack with cheeze, pretzel and apple.  Drinks 3 cans of 16 oz sodas a day Trying to move more. Goes up and down the stairs in her apartment.  Also does a lot of walking at work at [employer].  -she is on Depo shot through her GYN. Pt states she chose Depo rather than other methods.  HTN: Reports compliance with taking HCTZ.  However she admits that she has not been compliant with taking potassium supplement and has not taken it in the past 2 months.  She limits salt in the foods.  Denies chest pains or shortness of breath.  No lower extremity edema.  History of an unprovoked PE.  She remains on Xarelto.  Denies any significant bruising.  No bleeding.  Last CBC done in May of last year revealed mild elevation in platelet count and white blood cell count.  HM; due for flu shot. Completed 3 shots of Pfizer vaccine.  Patient Active Problem List   Diagnosis Date Noted  . Influenza vaccine needed 04/16/2020  . History of pulmonary embolism 07/22/2017  . Surveillance of contraceptive injection 06/11/2016  . Severe obesity (BMI >= 40) (HCC) 07/26/2013  . Essential hypertension 03/07/2013  . Chlamydia infection 02/11/2013     Current Outpatient Medications on File Prior to Visit  Medication Sig Dispense Refill  . acetaminophen (TYLENOL) 500 MG tablet Take 1,000 mg by mouth every 6 (six) hours as needed for mild pain.    Marland Kitchen aluminum-magnesium hydroxide-simethicone (MAALOX) 200-200-20 MG/5ML SUSP Take 30 mLs by mouth 4 (four) times daily -  before meals and at  bedtime. (Patient not taking: Reported on 01/18/2019) 710 mL 0  . hydrochlorothiazide (HYDRODIURIL) 25 MG tablet Take 1 tablet by mouth once daily 90 tablet 0  . omeprazole (PRILOSEC) 20 MG capsule Take 1 capsule (20 mg total) by mouth daily. (Patient not taking: Reported on 08/18/2019) 30 capsule 0  . Potassium Chloride ER 20 MEQ TBCR Take 20 mEq by mouth daily. 90 tablet 0  . potassium chloride SA (KLOR-CON) 20 MEQ tablet Take 1 tablet by mouth once daily (Patient not taking: Reported on 01/10/2020) 90 tablet 0  . XARELTO 20 MG TABS tablet TAKE 1 TABLET (20 MG TOTAL) BY MOUTH DAILY WITH SUPPER. 30 tablet 0   Current Facility-Administered Medications on File Prior to Visit  Medication Dose Route Frequency Provider Last Rate Last Admin  . medroxyPROGESTERone (DEPO-PROVERA) injection 150 mg  150 mg Intramuscular Q90 days Brock Bad, MD   150 mg at 06/22/18 1123  . medroxyPROGESTERone (DEPO-PROVERA) injection 150 mg  150 mg Intramuscular Q90 days Hermina Staggers, MD   150 mg at 09/07/19 1035    No Known Allergies  Social History   Socioeconomic History  . Marital status: Single    Spouse name: Not on file  . Number of children: 2  . Years of education: Not on file  . Highest education level: Not on file  Occupational History  . Occupation: Unemployed  Tobacco Use  .  Smoking status: Never Smoker  . Smokeless tobacco: Never Used  Vaping Use  . Vaping Use: Never used  Substance and Sexual Activity  . Alcohol use: No    Alcohol/week: 0.0 standard drinks  . Drug use: No  . Sexual activity: Yes    Partners: Male    Birth control/protection: Injection  Other Topics Concern  . Not on file  Social History Narrative  . Not on file   Social Determinants of Health   Financial Resource Strain: Not on file  Food Insecurity: Not on file  Transportation Needs: Not on file  Physical Activity: Not on file  Stress: Not on file  Social Connections: Not on file  Intimate Partner  Violence: Not on file    Family History  Problem Relation Age of Onset  . Hypertension Mother        maternal aunts & uncles  . Hyperlipidemia Mother   . Healthy Brother   . Hypertension Maternal Aunt   . Hypertension Maternal Uncle   . Cancer Maternal Grandmother   . Diabetes Neg Hx   . Heart disease Neg Hx     Past Surgical History:  Procedure Laterality Date  . MOUTH SURGERY    . WISDOM TOOTH EXTRACTION Bilateral     ROS: Review of Systems Negative except as stated above  PHYSICAL EXAM: BP 130/86   Pulse (!) 102   Temp 98.2 F (36.8 C)   Resp 16   Wt (!) 304 lb 12.8 oz (138.3 kg)   SpO2 100%   BMI 55.75 kg/m   Wt Readings from Last 3 Encounters:  04/16/20 (!) 304 lb 12.8 oz (138.3 kg)  01/10/20 (!) 302 lb (137 kg)  11/30/19 (!) 306 lb (138.8 kg)    Physical Exam  General appearance - alert, well appearing, morbidly obese young female and in no distress Mental status - normal mood, behavior, speech, dress, motor activity, and thought processes Neck - supple, no significant adenopathy Chest - clear to auscultation, no wheezes, rales or rhonchi, symmetric air entry Heart - normal rate, regular rhythm, normal S1, S2, no murmurs, rubs, clicks or gallops Extremities - peripheral pulses normal, no pedal edema, no clubbing or cyanosis   CMP Latest Ref Rng & Units 08/18/2019 02/09/2019 12/30/2017  Glucose 65 - 99 mg/dL 74 86 67(E)  BUN 6 - 20 mg/dL 14 17 12   Creatinine 0.57 - 1.00 mg/dL 7.20) 9.47(S  Sodium 134 - 144 mmol/L 139 143 143  Potassium 3.5 - 5.2 mmol/L 3.9 3.9 3.4(L)  Chloride 96 - 106 mmol/L 102 106 104  CO2 20 - 29 mmol/L 24 24 22   Calcium 8.7 - 10.2 mg/dL 9.0 8.8 9.62)  Total Protein 6.0 - 8.5 g/dL 7.6 - -  Total Bilirubin 0.0 - 1.2 mg/dL - -  Alkaline Phos 39 - 117 IU/L 94 - -  AST 0 - 40 IU/L 21 - -  ALT 0 - 32 IU/L 22 - -   Lipid Panel     Component Value Date/Time   CHOL 154 08/18/2019 1604   TRIG 142 08/18/2019 1604   HDL 38  (L) 08/18/2019 1604   CHOLHDL 4.1 08/18/2019 1604   CHOLHDL 3.9 09/05/2013 1148   VLDL 24 09/05/2013 1148   LDLCALC 91 08/18/2019 1604    CBC    Component Value Date/Time   WBC 13.6 (H) 08/18/2019 1604   WBC 9.3 04/14/2016 0918   RBC 4.79 08/18/2019 1604   RBC 4.82 04/14/2016 0918  HGB 13.3 08/18/2019 1604   HCT 41.1 08/18/2019 1604   PLT 464 (H) 08/18/2019 1604   MCV 86 08/18/2019 1604   MCH 27.8 08/18/2019 1604   MCH 26.6 (L) 04/14/2016 0918   MCHC 32.4 08/18/2019 1604   MCHC 32.7 04/14/2016 0918   RDW 13.7 08/18/2019 1604   LYMPHSABS 3,255 04/14/2016 0918   MONOABS 279 04/14/2016 0918   EOSABS 93 04/14/2016 0918   BASOSABS 93 04/14/2016 0918    ASSESSMENT AND PLAN:  1. Essential hypertension Close to goal.  She will continue hydrochlorothiazide and low-salt diet.  We will check chemistry today.  If potassium level is low we will contact her to restart potassium. - hydrochlorothiazide (HYDRODIURIL) 25 MG tablet; Take 1 tablet (25 mg total) by mouth daily.  Dispense: 90 tablet; Refill: 1  2. History of pulmonary embolism - rivaroxaban (XARELTO) 20 MG TABS tablet; TAKE 1 TABLET (20 MG TOTAL) BY MOUTH DAILY WITH SUPPER.  Dispense: 30 tablet; Refill: 5  3. Morbid obesity with BMI of 50.0-59.9, adult (HCC) Dietary counseling given.  Recommend that she eliminate sugary drinks from her diet.  We calculated that based on just drinking three 16 ounce sodas daily, she is consuming about 460 cal just through drinking the sodas.  Advise healthier snacks like fruits or nuts.  Encouraged her to try to get in at least 30 minutes of brisk walking 4 times a week. Printed information given. Told her that she may want to consider other forms of birth control like Nexplanon or IUD rather than Depo which can contribute to weight gain.  She is not interested in any of those methods.  4. Influenza vaccine needed Given.  5. On continuous oral anticoagulation Continue Xarelto. - Basic  metabolic panel - CBC With Differential  6. COVID-19 vaccine series completed. She reports having had 3 shots already.  She will bring her card on next visit for [place] to update it in the system.  Patient was given the opportunity to ask questions.  Patient verbalized understanding of the plan and was able to repeat key elements of the plan.   No orders of the defined types were placed in this encounter.    Requested Prescriptions    No prescriptions requested or ordered in this encounter    No follow-ups on file.  [name], MD, FACP

## 2020-04-17 LAB — CBC WITH DIFFERENTIAL
Basophils Absolute: 0.1 10*3/uL (ref 0.0–0.2)
Basos: 1 %
EOS (ABSOLUTE): 0.3 10*3/uL (ref 0.0–0.4)
Eos: 2 %
Hematocrit: 40.8 % (ref 34.0–46.6)
Hemoglobin: 13.1 g/dL (ref 11.1–15.9)
Immature Grans (Abs): 0 10*3/uL (ref 0.0–0.1)
Immature Granulocytes: 0 %
Lymphocytes Absolute: 4.8 10*3/uL — ABNORMAL HIGH (ref 0.7–3.1)
Lymphs: 39 %
MCH: 26.4 pg — ABNORMAL LOW (ref 26.6–33.0)
MCHC: 32.1 g/dL (ref 31.5–35.7)
MCV: 82 fL (ref 79–97)
Monocytes Absolute: 0.6 10*3/uL (ref 0.1–0.9)
Monocytes: 5 %
Neutrophils Absolute: 6.6 10*3/uL (ref 1.4–7.0)
Neutrophils: 53 %
RBC: 4.96 x10E6/uL (ref 3.77–5.28)
RDW: 13.3 % (ref 11.7–15.4)
WBC: 12.4 10*3/uL — ABNORMAL HIGH (ref 3.4–10.8)

## 2020-04-17 LAB — BASIC METABOLIC PANEL
BUN/Creatinine Ratio: 17 (ref 9–23)
BUN: 18 mg/dL (ref 6–20)
CO2: 27 mmol/L (ref 20–29)
Calcium: 9 mg/dL (ref 8.7–10.2)
Chloride: 103 mmol/L (ref 96–106)
Creatinine, Ser: 1.04 mg/dL — ABNORMAL HIGH (ref 0.57–1.00)
GFR calc Af Amer: 79 mL/min/{1.73_m2} (ref >59)
GFR calc non Af Amer: 69 mL/min/{1.73_m2} (ref >59)
Glucose: 78 mg/dL (ref 65–99)
Potassium: 3.8 mmol/L (ref 3.5–5.2)
Sodium: 146 mmol/L — ABNORMAL HIGH (ref 134–144)

## 2020-04-17 NOTE — Addendum Note (Signed)
Addended by: [name] B on: 04/17/2020 10:21 AM   Modules accepted: Orders

## 2020-04-18 ENCOUNTER — Other Ambulatory Visit: Payer: Self-pay | Admitting: Internal Medicine

## 2020-04-18 DIAGNOSIS — Z86711 Personal history of pulmonary embolism: Secondary | ICD-10-CM

## 2020-04-18 MED FILL — XARELTO 20 MG TABLET: 20 | 30 days supply | Qty: 30 | Fill #0

## 2020-04-20 LAB — SPECIMEN STATUS REPORT

## 2020-04-20 LAB — PLATELET COUNT: Platelets: 471 10*3/uL — ABNORMAL HIGH (ref 150–450)

## 2020-04-21 ENCOUNTER — Encounter: Payer: Self-pay | Admitting: Internal Medicine

## 2020-05-20 MED FILL — XARELTO 20 MG TABLET: 20 | 30 days supply | Qty: 30 | Fill #1

## 2020-05-21 ENCOUNTER — Ambulatory Visit: Payer: Medicaid Other

## 2020-05-27 ENCOUNTER — Ambulatory Visit: Payer: Medicaid Other

## 2020-05-28 ENCOUNTER — Other Ambulatory Visit: Payer: Self-pay

## 2020-05-28 ENCOUNTER — Ambulatory Visit: Payer: Medicaid Other

## 2020-05-28 VITALS — BP 133/89 | HR 106 | Wt 310.9 lb

## 2020-05-28 DIAGNOSIS — Z3042 Encounter for surveillance of injectable contraceptive: Secondary | ICD-10-CM

## 2020-05-28 MED ORDER — MEDROXYPROGESTERONE ACETATE 150 MG/ML IM SUSP
150.0000 mg | Freq: Once | INTRAMUSCULAR | Status: AC
  Administered 2020-05-28: 150 mg via INTRAMUSCULAR

## 2020-05-28 NOTE — Progress Notes (Signed)
[name] here for Depo-Provera  Injection.  Injection administered without complication. Patient will return in 3 months for next injection.  Ralene Bathe, RN 05/28/2020  4:04 PM

## 2020-07-17 ENCOUNTER — Other Ambulatory Visit: Payer: Self-pay

## 2020-07-17 MED FILL — Rivaroxaban Tab 20 MG: ORAL | 30 days supply | Qty: 30 | Fill #0 | Status: AC

## 2020-07-23 ENCOUNTER — Other Ambulatory Visit: Payer: Self-pay

## 2020-08-13 ENCOUNTER — Other Ambulatory Visit: Payer: Self-pay

## 2020-08-13 ENCOUNTER — Ambulatory Visit (INDEPENDENT_AMBULATORY_CARE_PROVIDER_SITE_OTHER): Payer: Medicaid Other

## 2020-08-13 DIAGNOSIS — Z3042 Encounter for surveillance of injectable contraceptive: Secondary | ICD-10-CM | POA: Diagnosis not present

## 2020-08-13 MED ORDER — MEDROXYPROGESTERONE ACETATE 150 MG/ML IM SUSP
150.0000 mg | Freq: Once | INTRAMUSCULAR | Status: AC
  Administered 2020-08-13: 150 mg via INTRAMUSCULAR

## 2020-08-13 NOTE — Progress Notes (Signed)
Patient presents for depo injection. Patient is within her window. Inj given in left deltoid. Patient tolerated well. Next Depo due 7/26-8/9.

## 2020-08-15 ENCOUNTER — Ambulatory Visit: Payer: Self-pay | Attending: Internal Medicine | Admitting: Internal Medicine

## 2020-08-15 ENCOUNTER — Other Ambulatory Visit: Payer: Self-pay

## 2020-08-15 DIAGNOSIS — Z7901 Long term (current) use of anticoagulants: Secondary | ICD-10-CM

## 2020-08-15 DIAGNOSIS — Z6841 Body Mass Index (BMI) 40.0 and over, adult: Secondary | ICD-10-CM

## 2020-08-15 DIAGNOSIS — I10 Essential (primary) hypertension: Secondary | ICD-10-CM

## 2020-08-15 MED ORDER — HYDROCHLOROTHIAZIDE 25 MG PO TABS: 25.0000 mg | ORAL_TABLET | Freq: Every day | ORAL | 1 refills | Status: DC

## 2020-08-15 NOTE — Progress Notes (Signed)
Virtual Visit via Telephone Note  I connected with Karen Robles on 08/15/2020 at 1:30 p.m by telephone and verified that I am speaking with the correct person using two identifiers  Location: Patient:  Karen Robles Provider: office  Participants: Myself Patient CMA: [name] interpreter:   I discussed the limitations, risks, security and privacy concerns of performing an evaluation and management service by telephone and the availability of in person appointments. I also discussed with the patient that there may be a patient responsible charge related to this service. The patient expressed understanding and agreed to proceed.   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  HTN: compliant with HCTZ and salt restriction.  No taking Potassium supplement because pills too large Has a device but does not check BP No CP/SOB/HA/dizziness Doing better with eating habits. Eats light lunch and dinner.  Exercises a little.  She works for [employer] and they now offer a plan where she can join a gym at no cost to herself.  She plans to do that.  She is still on Xarelto.  Denies any spontaneous bruising or bleeding on the medication.  She has submitted her forms for renewal with our pharmacy and waiting to hear back. Outpatient Encounter Medications as of 08/15/2020  Medication Sig  . acetaminophen (TYLENOL) 500 MG tablet Take 1,000 mg by mouth every 6 (six) hours as needed for mild pain.  . hydrochlorothiazide (HYDRODIURIL) 25 MG tablet Take 1 tablet (25 mg total) by mouth daily.  . potassium chloride SA (KLOR-CON) 20 MEQ tablet Take 1 tablet by mouth once daily (Patient not taking: No sig reported)  . rivaroxaban (XARELTO) 20 MG TABS tablet TAKE 1 TABLET (20 MG TOTAL) BY MOUTH DAILY WITH SUPPER.   Facility-Administered Encounter Medications as of 08/15/2020  Medication Note  . medroxyPROGESTERone  (DEPO-PROVERA) injection 150 mg 09/27/2018: Last admin 09/17/2018  . medroxyPROGESTERone (DEPO-PROVERA) injection 150 mg       Observations/Objective: Results for orders placed or performed in visit on 04/16/20  Basic metabolic panel  Result Value Ref Range   Glucose 78 65 - 99 mg/dL   BUN 18 6 - 20 mg/dL   Creatinine, Ser 6.01 (H) 0.57 - 1.00 mg/dL   GFR calc non Af Amer 69 >59 mL/min/1.73   GFR calc Af Amer 79 >59 mL/min/1.73   BUN/Creatinine Ratio 17 9 - 23   Sodium 146 (H) 134 - 144 mmol/L   Potassium 3.8 3.5 - 5.2 mmol/L   Chloride 103 96 - 106 mmol/L   CO2 27 20 - 29 mmol/L   Calcium 9.0 8.7 - 10.2 mg/dL  CBC With Differential  Result Value Ref Range   WBC 12.4 (H) 3.4 - 10.8 x10E3/uL   RBC 4.96 3.77 - 5.28 x10E6/uL   Hemoglobin 13.1 11.1 - 15.9 g/dL   Hematocrit 09.3 23.5 - 46.6 %   MCV 82 79 - 97 fL   MCH 26.4 (L) 26.6 - 33.0 pg   MCHC 32.1 31.5 - 35.7 g/dL   RDW 57.3 22.0 - 25.4 %   Neutrophils 53 Not Estab. %   Lymphs 39 Not Estab. %   Monocytes 5 Not Estab. %   Eos 2 Not Estab. %   Basos 1 Not Estab. %   Neutrophils Absolute 6.6 1.4 - 7.0 x10E3/uL   Lymphocytes Absolute 4.8 (H) 0.7 - 3.1 x10E3/uL   Monocytes Absolute 0.6 0.1 - 0.9  x10E3/uL   EOS (ABSOLUTE) 0.3 0.0 - 0.4 x10E3/uL   Basophils Absolute 0.1 0.0 - 0.2 x10E3/uL   Immature Granulocytes 0 Not Estab. %   Immature Grans (Abs) 0.0 0.0 - 0.1 x10E3/uL  Platelet count  Result Value Ref Range   Platelets 471 (H) 150 - 450 x10E3/uL  Specimen status report  Result Value Ref Range   specimen status report Comment      Assessment and Plan: 1. Essential hypertension Continue hydrochlorothiazide and low-salt diet. Advised to come to the lab within the next few days to have a potassium level checked.  If still low we will use liquid potassium supplement. - hydrochlorothiazide (HYDRODIURIL) 25 MG tablet; Take 1 tablet (25 mg total) by mouth daily.  Dispense: 90 tablet; Refill: 1 - Potassium; Future  2. On  continuous oral anticoagulation Stable on Xarelto.  3. Morbid obesity with BMI of 50.0-59.9, adult [facility]) Encouraged her to take advantage of the gym membership that she can get through her employer with goal of getting in about 150 minutes/week total of moderate intensity exercise.  Encourage healthy eating habits.   Follow Up Instructions: 4 mths   I discussed the assessment and treatment plan with the patient. The patient was provided an opportunity to ask questions and all were answered. The patient agreed with the plan and demonstrated an understanding of the instructions.   The patient was advised to call back or seek an in-person evaluation if the symptoms worsen or if the condition fails to improve as anticipated.  I  Spent 8 minutes on this telephone encounter  [name], MD

## 2020-08-20 ENCOUNTER — Other Ambulatory Visit: Payer: Self-pay

## 2020-08-20 MED FILL — Rivaroxaban Tab 20 MG: ORAL | 30 days supply | Qty: 30 | Fill #1 | Status: AC

## 2020-08-26 ENCOUNTER — Other Ambulatory Visit: Payer: Self-pay

## 2020-08-27 ENCOUNTER — Ambulatory Visit: Payer: Self-pay | Attending: Family Medicine

## 2020-08-27 ENCOUNTER — Other Ambulatory Visit: Payer: Self-pay

## 2020-08-27 DIAGNOSIS — I10 Essential (primary) hypertension: Secondary | ICD-10-CM

## 2020-08-28 ENCOUNTER — Other Ambulatory Visit: Payer: Self-pay

## 2020-08-28 ENCOUNTER — Telehealth: Payer: Self-pay

## 2020-08-28 ENCOUNTER — Other Ambulatory Visit: Payer: Self-pay | Admitting: Internal Medicine

## 2020-08-28 LAB — POTASSIUM: Potassium: 3.1 mmol/L — ABNORMAL LOW (ref 3.5–5.2)

## 2020-08-28 MED ORDER — POTASSIUM CHLORIDE 40 MEQ/15ML (20%) PO SOLN: 7.5000 mL | Freq: Every day | ORAL | 6 refills | Status: DC

## 2020-08-28 MED FILL — Rivaroxaban Tab 20 MG: ORAL | 30 days supply | Qty: 30 | Fill #2 | Status: CN

## 2020-08-28 NOTE — Telephone Encounter (Signed)
Contacted pt to go over lab results pt didn't answer lvm  

## 2020-09-16 ENCOUNTER — Other Ambulatory Visit (HOSPITAL_COMMUNITY): Payer: Self-pay

## 2020-09-23 MED FILL — Rivaroxaban Tab 20 MG: ORAL | 30 days supply | Qty: 30 | Fill #2 | Status: CN

## 2020-09-24 ENCOUNTER — Other Ambulatory Visit: Payer: Self-pay

## 2020-10-01 ENCOUNTER — Other Ambulatory Visit: Payer: Self-pay

## 2020-10-02 ENCOUNTER — Other Ambulatory Visit: Payer: Self-pay

## 2020-10-02 MED FILL — Rivaroxaban Tab 20 MG: ORAL | 30 days supply | Qty: 30 | Fill #2 | Status: AC

## 2020-10-30 ENCOUNTER — Other Ambulatory Visit: Payer: Self-pay | Admitting: Internal Medicine

## 2020-10-30 DIAGNOSIS — Z86711 Personal history of pulmonary embolism: Secondary | ICD-10-CM

## 2020-10-30 NOTE — Telephone Encounter (Signed)
Requested medications are due for refill today.  yes  Requested medications are on the active medications list.  yes  Last refill. 04/18/2020  Future visit scheduled.   yes  Notes to clinic.  Failed protocol d/t lab work.

## 2020-10-31 ENCOUNTER — Other Ambulatory Visit: Payer: Self-pay

## 2020-10-31 MED ORDER — RIVAROXABAN 20 MG PO TABS
ORAL_TABLET | Freq: Every day | ORAL | 5 refills | Status: DC
  Filled 2020-10-31: qty 30, 30d supply, fill #0
  Filled 2020-11-28: qty 30, 30d supply, fill #1
  Filled 2020-12-31: qty 30, 30d supply, fill #2
  Filled 2021-01-30: qty 30, 30d supply, fill #3
  Filled 2021-03-04: qty 30, 30d supply, fill #4
  Filled 2021-04-08: qty 30, 30d supply, fill #5

## 2020-11-04 ENCOUNTER — Other Ambulatory Visit: Payer: Self-pay

## 2020-11-06 ENCOUNTER — Ambulatory Visit: Payer: Medicaid Other

## 2020-11-12 ENCOUNTER — Ambulatory Visit (INDEPENDENT_AMBULATORY_CARE_PROVIDER_SITE_OTHER): Payer: Medicaid Other

## 2020-11-12 ENCOUNTER — Other Ambulatory Visit: Payer: Self-pay

## 2020-11-12 VITALS — BP 121/83 | HR 97 | Ht 63.0 in | Wt 307.0 lb

## 2020-11-12 DIAGNOSIS — Z3042 Encounter for surveillance of injectable contraceptive: Secondary | ICD-10-CM | POA: Diagnosis not present

## 2020-11-12 MED ORDER — MEDROXYPROGESTERONE ACETATE 150 MG/ML IM SUSP
150.0000 mg | Freq: Once | INTRAMUSCULAR | Status: AC
  Administered 2020-11-12: 150 mg via INTRAMUSCULAR

## 2020-11-12 NOTE — Progress Notes (Signed)
Depo Provera 150mg  given IM Right Deltoid. Pt tolerated well, no adverse side effects noted. Pt to return to clinic between 01/28/21-02/11/21 for repeat injection. Yearly exam due in 01/2021. Pt to schedule with next injection or to call back.

## 2020-11-29 ENCOUNTER — Other Ambulatory Visit: Payer: Self-pay

## 2020-12-02 ENCOUNTER — Other Ambulatory Visit: Payer: Self-pay

## 2020-12-16 ENCOUNTER — Other Ambulatory Visit: Payer: Self-pay

## 2020-12-16 ENCOUNTER — Ambulatory Visit: Payer: Self-pay | Attending: Internal Medicine | Admitting: Internal Medicine

## 2020-12-16 ENCOUNTER — Encounter: Payer: Self-pay | Admitting: Internal Medicine

## 2020-12-16 DIAGNOSIS — Z23 Encounter for immunization: Secondary | ICD-10-CM

## 2020-12-16 DIAGNOSIS — I10 Essential (primary) hypertension: Secondary | ICD-10-CM

## 2020-12-16 DIAGNOSIS — Z6841 Body Mass Index (BMI) 40.0 and over, adult: Secondary | ICD-10-CM

## 2020-12-16 DIAGNOSIS — Z86711 Personal history of pulmonary embolism: Secondary | ICD-10-CM

## 2020-12-16 MED ORDER — POTASSIUM CHLORIDE CRYS ER 20 MEQ PO TBCR: 20.0000 meq | EXTENDED_RELEASE_TABLET | Freq: Every day | ORAL | 2 refills | Status: DC

## 2020-12-16 NOTE — Progress Notes (Signed)
Patient ID: Karen Robles, female   DOB: 13-Nov-1980, 40 y.o.   MRN: [id number] Virtual Visit via Telephone Note  I connected with Karen Robles on 12/16/2020 at 12:02 PM by telephone and verified that I am speaking with the correct person using two identifiers  Location: Patient: home Provider: office  Participants: Myself Patient   I discussed the limitations, risks, security and privacy concerns of performing an evaluation and management service by telephone and the availability of in person appointments. I also discussed with the patient that there may be a patient responsible charge related to this service. The patient expressed understanding and agreed to proceed.   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  Last eval 08/2020.  This is for chronic ds management.  HYPERTENSION Currently taking: see medication list Med Adherence: [x]  Yes on HCTZ and Potassium supplement.  K+ level low when last checked. Pt states she started taking the potassium supplement consistently since then Medication side effects: []  Yes    [x]  No Adherence with salt restriction: [x]  Yes    []  No Home Monitoring?: []  Yes    [x]  No - her device not working Monitoring Frequency:  Home BP results range:  SOB? []  Yes    [x]  No Chest Pain?: []  Yes    [x]  No Leg swelling?: []  Yes    [x]  No Headaches?: []  Yes    [x]  No Dizziness? []  Yes    [x]  No Comments:   Hx of PE:  no significant bruising or bleeding  Obesity:  wonders where she can take apple cider vinegar gummy to help lose wgh.  Goes up and down 1 flight stairs once a day and does some exercises sitting down.  Reports she is doing good with eating habits.     Outpatient Encounter Medications as of 12/16/2020  Medication Sig   rivaroxaban (XARELTO) 20 MG TABS tablet TAKE 1 TABLET (20 MG TOTAL) BY MOUTH DAILY WITH SUPPER.   hydrochlorothiazide (HYDRODIURIL) 25 MG tablet  Take 1 tablet (25 mg total) by mouth daily.   medroxyPROGESTERone Acetate 150 MG/ML SUSY Inject 1 mL into the muscle every 3 (three) months.   potassium chloride SA (KLOR-CON) 20 MEQ tablet Take 1 tablet by mouth daily.   No facility-administered encounter medications on file as of 12/16/2020.      Observations/Objective: Results for orders placed or performed in visit on 08/27/20  Potassium  Result Value Ref Range   Potassium 3.1 (L) 3.5 - 5.2 mmol/L     Assessment and Plan: 1. Essential hypertension Continue HCTZ and potassium supplement.  She will come to the lab later this week for blood test. - potassium chloride SA (KLOR-CON) 20 MEQ tablet; Take 1 tablet (20 mEq total) by mouth daily.  Dispense: 90 tablet; Refill: 2 - CBC; Future - Comprehensive metabolic panel; Future - Lipid panel; Future  2. Morbid obesity with BMI of 50.0-59.9, adult (HCC) Encouraged her to continue healthy eating habits.  I would avoid the vinegar supplement.  Challenged her to increase her exercise with a goal of getting in at least 30 minutes of moderate intensity exercise 3 to 5 days a week. - Hemoglobin A1c; Future  3. History of pulmonary embolism Tolerating Xarelto.  4. Influenza vaccine needed Patient would like to get the flu shot when she comes for lab tests later this week.  We will schedule her with the clinical pharmacist.  Follow Up Instructions: 4 mths   I discussed the assessment and treatment plan with the patient. The patient was provided an opportunity to ask questions and all were answered. The patient agreed with the plan and demonstrated an understanding of the instructions.   The patient was advised to call back or seek an in-person evaluation if the symptoms worsen or if the condition fails to improve as anticipated.  I  Spent 10 minutes on this telephone encounter  [name], MD

## 2020-12-19 NOTE — Addendum Note (Signed)
Addended byMemory Dance on: 12/19/2020 11:25 AM   Modules accepted: Orders

## 2020-12-20 LAB — COMPREHENSIVE METABOLIC PANEL
ALT: 15 IU/L (ref 0–32)
AST: 16 IU/L (ref 0–40)
Albumin/Globulin Ratio: 1 — ABNORMAL LOW (ref 1.2–2.2)
Albumin: 3.5 g/dL — ABNORMAL LOW (ref 3.8–4.8)
Alkaline Phosphatase: 81 IU/L (ref 44–121)
BUN/Creatinine Ratio: 19 (ref 9–23)
BUN: 19 mg/dL (ref 6–20)
Bilirubin Total: 0.3 mg/dL (ref 0.0–1.2)
CO2: 25 mmol/L (ref 20–29)
Calcium: 9 mg/dL (ref 8.7–10.2)
Chloride: 104 mmol/L (ref 96–106)
Creatinine, Ser: 1 mg/dL (ref 0.57–1.00)
Globulin, Total: 3.6 g/dL (ref 1.5–4.5)
Glucose: 120 mg/dL — ABNORMAL HIGH (ref 65–99)
Potassium: 3.4 mmol/L — ABNORMAL LOW (ref 3.5–5.2)
Sodium: 144 mmol/L (ref 134–144)
Total Protein: 7.1 g/dL (ref 6.0–8.5)
eGFR: 74 mL/min/{1.73_m2} (ref >59)

## 2020-12-20 LAB — CBC
Hematocrit: 39.7 % (ref 34.0–46.6)
Hemoglobin: 12.9 g/dL (ref 11.1–15.9)
MCH: 26 pg — ABNORMAL LOW (ref 26.6–33.0)
MCHC: 32.5 g/dL (ref 31.5–35.7)
MCV: 80 fL (ref 79–97)
Platelets: 474 10*3/uL — ABNORMAL HIGH (ref 150–450)
RBC: 4.97 x10E6/uL (ref 3.77–5.28)
RDW: 13.2 % (ref 11.7–15.4)
WBC: 11.5 10*3/uL — ABNORMAL HIGH (ref 3.4–10.8)

## 2020-12-20 LAB — LIPID PANEL
Chol/HDL Ratio: 4.2 ratio (ref 0.0–4.4)
Cholesterol, Total: 146 mg/dL (ref 100–199)
HDL: 35 mg/dL — ABNORMAL LOW (ref >39)
LDL Chol Calc (NIH): 95 mg/dL (ref 0–99)
Triglycerides: 84 mg/dL (ref 0–149)
VLDL Cholesterol Cal: 16 mg/dL (ref 5–40)

## 2020-12-20 LAB — HEMOGLOBIN A1C
Est. average glucose Bld gHb Est-mCnc: 123 mg/dL
Hgb A1c MFr Bld: 5.9 % — ABNORMAL HIGH (ref 4.8–5.6)

## 2021-01-01 ENCOUNTER — Other Ambulatory Visit: Payer: Self-pay

## 2021-01-03 ENCOUNTER — Other Ambulatory Visit: Payer: Self-pay

## 2021-01-30 ENCOUNTER — Other Ambulatory Visit: Payer: Self-pay

## 2021-02-03 ENCOUNTER — Ambulatory Visit (INDEPENDENT_AMBULATORY_CARE_PROVIDER_SITE_OTHER): Payer: Medicaid Other

## 2021-02-03 ENCOUNTER — Other Ambulatory Visit: Payer: Self-pay

## 2021-02-03 DIAGNOSIS — Z3042 Encounter for surveillance of injectable contraceptive: Secondary | ICD-10-CM | POA: Diagnosis not present

## 2021-02-03 MED ORDER — MEDROXYPROGESTERONE ACETATE 150 MG/ML IM SUSP
150.0000 mg | Freq: Once | INTRAMUSCULAR | Status: AC
  Administered 2021-02-03: 150 mg via INTRAMUSCULAR

## 2021-02-03 NOTE — Progress Notes (Signed)
Patient was assessed and managed by nursing staff during this encounter. I have reviewed the chart and agree with the documentation and plan. I have also made any necessary editorial changes.  Catalina Antigua, MD 02/03/2021 11:38 AM

## 2021-02-03 NOTE — Progress Notes (Signed)
Patient presents for depo injection. Patient is within her window. Inj given in LD. Patient tolerated well. Next depo due 1/16-1/30.

## 2021-03-05 ENCOUNTER — Other Ambulatory Visit: Payer: Self-pay

## 2021-03-10 ENCOUNTER — Other Ambulatory Visit: Payer: Self-pay

## 2021-03-10 ENCOUNTER — Other Ambulatory Visit (HOSPITAL_COMMUNITY)
Admission: RE | Admit: 2021-03-10 | Discharge: 2021-03-10 | Disposition: A | Discharge status: Home / Self Care | Payer: Medicaid Other | Source: Ambulatory Visit | Attending: Advanced Practice Midwife | Admitting: Advanced Practice Midwife

## 2021-03-10 ENCOUNTER — Ambulatory Visit (INDEPENDENT_AMBULATORY_CARE_PROVIDER_SITE_OTHER): Payer: Medicaid Other | Admitting: Advanced Practice Midwife

## 2021-03-10 ENCOUNTER — Encounter: Payer: Self-pay | Admitting: Advanced Practice Midwife

## 2021-03-10 VITALS — BP 139/90 | HR 92 | Ht 63.0 in | Wt 314.0 lb

## 2021-03-10 DIAGNOSIS — Z01419 Encounter for gynecological examination (general) (routine) without abnormal findings: Secondary | ICD-10-CM

## 2021-03-10 DIAGNOSIS — Z3009 Encounter for other general counseling and advice on contraception: Secondary | ICD-10-CM

## 2021-03-10 MED ORDER — MEDROXYPROGESTERONE ACETATE 150 MG/ML IM SUSY
1.0000 mL | PREFILLED_SYRINGE | INTRAMUSCULAR | 3 refills | Status: DC
Start: 2021-03-10 — End: 2022-06-03

## 2021-03-10 NOTE — Progress Notes (Signed)
   Subjective:     Karen Robles is a 40 y.o. female here at [facility] for a routine exam.  Current complaints: none, no menses on Depo.  Personal health questionnaire reviewed: yes.  Do you have a primary care provider? yes Do you feel safe at home? yes  Flowsheet Row Office Visit from 04/16/2020 in [facility]  PHQ-2 Total Score 0       Health Maintenance Due  Topic Date Due   COVID-19 Vaccine (1) Never done   PAP SMEAR-Modifier  12/28/2020     Risk factors for chronic health problems: Smoking: Never Alchohol/how much: None Pt BMI: Body mass index is 55.62 kg/m.   Gynecologic History No LMP recorded. Patient has had an injection. Contraception: Depo-Provera injections Last Pap: 2019. Results were: normal Last mammogram: n/a.   Obstetric History OB History  Gravida Para Term Preterm AB Living  2 2 2     2   SAB IAB Ectopic Multiple Live Births          2    # Outcome Date GA Lbr Len/2nd Weight Sex Delivery Anes PTL Lv  2 Term 2012 [redacted]w[redacted]d   F    LIV  1 Term 2004   6 lb 4 oz (2.835 kg) F    LIV     The following portions of the patient's history were reviewed and updated as appropriate: allergies, current medications, past family history, past medical history, past social history, past surgical history, and problem list.  Review of Systems Pertinent items noted in HPI and remainder of comprehensive ROS otherwise negative.    Objective:   BP 139/90   Pulse 92   Ht 5\' 3"  (1.6 m)   Wt (!) 314 lb (142.4 kg)   BMI 55.62 kg/m  VS reviewed, nursing note reviewed,  Constitutional: well developed, well nourished, no distress HEENT: normocephalic CV: normal rate Pulm/chest wall: normal effort Breast Exam:  exam performed: right breast normal without mass, skin or nipple changes or axillary nodes, left breast normal without mass, skin or nipple changes or axillary nodes Abdomen: soft Neuro: alert and oriented x 3 Skin: warm,  dry Psych: affect normal Pelvic exam:Deferred       Assessment/Plan:   1. Well woman exam with routine gynecological exam --Pt for annual exam to renew Depo Provera --Pap wnl with negative HPV in 2019 --Discussed option of Pap today, or per [facility] can Pap in 2 years, or 5 years from previous normal Pap with cotesting --Pt will forgo Pap today --STD testing at her request, Depo Rx renewed, annual exam in 2023, Pap in 2024  - Cervicovaginal ancillary only( [place]) - HIV Antibody (routine testing w rflx) - Hepatitis B surface antigen - RPR - Hepatitis C antibody - medroxyPROGESTERone Acetate 150 MG/ML SUSY; Inject 1 mL (150 mg total) into the muscle every 3 (three) months.  Dispense: 0.9 mL; Refill: 3     2. Encounter for counseling regarding contraception --Discussed pt contraceptive plans and reviewed contraceptive methods based on pt preferences and effectiveness.  Pt prefers to continue Depo.  Rx renewed.  Next injection end of January.    Follow up in:  1.5 months for Depo injection, then 1 year for annual exam    2025, CNM 8:35 AM

## 2021-03-11 LAB — CERVICOVAGINAL ANCILLARY ONLY
Chlamydia: NEGATIVE
Comment: NEGATIVE
Comment: NEGATIVE
Comment: NORMAL
Neisseria Gonorrhea: NEGATIVE
Trichomonas: NEGATIVE

## 2021-03-11 LAB — RPR: RPR Ser Ql: NONREACTIVE

## 2021-03-11 LAB — HEPATITIS B SURFACE ANTIGEN: Hepatitis B Surface Ag: NEGATIVE

## 2021-03-11 LAB — HEPATITIS C ANTIBODY: Hep C Virus Ab: 0.2 s/co ratio (ref 0.0–0.9)

## 2021-03-11 LAB — HIV ANTIBODY (ROUTINE TESTING W REFLEX): HIV Screen 4th Generation wRfx: NONREACTIVE

## 2021-04-08 ENCOUNTER — Other Ambulatory Visit: Payer: Self-pay

## 2021-04-14 ENCOUNTER — Other Ambulatory Visit: Payer: Self-pay | Admitting: Internal Medicine

## 2021-04-14 DIAGNOSIS — I10 Essential (primary) hypertension: Secondary | ICD-10-CM

## 2021-04-14 NOTE — Telephone Encounter (Signed)
Requested Prescriptions  Pending Prescriptions Disp Refills   hydrochlorothiazide (HYDRODIURIL) 25 MG tablet [Pharmacy Med Name: hydroCHLOROthiazide 25 MG Oral Tablet] 90 tablet 0    Sig: Take 1 tablet by mouth once daily     Cardiovascular: Diuretics - Thiazide Failed - 04/14/2021  8:01 AM      Failed - K in normal range and within 360 days    Potassium  Date Value Ref Range Status  12/19/2020 3.4 (L) 3.5 - 5.2 mmol/L Final         Failed - Last BP in normal range    BP Readings from Last 1 Encounters:  03/10/21 139/90         Passed - Ca in normal range and within 360 days    Calcium  Date Value Ref Range Status  12/19/2020 9.0 8.7 - 10.2 mg/dL Final         Passed - Cr in normal range and within 360 days    Creat  Date Value Ref Range Status  04/14/2016 1.09 0.50 - 1.10 mg/dL Final   Creatinine, Ser  Date Value Ref Range Status  12/19/2020 1.00 0.57 - 1.00 mg/dL Final         Passed - Na in normal range and within 360 days    Sodium  Date Value Ref Range Status  12/19/2020 144 134 - 144 mmol/L Final         Passed - Valid encounter within last 6 months    Recent Outpatient Visits          3 months ago Essential hypertension   [facility] Marcine Matar, MD   8 months ago On continuous oral anticoagulation   [facility] Marcine Matar, MD   12 months ago Influenza vaccine needed   [facility] [place], Cornelius Moras, RPH-CPP   12 months ago Essential hypertension   [facility] Marcine Matar, MD   1 year ago Essential hypertension   [facility] Marcine Matar, MD

## 2021-04-24 ENCOUNTER — Ambulatory Visit (INDEPENDENT_AMBULATORY_CARE_PROVIDER_SITE_OTHER): Payer: Medicaid Other

## 2021-04-24 ENCOUNTER — Other Ambulatory Visit: Payer: Self-pay

## 2021-04-24 DIAGNOSIS — Z3042 Encounter for surveillance of injectable contraceptive: Secondary | ICD-10-CM

## 2021-04-24 MED ORDER — MEDROXYPROGESTERONE ACETATE 150 MG/ML IM SUSP
150.0000 mg | Freq: Once | INTRAMUSCULAR | Status: AC
  Administered 2021-04-24: 150 mg via INTRAMUSCULAR

## 2021-04-24 NOTE — Progress Notes (Addendum)
Subjective:  Pt in for pt supply Depo Provera injection.    Objective: Need for contraception. No unusual complaints.    Assessment: Depo given RD. Pt tolerated Depo injection.   Plan:  Pt scheduled 07/10/21 for next Depo.

## 2021-04-24 NOTE — Progress Notes (Signed)
Patient was assessed and managed by nursing staff during this encounter. I have reviewed the chart and agree with the documentation and plan. I have also made any necessary editorial changes.  Catalina Antigua, MD 04/24/2021 5:44 PM

## 2021-05-05 ENCOUNTER — Other Ambulatory Visit: Payer: Self-pay | Admitting: Internal Medicine

## 2021-05-05 DIAGNOSIS — Z86711 Personal history of pulmonary embolism: Secondary | ICD-10-CM

## 2021-05-06 ENCOUNTER — Other Ambulatory Visit: Payer: Self-pay

## 2021-05-06 MED ORDER — RIVAROXABAN 20 MG PO TABS
ORAL_TABLET | Freq: Every day | ORAL | 5 refills | Status: DC
  Filled 2021-05-06: qty 30, 30d supply, fill #0
  Filled 2021-05-06: qty 30, fill #0
  Filled 2021-05-13: qty 30, 30d supply, fill #0
  Filled 2021-06-17: qty 30, 30d supply, fill #1
  Filled 2021-07-15: qty 30, 30d supply, fill #2
  Filled 2021-08-12: qty 30, 30d supply, fill #3
  Filled 2021-09-11 – 2021-09-18 (×2): qty 30, 30d supply, fill #4
  Filled 2021-10-13: qty 30, 30d supply, fill #5

## 2021-05-06 NOTE — Telephone Encounter (Signed)
Per Dr. Wynetta Emery lab notes 12/19/2020 - You have a mild persistent elevation in your white blood cell counts and platelet cell counts. We will continue to observe for now. Requested Prescriptions  Pending Prescriptions Disp Refills   rivaroxaban (XARELTO) 20 MG TABS tablet 30 tablet 5    Sig: TAKE 1 TABLET (20 MG TOTAL) BY MOUTH DAILY WITH SUPPER.     Hematology: Anticoagulants - rivaroxaban Failed - 05/05/2021  9:58 PM      Failed - PLT in normal range and within 360 days    Platelets  Date Value Ref Range Status  12/19/2020 474 (H) 150 - 450 x10E3/uL Final         Passed - ALT in normal range and within 180 days    ALT  Date Value Ref Range Status  12/19/2020 15 0 - 32 IU/L Final         Passed - AST in normal range and within 180 days    AST  Date Value Ref Range Status  12/19/2020 16 0 - 40 IU/L Final         Passed - Cr in normal range and within 360 days    Creat  Date Value Ref Range Status  04/14/2016 1.09 0.50 - 1.10 mg/dL Final   Creatinine, Ser  Date Value Ref Range Status  12/19/2020 1.00 0.57 - 1.00 mg/dL Final         Passed - HCT in normal range and within 360 days    Hematocrit  Date Value Ref Range Status  12/19/2020 39.7 34.0 - 46.6 % Final         Passed - HGB in normal range and within 360 days    Hemoglobin  Date Value Ref Range Status  12/19/2020 12.9 11.1 - 15.9 g/dL Final         Passed - Valid encounter within last 12 months    Recent Outpatient Visits          4 months ago Essential hypertension   [place] Ladell Pier, MD   8 months ago On continuous oral anticoagulation   [place] Ladell Pier, MD   1 year ago Influenza vaccine needed   [place], Jarome Matin, RPH-CPP   1 year ago Essential hypertension   [place] Ladell Pier, MD   1 year ago Essential hypertension   [place] Ladell Pier, MD

## 2021-05-07 ENCOUNTER — Other Ambulatory Visit: Payer: Self-pay

## 2021-05-13 ENCOUNTER — Other Ambulatory Visit: Payer: Self-pay

## 2021-06-18 ENCOUNTER — Other Ambulatory Visit: Payer: Self-pay

## 2021-06-19 ENCOUNTER — Other Ambulatory Visit: Payer: Self-pay

## 2021-07-06 ENCOUNTER — Other Ambulatory Visit: Payer: Self-pay | Admitting: Internal Medicine

## 2021-07-06 DIAGNOSIS — I10 Essential (primary) hypertension: Secondary | ICD-10-CM

## 2021-07-08 NOTE — Telephone Encounter (Signed)
Requested medication (s) are due for refill today: yes ? ?Requested medication (s) are on the active medication list: yes ? ?Last refill:  04/14/21 #90/0 ? ?Future visit scheduled: no ? ?Notes to clinic:  pt is overdue for an appt and updated labs. Pt called, LVMTCB ? ? ?  ?Requested Prescriptions  ?Pending Prescriptions Disp Refills  ? hydrochlorothiazide (HYDRODIURIL) 25 MG tablet [Pharmacy Med Name: hydroCHLOROthiazide 25 MG Oral Tablet] 90 tablet 0  ?  Sig: Take 1 tablet by mouth once daily  ?  ? Cardiovascular: Diuretics - Thiazide Failed - 07/06/2021 10:00 AM  ?  ?  Failed - Cr in normal range and within 180 days  ?  Creat  ?Date Value Ref Range Status  ?04/14/2016 1.09 0.50 - 1.10 mg/dL Final  ? ?Creatinine, Ser  ?Date Value Ref Range Status  ?12/19/2020 1.00 0.57 - 1.00 mg/dL Final  ?  ?  ?  ?  Failed - K in normal range and within 180 days  ?  Potassium  ?Date Value Ref Range Status  ?12/19/2020 3.4 (L) 3.5 - 5.2 mmol/L Final  ?  ?  ?  ?  Failed - Na in normal range and within 180 days  ?  Sodium  ?Date Value Ref Range Status  ?12/19/2020 144 134 - 144 mmol/L Final  ?  ?  ?  ?  Failed - Last BP in normal range  ?  BP Readings from Last 1 Encounters:  ?03/10/21 139/90  ?  ?  ?  ?  Failed - Valid encounter within last 6 months  ?  Recent Outpatient Visits   ? ?      ? 6 months ago Essential hypertension  ? [facility] [name] B, MD  ? 10 months ago On continuous oral anticoagulation  ? [facility] Marcine Matar, MD  ? 1 year ago Influenza vaccine needed  ? [facility] Lois Huxley, Cornelius Moras, RPH-CPP  ? 1 year ago Essential hypertension  ? [facility] Marcine Matar, MD  ? 1 year ago Essential hypertension  ? [facility] Marcine Matar, MD  ? ?  ?  ? ?  ?  ?  ? ?

## 2021-07-08 NOTE — Telephone Encounter (Signed)
Patient called, left VM to return the call to the office to scheduled an appt for medication refill request.   

## 2021-07-10 ENCOUNTER — Ambulatory Visit (INDEPENDENT_AMBULATORY_CARE_PROVIDER_SITE_OTHER): Payer: BC Managed Care – PPO | Admitting: *Deleted

## 2021-07-10 ENCOUNTER — Ambulatory Visit: Payer: Medicaid Other

## 2021-07-10 DIAGNOSIS — Z3042 Encounter for surveillance of injectable contraceptive: Secondary | ICD-10-CM | POA: Diagnosis not present

## 2021-07-10 MED ORDER — MEDROXYPROGESTERONE ACETATE 150 MG/ML IM SUSP
150.0000 mg | Freq: Once | INTRAMUSCULAR | Status: AC
  Administered 2021-07-10: 150 mg via INTRAMUSCULAR

## 2021-07-10 NOTE — Progress Notes (Signed)
Date last pap: 12/28/17 (annual 03/10/21). ?Last Depo-Provera: 04/24/21. ?Side Effects if any: NA. ?Serum HCG indicated? NA. ?Depo-Provera 150 mg IM given by: Selena Batten. Eleisha Branscomb, RNC in Left Deltoid. ?Next appointment due 09/25/21-10/09/21.  ?

## 2021-07-16 ENCOUNTER — Other Ambulatory Visit: Payer: Self-pay

## 2021-07-18 ENCOUNTER — Other Ambulatory Visit: Payer: Self-pay

## 2021-07-21 ENCOUNTER — Telehealth: Payer: Self-pay | Admitting: Internal Medicine

## 2021-07-21 DIAGNOSIS — I10 Essential (primary) hypertension: Secondary | ICD-10-CM

## 2021-07-22 ENCOUNTER — Other Ambulatory Visit: Payer: Self-pay

## 2021-07-22 MED ORDER — HYDROCHLOROTHIAZIDE 25 MG PO TABS: 25.0000 mg | ORAL_TABLET | Freq: Every day | ORAL | 0 refills | Status: DC

## 2021-07-22 NOTE — Addendum Note (Signed)
Addended by: Lois Huxley, Jeannett Senior L on: 07/22/2021 12:17 PM ? ? Modules accepted: Orders ? ?

## 2021-07-22 NOTE — Telephone Encounter (Signed)
Rx sent 

## 2021-07-22 NOTE — Telephone Encounter (Signed)
Pt called to report that she is scheduled for July, she says she will reschedule if PCP needs to see her sooner. Please advise, ? ?Pt aware that Rx refusal is because patient needs an appt. She declined offer to reschedule for now.  ?

## 2021-07-24 ENCOUNTER — Other Ambulatory Visit: Payer: Self-pay | Admitting: Internal Medicine

## 2021-07-24 DIAGNOSIS — I10 Essential (primary) hypertension: Secondary | ICD-10-CM

## 2021-07-24 NOTE — Telephone Encounter (Signed)
Patient called in stated that per a Richard at [facility] Dr Laural Benes denied her refill but per notes Rx was sent and transaction was completed by [facility]. Please advise and call patient she is out of her medication Can be reached at Ph# [phone] ?

## 2021-07-24 NOTE — Telephone Encounter (Signed)
Left VM for pt to call back and confirm which meds are needed. ?

## 2021-07-25 ENCOUNTER — Other Ambulatory Visit: Payer: Self-pay | Admitting: Internal Medicine

## 2021-07-25 ENCOUNTER — Telehealth: Payer: Self-pay | Admitting: Internal Medicine

## 2021-07-25 DIAGNOSIS — I10 Essential (primary) hypertension: Secondary | ICD-10-CM

## 2021-07-25 NOTE — Telephone Encounter (Signed)
I checked to see why the hydrochlorothiazide 25 mg was not at the [facility] on [address].   It did not transmit when sent on 07/22/2021.   I resent it and the pharmacy receipt confirmed they received it today 07/25/2021 at 1:23 PM/   Pt remained on phone with me while I resent it and confirmed it went through.    She thanked me for helping her.    ? ? ?

## 2021-07-25 NOTE — Telephone Encounter (Signed)
Attempted to call patient- left message to call back. Follow up call regarding medications- need to make sure she was able to get her medications ?

## 2021-07-25 NOTE — Telephone Encounter (Signed)
Pt called back in however the line disconnected or she hung up.   I called her back.  I'm out of my BP medication.   [facility] said it didn't go through.   Dr. Laural Benes denied my BP mediction.    They said they were going to send another one to the pharmacy.   They still don't have it.    ?

## 2021-07-25 NOTE — Telephone Encounter (Signed)
Medication Refill - Medication: hydrochlorothiazide (HYDRODIURIL) 25 MG tablet [[id number]]  ? ?Pt is calling to report that the pharmacy did not receive this refill. Can this please be resent? ? ?Has the patient contacted their pharmacy? Yes.   ? ? ?Preferred Pharmacy (with phone number or street name): [id number] - [place] [place] - [address]  ?[address], [place] ([place]) [place] [address]  ?Phone:  [phone]  Fax:  [phone]  ? ? ?Has the patient been seen for an appointment in the last year OR does the patient have an upcoming appointment? Yes.  7/23 ? ?Agent: Please be advised that RX refills may take up to 3 business days. We ask that you follow-up with your pharmacy. ? ?

## 2021-07-25 NOTE — Telephone Encounter (Signed)
Noted. Pharmacy now has the rx. ?

## 2021-07-25 NOTE — Telephone Encounter (Signed)
Resent BP medication to [facility] on [address] because it did not transmit on 07/22/2021. ?

## 2021-08-13 ENCOUNTER — Other Ambulatory Visit: Payer: Self-pay

## 2021-08-18 ENCOUNTER — Ambulatory Visit: Payer: BC Managed Care – PPO | Attending: Internal Medicine | Admitting: Internal Medicine

## 2021-08-18 ENCOUNTER — Encounter: Payer: Self-pay | Admitting: Internal Medicine

## 2021-08-18 VITALS — BP 112/79 | HR 97 | Temp 98.5°F | Resp 18 | Ht 63.0 in | Wt 330.0 lb

## 2021-08-18 DIAGNOSIS — I10 Essential (primary) hypertension: Secondary | ICD-10-CM | POA: Diagnosis not present

## 2021-08-18 DIAGNOSIS — Z7901 Long term (current) use of anticoagulants: Secondary | ICD-10-CM | POA: Diagnosis not present

## 2021-08-18 DIAGNOSIS — D72829 Elevated white blood cell count, unspecified: Secondary | ICD-10-CM

## 2021-08-18 DIAGNOSIS — R7303 Prediabetes: Secondary | ICD-10-CM

## 2021-08-18 DIAGNOSIS — Z6841 Body Mass Index (BMI) 40.0 and over, adult: Secondary | ICD-10-CM

## 2021-08-18 DIAGNOSIS — D75839 Thrombocytosis, unspecified: Secondary | ICD-10-CM | POA: Diagnosis not present

## 2021-08-18 DIAGNOSIS — Z86711 Personal history of pulmonary embolism: Secondary | ICD-10-CM

## 2021-08-18 MED ORDER — HYDROCHLOROTHIAZIDE 25 MG PO TABS: 25.0000 mg | ORAL_TABLET | Freq: Every day | ORAL | 3 refills | Status: DC

## 2021-08-18 MED ORDER — POTASSIUM CHLORIDE CRYS ER 20 MEQ PO TBCR: 20.0000 meq | EXTENDED_RELEASE_TABLET | Freq: Every day | ORAL | 2 refills | Status: DC

## 2021-08-18 NOTE — Patient Instructions (Signed)
Healthy Eating ?Following a healthy eating pattern may help you to achieve and maintain a healthy body weight, reduce the risk of chronic disease, and live a long and productive life. It is important to follow a healthy eating pattern at an appropriate calorie level for your body. Your nutritional needs should be met primarily through food by choosing a variety of nutrient-rich foods. ?What are tips for following this plan? ?Reading food labels ?Read labels and choose the following: ?Reduced or low sodium. ?Juices with 100% fruit juice. ?Foods with low saturated fats and high polyunsaturated and monounsaturated fats. ?Foods with whole grains, such as whole wheat, cracked wheat, brown rice, and wild rice. ?Whole grains that are fortified with folic acid. This is recommended for women who are pregnant or who want to become pregnant. ?Read labels and avoid the following: ?Foods with a lot of added sugars. These include foods that contain brown sugar, corn sweetener, corn syrup, dextrose, fructose, glucose, high-fructose corn syrup, honey, invert sugar, lactose, malt syrup, maltose, molasses, raw sugar, sucrose, trehalose, or turbinado sugar. ?Do not eat more than the following amounts of added sugar per day: ?6 teaspoons (25 g) for women. ?9 teaspoons (38 g) for men. ?Foods that contain processed or refined starches and grains. ?Refined grain products, such as white flour, degermed cornmeal, white bread, and white rice. ?Shopping ?Choose nutrient-rich snacks, such as vegetables, whole fruits, and nuts. Avoid high-calorie and high-sugar snacks, such as potato chips, fruit snacks, and candy. ?Use oil-based dressings and spreads on foods instead of solid fats such as butter, stick margarine, or cream cheese. ?Limit pre-made sauces, mixes, and "instant" products such as flavored rice, instant noodles, and ready-made pasta. ?Try more plant-protein sources, such as tofu, tempeh, black beans, edamame, lentils, nuts, and  seeds. ?Explore eating plans such as the Mediterranean diet or vegetarian diet. ?Cooking ?Use oil to saut? or stir-fry foods instead of solid fats such as butter, stick margarine, or lard. ?Try baking, boiling, grilling, or broiling instead of frying. ?Remove the fatty part of meats before cooking. ?Steam vegetables in water or broth. ?Meal planning ? ?At meals, imagine dividing your plate into fourths: ?One-half of your plate is fruits and vegetables. ?One-fourth of your plate is whole grains. ?One-fourth of your plate is protein, especially lean meats, poultry, eggs, tofu, beans, or nuts. ?Include low-fat dairy as part of your daily diet. ?Lifestyle ?Choose healthy options in all settings, including home, work, school, restaurants, or stores. ?Prepare your food safely: ?Wash your hands after handling raw meats. ?Keep food preparation surfaces clean by regularly washing with hot, soapy water. ?Keep raw meats separate from ready-to-eat foods, such as fruits and vegetables. ?Cook seafood, meat, poultry, and eggs to the recommended internal temperature. ?Store foods at safe temperatures. In general: ?Keep cold foods at 40?F (4.4?C) or below. ?Keep hot foods at 140?F (60?C) or above. ?Keep your freezer at 0?F (-17.8?C) or below. ?Foods are no longer safe to eat when they have been between the temperatures of 40?-140?F (4.4-60?C) for more than 2 hours. ?What foods should I eat? ?Fruits ?Aim to eat 2 cup-equivalents of fresh, canned (in natural juice), or frozen fruits each day. Examples of 1 cup-equivalent of fruit include 1 small apple, 8 large strawberries, 1 cup canned fruit, ? cup dried fruit, or 1 cup 100% juice. ?Vegetables ?Aim to eat 2?-3 cup-equivalents of fresh and frozen vegetables each day, including different varieties and colors. Examples of 1 cup-equivalent of vegetables include 2 medium carrots, 2 cups raw,  leafy greens, 1 cup chopped vegetable (raw or cooked), or 1 medium baked potato. ?Grains ?Aim to  eat 6 ounce-equivalents of whole grains each day. Examples of 1 ounce-equivalent of grains include 1 slice of bread, 1 cup ready-to-eat cereal, 3 cups popcorn, or ? cup cooked rice, pasta, or cereal. ?Meats and other proteins ?Aim to eat 5-6 ounce-equivalents of protein each day. Examples of 1 ounce-equivalent of protein include 1 egg, 1/2 cup nuts or seeds, or 1 tablespoon (16 g) peanut butter. A cut of meat or fish that is the size of a deck of cards is about 3-4 ounce-equivalents. ?Of the protein you eat each week, try to have at least 8 ounces come from seafood. This includes salmon, trout, herring, and anchovies. ?Dairy ?Aim to eat 3 cup-equivalents of fat-free or low-fat dairy each day. Examples of 1 cup-equivalent of dairy include 1 cup (240 mL) milk, 8 ounces (250 g) yogurt, 1? ounces (44 g) natural cheese, or 1 cup (240 mL) fortified soy milk. ?Fats and oils ?Aim for about 5 teaspoons (21 g) per day. Choose monounsaturated fats, such as canola and olive oils, avocados, peanut butter, and most nuts, or polyunsaturated fats, such as sunflower, corn, and soybean oils, walnuts, pine nuts, sesame seeds, sunflower seeds, and flaxseed. ?Beverages ?Aim for six 8-oz glasses of water per day. Limit coffee to three to five 8-oz cups per day. ?Limit caffeinated beverages that have added calories, such as soda and energy drinks. ?Limit alcohol intake to no more than 1 drink a day for nonpregnant women and 2 drinks a day for men. One drink equals 12 oz of beer (355 mL), 5 oz of wine (148 mL), or 1? oz of hard liquor (44 mL). ?Seasoning and other foods ?Avoid adding excess amounts of salt to your foods. Try flavoring foods with herbs and spices instead of salt. ?Avoid adding sugar to foods. ?Try using oil-based dressings, sauces, and spreads instead of solid fats. ?This information is based on general U.[place] nutrition guidelines. For more information, visit choosemyplate.gov. Exact amounts may vary based on your nutrition  needs. ?Summary ?A healthy eating plan may help you to maintain a healthy weight, reduce the risk of chronic diseases, and stay active throughout your life. ?Plan your meals. Make sure you eat the right portions of a variety of nutrient-rich foods. ?Try baking, boiling, grilling, or broiling instead of frying. ?Choose healthy options in all settings, including home, work, school, restaurants, or stores. ?This information is not intended to replace advice given to you by your health care provider. Make sure you discuss any questions you have with your health care provider. ?Document Revised: 11/19/2020 Document Reviewed: 11/19/2020 ?Elsevier Patient Education ? 2023 Elsevier Inc. ? ?

## 2021-08-18 NOTE — Progress Notes (Signed)
? ? ?Patient ID: Karen Robles, female    DOB: [birth year]  MRN: [id number] ? ?CC: Hypertension and Medication Refill ? ? ?Subjective: ?Karen Robles is a 41 y.o. female who presents for chronic ds management ?Her concerns today include:  ?Pt with hx of PE in 2015 unprovoked, pre-DM, HTN, COVID infection 05/2019 and morbid obesity. ? ?HYPERTENSION ?Currently taking: see medication list. HCTZ with Potassium supplement ?Med Adherence: [x]  Yes    []  No ?Medication side effects: []  Yes    [x]  No ?Adherence with salt restriction: [x]  Yes    []  No ?Home Monitoring?: []  Yes    [x]  No ?Monitoring Frequency:  ?Home BP results range:  ?SOB? []  Yes    [x]  No ?Chest Pain?: []  Yes    [x]  No ?Leg swelling?: []  Yes    [x]  No ?Headaches?: [x]  Yes- sometimes    []  No ?Dizziness? []  Yes    [x]  No ?Comments:  ? ?DVT hx:  taking and tolerating Xarelto.  No bruising or bleeding ? ?PreDM/Obese: gained 16 lbs since 03/2022.   ?She is on Depo ?"I've been eating junk food." Eating fast food for breakfast and lunch.  Works at and gets 1 hour for lunch. ?Feels she gets a lot of exercise.  Moved to a split level apartment where she has to go up and down the steps.  Also reports she moves a lot at work.  Her daughter also wants to start walking at the park. They plan to start next weekend.  ? ?Patient has had mild persistent elevation in platelet count and WBCs on previous CBCs.  She has not had any bruising or bleeding.  We will plan to recheck today. ? ? ?Patient Active Problem List  ? Diagnosis Date Noted  ? Influenza vaccine needed 04/16/2020  ? COVID-19 vaccine series completed 04/16/2020  ? History of pulmonary embolism 07/22/2017  ? Surveillance of contraceptive injection 06/11/2016  ? Severe obesity (BMI >= 40) (HCC) 07/26/2013  ? Essential hypertension 03/07/2013  ?  ? ?Current Outpatient Medications on File Prior to Visit  ?Medication Sig Dispense Refill  ? medroxyPROGESTERone Acetate 150 MG/ML SUSY Inject 1 mL (150 mg total)  into the muscle every 3 (three) months. 0.9 mL 3  ? rivaroxaban (XARELTO) 20 MG TABS tablet TAKE 1 TABLET (20 MG TOTAL) BY MOUTH DAILY WITH SUPPER. 30 tablet 5  ? ?No current facility-administered medications on file prior to visit.  ? ? ?No Known Allergies ? ?Social History  ? ?Socioeconomic History  ? Marital status: Single  ?  Spouse name: Not on file  ? Number of children: 2  ? Years of education: Not on file  ? Highest education level: Not on file  ?Occupational History  ? Occupation: Unemployed  ?Tobacco Use  ? Smoking status: Never  ? Smokeless tobacco: Never  ?Vaping Use  ? Vaping Use: Never used  ?Substance and Sexual Activity  ? Alcohol use: No  ?  Alcohol/week: 0.0 standard drinks  ? Drug use: No  ? Sexual activity: Yes  ?  Partners: Male  ?  Birth control/protection: Injection  ?Other Topics Concern  ? Not on file  ?Social History Narrative  ? Not on file  ? ?Social Determinants of Health  ? ?Financial Resource Strain: Not on file  ?Food Insecurity: Not on file  ?Transportation Needs: Not on file  ?Physical Activity: Not on file  ?Stress: Not on file  ?Social Connections: Not on file  ?Intimate Partner Violence: Not on  file  ? ? ?Family History  ?Problem Relation Age of Onset  ? Hypertension Mother   ?     maternal aunts & uncles  ? Hyperlipidemia Mother   ? Healthy Brother   ? Hypertension Maternal Aunt   ? Hypertension Maternal Uncle   ? Cancer Maternal Grandmother   ? Diabetes Neg Hx   ? Heart disease Neg Hx   ? ? ?Past Surgical History:  ?Procedure Laterality Date  ? MOUTH SURGERY    ? WISDOM TOOTH EXTRACTION Bilateral   ? ? ?ROS: ?Review of Systems ?Negative except as stated above ? ?PHYSICAL EXAM: ?BP 112/79 (BP Location: Left Arm, Patient Position: Sitting, Cuff Size: Large)   Pulse 97   Temp 98.5 ?F (36.9 ?C)   Resp 18   Ht 5\' 3"  (1.6 m)   Wt (!) 330 lb (149.7 kg)   SpO2 97%   BMI 58.46 kg/m?   ?Wt Readings from Last 3 Encounters:  ?08/18/21 (!) 330 lb (149.7 kg)  ?03/10/21 (!) 314 lb  (142.4 kg)  ?11/12/20 (!) 307 lb (139.3 kg)  ? ? ?Physical Exam ? ?General appearance - alert, well appearing, morbidly obese young to middle-aged African-American female and in no distress ?Mental status - normal mood, behavior, speech, dress, motor activity, and thought processes ?Neck - supple, no significant adenopathy ?Chest - clear to auscultation, no wheezes, rales or rhonchi, symmetric air entry ?Heart - normal rate, regular rhythm, normal S1, S2, no murmurs, rubs, clicks or gallops ?Extremities - peripheral pulses normal, no pedal edema, no clubbing or cyanosis ? ? ? ?  Latest Ref Rng & Units 12/19/2020  ? 11:34 AM 08/27/2020  ?  4:14 PM 04/16/2020  ?  2:52 PM  ?CMP  ?Glucose 65 - 99 mg/dL 161120    78    ?BUN 6 - 20 mg/dL 19    18    ?Creatinine 0.57 - 1.00 mg/dL 0.961.00    0.451.04    ?Sodium 134 - 144 mmol/L 144    146    ?Potassium 3.5 - 5.2 mmol/L 3.4   3.1   3.8    ?Chloride 96 - 106 mmol/L 104    103    ?CO2 20 - 29 mmol/L 25    27    ?Calcium 8.7 - 10.2 mg/dL 9.0    9.0    ?Total Protein 6.0 - 8.5 g/dL 7.1      ?Total Bilirubin 0.0 - 1.2 mg/dL 0.3      ?Alkaline Phos 44 - 121 IU/L 81      ?AST 0 - 40 IU/L 16      ?ALT 0 - 32 IU/L 15      ? ?Lipid Panel  ?   ?Component Value Date/Time  ? CHOL 146 12/19/2020 1134  ? TRIG 84 12/19/2020 1134  ? HDL 35 (L) 12/19/2020 1134  ? CHOLHDL 4.2 12/19/2020 1134  ? CHOLHDL 3.9 09/05/2013 1148  ? VLDL 24 09/05/2013 1148  ? LDLCALC 95 12/19/2020 1134  ? ? ?CBC ?   ?Component Value Date/Time  ? WBC 11.5 (H) 12/19/2020 1134  ? WBC 9.3 04/14/2016 0918  ? RBC 4.97 12/19/2020 1134  ? RBC 4.82 04/14/2016 0918  ? HGB 12.9 12/19/2020 1134  ? HCT 39.7 12/19/2020 1134  ? PLT 474 (H) 12/19/2020 1134  ? MCV 80 12/19/2020 1134  ? MCH 26.0 (L) 12/19/2020 1134  ? MCH 26.6 (L) 04/14/2016 [id number]  ? MCHC 32.5 12/19/2020 1134  ? MCHC 32.7  04/14/2016 5462  ? RDW 13.2 12/19/2020 1134  ? LYMPHSABS 4.8 (H) 04/16/2020 1452  ? MONOABS 279 04/14/2016 0918  ? EOSABS 0.3 04/16/2020 1452  ? BASOSABS 0.1  04/16/2020 1452  ? ? ?ASSESSMENT AND PLAN: ?1. Essential hypertension ?At goal.  Continue hydrochlorothiazide along with potassium supplement.  Try to limit salt in the foods. ?- hydrochlorothiazide (HYDRODIURIL) 25 MG tablet; Take 1 tablet (25 mg total) by mouth daily.  Dispense: 90 tablet; Refill: 3 ?- potassium chloride SA (KLOR-CON M) 20 MEQ tablet; Take 1 tablet (20 mEq total) by mouth daily.  Dispense: 90 tablet; Refill: 2 ?- Comprehensive metabolic panel ? ?2. Morbid obesity with BMI of 50.0-59.9, adult (HCC) ?Contributing factors include poor dietary habits and being on Depo-Provera birth control shot. ?-Discussed and encouraged change in dietary habits.  Encourage better meal planning to avoid having to buy fast food. ?She is agreeable to seeing a nutritionist if it is covered by her insurance. ?Encouraged her to move as much as she can. ?Discussed with her other methods of birth control that would not cause weight gain like copper IUD which she can consider.  However patient feels the weight gain is more due to poor dietary habits. ?- Hemoglobin A1c ?- Amb ref to Medical Nutrition Therapy-MNT ? ?3. Prediabetes ?See #2 above ?- Hemoglobin A1c ?- Amb ref to Medical Nutrition Therapy-MNT ? ?4. History of pulmonary embolism ?5. On continuous oral anticoagulation ?Continue Xarelto. ?- CBC With Differential ? ?6. Thrombocytosis ?Recheck CBC with differential today ?- CBC With Differential ? ?7. Leukocytosis, unspecified type ?Recheck CBC with differential today.  If the increased platelet and WBC counts persist or worsens, discuss referral to heme/onc. ?- CBC With Differential ? ? ? ?Patient was given the opportunity to ask questions.  Patient verbalized understanding of the plan and was able to repeat key elements of the plan.  ? ?This documentation was completed using Paediatric nurse.  Any transcriptional errors are unintentional. ? ?Orders Placed This Encounter  ?Procedures  ?  Comprehensive metabolic panel  ? CBC With Differential  ? Hemoglobin A1c  ? Amb ref to Medical Nutrition Therapy-MNT  ? ? ? ?Requested Prescriptions  ? ?Signed Prescriptions Disp Refills  ? hydrochlorothiazide (HYDRODIURIL) 25

## 2021-08-18 NOTE — Progress Notes (Signed)
Pt presents for hypertension f/u, needs medication refills Xarelto needs to be sent to [facility] pharmacy ?

## 2021-08-19 ENCOUNTER — Other Ambulatory Visit: Payer: Self-pay | Admitting: Internal Medicine

## 2021-08-19 DIAGNOSIS — D72829 Elevated white blood cell count, unspecified: Secondary | ICD-10-CM

## 2021-08-19 LAB — COMPREHENSIVE METABOLIC PANEL
ALT: 19 IU/L (ref 0–32)
AST: 18 IU/L (ref 0–40)
Albumin/Globulin Ratio: 1 — ABNORMAL LOW (ref 1.2–2.2)
Albumin: 3.7 g/dL — ABNORMAL LOW (ref 3.8–4.8)
Alkaline Phosphatase: 84 IU/L (ref 44–121)
BUN/Creatinine Ratio: 21 (ref 9–23)
BUN: 23 mg/dL — ABNORMAL HIGH (ref 6–20)
Bilirubin Total: <0.2 mg/dL (ref 0.0–1.2)
CO2: 23 mmol/L (ref 20–29)
Calcium: 9.1 mg/dL (ref 8.7–10.2)
Chloride: 102 mmol/L (ref 96–106)
Creatinine, Ser: 1.09 mg/dL — ABNORMAL HIGH (ref 0.57–1.00)
Globulin, Total: 3.8 g/dL (ref 1.5–4.5)
Glucose: 85 mg/dL (ref 70–99)
Potassium: 3.6 mmol/L (ref 3.5–5.2)
Sodium: 138 mmol/L (ref 134–144)
Total Protein: 7.5 g/dL (ref 6.0–8.5)
eGFR: 67 mL/min/{1.73_m2} (ref >59)

## 2021-08-19 LAB — CBC WITH DIFFERENTIAL
Basophils Absolute: 0.1 10*3/uL (ref 0.0–0.2)
Basos: 1 %
EOS (ABSOLUTE): 0.3 10*3/uL (ref 0.0–0.4)
Eos: 3 %
Hematocrit: 41.3 % (ref 34.0–46.6)
Hemoglobin: 13 g/dL (ref 11.1–15.9)
Immature Grans (Abs): 0.1 10*3/uL (ref 0.0–0.1)
Immature Granulocytes: 1 %
Lymphocytes Absolute: 3.6 10*3/uL — ABNORMAL HIGH (ref 0.7–3.1)
Lymphs: 29 %
MCH: 25.7 pg — ABNORMAL LOW (ref 26.6–33.0)
MCHC: 31.5 g/dL (ref 31.5–35.7)
MCV: 82 fL (ref 79–97)
Monocytes Absolute: 0.6 10*3/uL (ref 0.1–0.9)
Monocytes: 5 %
Neutrophils Absolute: 7.8 10*3/uL — ABNORMAL HIGH (ref 1.4–7.0)
Neutrophils: 61 %
RBC: 5.05 x10E6/uL (ref 3.77–5.28)
RDW: 13.6 % (ref 11.7–15.4)
WBC: 12.4 10*3/uL — ABNORMAL HIGH (ref 3.4–10.8)

## 2021-08-19 LAB — HEMOGLOBIN A1C
Est. average glucose Bld gHb Est-mCnc: 123 mg/dL
Hgb A1c MFr Bld: 5.9 % — ABNORMAL HIGH (ref 4.8–5.6)

## 2021-08-21 ENCOUNTER — Telehealth: Payer: Self-pay | Admitting: Hematology and Oncology

## 2021-08-21 NOTE — Telephone Encounter (Signed)
Scheduled appt per 5/16 referral. Pt is aware of appt date and time. Pt is aware to arrive 15 mins prior to appt time and to bring and updated insurance card. Pt is aware of appt location.   

## 2021-08-22 ENCOUNTER — Other Ambulatory Visit: Payer: Self-pay

## 2021-09-11 ENCOUNTER — Other Ambulatory Visit: Payer: Self-pay

## 2021-09-11 ENCOUNTER — Inpatient Hospital Stay: Payer: BC Managed Care – PPO | Attending: Hematology and Oncology | Admitting: Hematology and Oncology

## 2021-09-11 ENCOUNTER — Inpatient Hospital Stay: Payer: BC Managed Care – PPO

## 2021-09-17 ENCOUNTER — Other Ambulatory Visit: Payer: Self-pay

## 2021-09-18 ENCOUNTER — Other Ambulatory Visit: Payer: Self-pay

## 2021-10-03 ENCOUNTER — Other Ambulatory Visit: Payer: Self-pay | Admitting: Internal Medicine

## 2021-10-03 DIAGNOSIS — I10 Essential (primary) hypertension: Secondary | ICD-10-CM

## 2021-10-06 NOTE — Telephone Encounter (Signed)
First transmission failed.   Resending.  Requested Prescriptions  Pending Prescriptions Disp Refills  . hydrochlorothiazide (HYDRODIURIL) 25 MG tablet [Pharmacy Med Name: hydroCHLOROthiazide 25 MG Oral Tablet] 90 tablet 0    Sig: Take 1 tablet by mouth once daily     Cardiovascular: Diuretics - Thiazide Failed - 10/03/2021 10:00 PM      Failed - Cr in normal range and within 180 days    Creat  Date Value Ref Range Status  04/14/2016 1.09 0.50 - 1.10 mg/dL Final   Creatinine, Ser  Date Value Ref Range Status  08/18/2021 1.09 (H) 0.57 - 1.00 mg/dL Final         Passed - K in normal range and within 180 days    Potassium  Date Value Ref Range Status  08/18/2021 3.6 3.5 - 5.2 mmol/L Final         Passed - Na in normal range and within 180 days    Sodium  Date Value Ref Range Status  08/18/2021 138 134 - 144 mmol/L Final         Passed - Last BP in normal range    BP Readings from Last 1 Encounters:  08/18/21 112/79         Passed - Valid encounter within last 6 months    Recent Outpatient Visits          1 month ago Essential hypertension   [facility] Marcine Matar, MD   9 months ago Essential hypertension   [facility] Marcine Matar, MD   1 year ago On continuous oral anticoagulation   [facility] Marcine Matar, MD   1 year ago Influenza vaccine needed   [facility] Lois Huxley, Cornelius Moras, RPH-CPP   1 year ago Essential hypertension   [facility] Marcine Matar, MD      Future Appointments            In 2 months Laural Benes Binnie Rail, MD [facility]

## 2021-10-09 ENCOUNTER — Ambulatory Visit (INDEPENDENT_AMBULATORY_CARE_PROVIDER_SITE_OTHER): Payer: BC Managed Care – PPO | Admitting: Emergency Medicine

## 2021-10-09 VITALS — BP 140/82 | HR 94 | Wt 330.0 lb

## 2021-10-09 DIAGNOSIS — Z3042 Encounter for surveillance of injectable contraceptive: Secondary | ICD-10-CM | POA: Diagnosis not present

## 2021-10-09 MED ORDER — MEDROXYPROGESTERONE ACETATE 150 MG/ML IM SUSP
150.0000 mg | Freq: Once | INTRAMUSCULAR | Status: AC
  Administered 2021-10-09: 150 mg via INTRAMUSCULAR

## 2021-10-09 NOTE — Progress Notes (Signed)
Date last pap: 12/28/2017. Last Depo-Provera: 07/10/2021. Side Effects if any: None. Serum HCG indicated? N/A. Depo-Provera 150 mg IM given by: Resa Miner, RN into Right Deltoid, no adverse affects reported.Marland Kitchen Next appointment due: 9/1-10/5

## 2021-10-13 ENCOUNTER — Other Ambulatory Visit (HOSPITAL_COMMUNITY): Payer: Self-pay

## 2021-10-21 ENCOUNTER — Ambulatory Visit: Payer: BC Managed Care – PPO | Admitting: Internal Medicine

## 2021-11-12 ENCOUNTER — Other Ambulatory Visit: Payer: Self-pay | Admitting: Internal Medicine

## 2021-11-12 DIAGNOSIS — Z86711 Personal history of pulmonary embolism: Secondary | ICD-10-CM

## 2021-11-13 ENCOUNTER — Other Ambulatory Visit (HOSPITAL_COMMUNITY): Payer: Self-pay

## 2021-11-13 MED ORDER — RIVAROXABAN 20 MG PO TABS
ORAL_TABLET | Freq: Every day | ORAL | 5 refills | Status: DC
  Filled 2021-11-13: qty 30, 30d supply, fill #0
  Filled 2021-12-11: qty 30, 30d supply, fill #1
  Filled 2022-01-07: qty 30, 30d supply, fill #2
  Filled 2022-02-18: qty 30, 30d supply, fill #3
  Filled 2022-03-16: qty 30, 30d supply, fill #4
  Filled 2022-04-15 – 2022-04-16 (×3): qty 30, 30d supply, fill #5

## 2021-11-13 NOTE — Telephone Encounter (Signed)
Requested Prescriptions  Pending Prescriptions Disp Refills  . rivaroxaban (XARELTO) 20 MG TABS tablet 30 tablet 5    Sig: TAKE 1 TABLET (20 MG TOTAL) BY MOUTH DAILY WITH SUPPER.     Hematology: Anticoagulants - rivaroxaban Failed - 11/12/2021 10:05 PM      Failed - Cr in normal range and within 360 days    Creat  Date Value Ref Range Status  04/14/2016 1.09 0.50 - 1.10 mg/dL Final   Creatinine, Ser  Date Value Ref Range Status  08/18/2021 1.09 (H) 0.57 - 1.00 mg/dL Final         Failed - PLT in normal range and within 360 days    Platelets  Date Value Ref Range Status  12/19/2020 474 (H) 150 - 450 x10E3/uL Final         Passed - ALT in normal range and within 360 days    ALT  Date Value Ref Range Status  08/18/2021 19 0 - 32 IU/L Final         Passed - AST in normal range and within 360 days    AST  Date Value Ref Range Status  08/18/2021 18 0 - 40 IU/L Final         Passed - HCT in normal range and within 360 days    Hematocrit  Date Value Ref Range Status  08/18/2021 41.3 34.0 - 46.6 % Final         Passed - HGB in normal range and within 360 days    Hemoglobin  Date Value Ref Range Status  08/18/2021 13.0 11.1 - 15.9 g/dL Final         Passed - eGFR is 15 or above and within 360 days    GFR, Est African American  Date Value Ref Range Status  04/14/2016 77 >=60 mL/min Final   GFR calc Af Amer  Date Value Ref Range Status  04/16/2020 79 >59 mL/min/1.73 Final    Comment:    **In accordance with recommendations from the NKF-ASN Task force,**   [facility] is in the process of updating its eGFR calculation to the   2021 CKD-EPI creatinine equation that estimates kidney function   without a race variable.    GFR, Est Non African American  Date Value Ref Range Status  04/14/2016 67 >=60 mL/min Final   GFR calc non Af Amer  Date Value Ref Range Status  04/16/2020 69 >59 mL/min/1.73 Final   eGFR  Date Value Ref Range Status  08/18/2021 67 >59 mL/min/1.73  Final         Passed - Patient is not pregnant      Passed - Valid encounter within last 12 months    Recent Outpatient Visits          2 months ago Essential hypertension   [place], MD   11 months ago Essential hypertension   [place], MD   1 year ago On continuous oral anticoagulation   [place], Deborah B, MD   1 year ago Influenza vaccine needed   [place], Jarome Matin, RPH-CPP   1 year ago Essential hypertension   [place], Deborah B, MD      Future Appointments            In 1 month Wynetta Emery Dalbert Batman, MD  [place]

## 2021-12-11 ENCOUNTER — Other Ambulatory Visit (HOSPITAL_COMMUNITY): Payer: Self-pay

## 2021-12-11 ENCOUNTER — Ambulatory Visit: Payer: BC Managed Care – PPO | Attending: Internal Medicine

## 2021-12-11 DIAGNOSIS — Z23 Encounter for immunization: Secondary | ICD-10-CM | POA: Diagnosis not present

## 2021-12-25 ENCOUNTER — Other Ambulatory Visit: Payer: Self-pay | Admitting: Internal Medicine

## 2021-12-25 DIAGNOSIS — I10 Essential (primary) hypertension: Secondary | ICD-10-CM

## 2021-12-26 NOTE — Telephone Encounter (Signed)
Refill sent 08/18/2021 was not received by pharmacy. Requested Prescriptions  Pending Prescriptions Disp Refills  . potassium chloride SA (KLOR-CON M) 20 MEQ tablet [Pharmacy Med Name: Potassium Chloride Crys ER 20 MEQ Oral Tablet Extended Release] 90 tablet 2    Sig: Take 1 tablet by mouth once daily     Endocrinology:  Minerals - Potassium Supplementation Failed - 12/25/2021 10:12 PM      Failed - Cr in normal range and within 360 days    Creat  Date Value Ref Range Status  04/14/2016 1.09 0.50 - 1.10 mg/dL Final   Creatinine, Ser  Date Value Ref Range Status  08/18/2021 1.09 (H) 0.57 - 1.00 mg/dL Final         Passed - K in normal range and within 360 days    Potassium  Date Value Ref Range Status  08/18/2021 3.6 3.5 - 5.2 mmol/L Final         Passed - Valid encounter within last 12 months    Recent Outpatient Visits          4 months ago Essential hypertension   [place], Deborah B, MD   1 year ago Essential hypertension   [place], Deborah B, MD   1 year ago On continuous oral anticoagulation   [place], MD   1 year ago Influenza vaccine needed   [place], Jarome Matin, RPH-CPP   1 year ago Essential hypertension   [place], MD      Future Appointments            In 6 days Ladell Pier, MD [place]

## 2021-12-30 ENCOUNTER — Ambulatory Visit: Payer: BC Managed Care – PPO

## 2022-01-01 ENCOUNTER — Ambulatory Visit: Payer: BC Managed Care – PPO | Admitting: Internal Medicine

## 2022-01-01 ENCOUNTER — Ambulatory Visit (INDEPENDENT_AMBULATORY_CARE_PROVIDER_SITE_OTHER): Payer: BC Managed Care – PPO | Admitting: *Deleted

## 2022-01-01 VITALS — BP 129/86 | HR 102

## 2022-01-01 DIAGNOSIS — Z3042 Encounter for surveillance of injectable contraceptive: Secondary | ICD-10-CM

## 2022-01-01 MED ORDER — MEDROXYPROGESTERONE ACETATE 150 MG/ML IM SUSP
150.0000 mg | Freq: Once | INTRAMUSCULAR | Status: AC
  Administered 2022-01-01: 150 mg via INTRAMUSCULAR

## 2022-01-01 NOTE — Progress Notes (Signed)
Date last pap: 12/28/2017 (Annual 03/10/21) Last Depo-Provera: 10/09/21. Side Effects if any: NA. Serum HCG indicated? NA. Depo-Provera 150 mg IM given by: Lillia Abed. Lazarus Salines, RNC in LD Next appointment due 03/19/22-04/02/22.

## 2022-01-02 NOTE — Progress Notes (Signed)
Patient was assessed and managed by nursing staff during this encounter. I have reviewed the chart and agree with the documentation and plan. I have also made any necessary editorial changes.  Sarabi Sockwell A Zeniyah Peaster, MD 01/02/2022 9:25 AM   

## 2022-01-07 ENCOUNTER — Other Ambulatory Visit (HOSPITAL_COMMUNITY): Payer: Self-pay

## 2022-01-15 ENCOUNTER — Other Ambulatory Visit: Payer: Self-pay

## 2022-02-19 ENCOUNTER — Other Ambulatory Visit (HOSPITAL_COMMUNITY): Payer: Self-pay

## 2022-03-16 ENCOUNTER — Other Ambulatory Visit (HOSPITAL_COMMUNITY): Payer: Self-pay

## 2022-03-20 ENCOUNTER — Ambulatory Visit: Payer: BC Managed Care – PPO | Admitting: Internal Medicine

## 2022-03-24 ENCOUNTER — Ambulatory Visit: Payer: BC Managed Care – PPO

## 2022-03-25 ENCOUNTER — Other Ambulatory Visit: Payer: Self-pay | Admitting: *Deleted

## 2022-03-25 NOTE — Progress Notes (Signed)
1 refill Depo sent for 03/27/22 dose. Pt advised of need to schedule annual exam prior to further refills.

## 2022-03-27 ENCOUNTER — Ambulatory Visit: Payer: BC Managed Care – PPO

## 2022-04-02 ENCOUNTER — Ambulatory Visit (INDEPENDENT_AMBULATORY_CARE_PROVIDER_SITE_OTHER): Payer: BC Managed Care – PPO | Admitting: General Practice

## 2022-04-02 VITALS — BP 141/98 | HR 93 | Ht 64.0 in | Wt 326.0 lb

## 2022-04-02 DIAGNOSIS — Z3042 Encounter for surveillance of injectable contraceptive: Secondary | ICD-10-CM | POA: Diagnosis not present

## 2022-04-02 MED ORDER — MEDROXYPROGESTERONE ACETATE 150 MG/ML IM SUSP
150.0000 mg | Freq: Once | INTRAMUSCULAR | Status: AC
  Administered 2022-04-02: 150 mg via INTRAMUSCULAR

## 2022-04-02 NOTE — Progress Notes (Signed)
Patient was assessed and managed by nursing staff during this encounter. I have reviewed the chart and agree with the documentation and plan. I have also made any necessary editorial changes.  Warden Fillers, MD 04/02/2022 1:18 PM

## 2022-04-02 NOTE — Progress Notes (Signed)
Date last pap: 12-28-17. Last Depo-Provera: 01-01-22. Side Effects if any: Pt tolerated well.  Pt provided Depo Serum HCG indicated? NA. Depo-Provera 150 mg IM given by: Hope Pigeon, CMA per pt request. Next appointment due 3/15-3/29.   Pt advised to schedule AEX for Depo refills.

## 2022-04-15 ENCOUNTER — Other Ambulatory Visit (HOSPITAL_COMMUNITY): Payer: Self-pay

## 2022-04-15 ENCOUNTER — Other Ambulatory Visit: Payer: Self-pay

## 2022-04-16 ENCOUNTER — Other Ambulatory Visit (HOSPITAL_COMMUNITY): Payer: Self-pay

## 2022-05-13 ENCOUNTER — Other Ambulatory Visit: Payer: Self-pay | Admitting: Internal Medicine

## 2022-05-13 DIAGNOSIS — Z86711 Personal history of pulmonary embolism: Secondary | ICD-10-CM

## 2022-05-14 ENCOUNTER — Ambulatory Visit: Payer: BC Managed Care – PPO | Admitting: Obstetrics

## 2022-05-14 ENCOUNTER — Other Ambulatory Visit: Payer: Self-pay

## 2022-05-14 ENCOUNTER — Other Ambulatory Visit (HOSPITAL_COMMUNITY): Payer: Self-pay

## 2022-05-14 MED ORDER — RIVAROXABAN 20 MG PO TABS
20.0000 mg | ORAL_TABLET | Freq: Every day | ORAL | 0 refills | Status: DC
  Filled 2022-05-14: qty 30, 30d supply, fill #0

## 2022-05-18 ENCOUNTER — Other Ambulatory Visit: Payer: Self-pay

## 2022-05-18 NOTE — Progress Notes (Signed)
Patient attempted to be outreached by Levi Aland, PharmD Candidate on 05/18/22 to discuss hypertension. Left voicemail for patient to return our call at their convenience at [phone].   Levi Aland, PharmD Candidate, Class of [phone]  [place], [place].D. PGY-2 Ambulatory Care Pharmacy Resident 05/18/2022 1:21 PM

## 2022-05-25 ENCOUNTER — Telehealth: Payer: Self-pay

## 2022-05-25 NOTE — Telephone Encounter (Signed)
Patient attempted to be outreached by Levi Aland, PharmD Candidate on 05/25/22 to discuss hypertension. Left voicemail for patient to return our call at their convenience at [phone].   Levi Aland, PharmD Candidate, Class of [phone]  [place], [place].D. PGY-2 Ambulatory Care Pharmacy Resident 05/25/2022 1:23 PM

## 2022-05-26 ENCOUNTER — Other Ambulatory Visit: Payer: Self-pay

## 2022-05-26 NOTE — Progress Notes (Signed)
Patient outreached by Donney Rankins, PharmD Candidate on 05/26/2022 to discuss hypertension.  Patient does not have an automated home blood pressure machine, but stated that she will work on getting one.  She is compliant with her medications, has no complaints about side effects, and does not need any refills.  She has a PCP visit scheduled for 02/26.  She eats a mix of home-cooked meals and eats out.  She does incorporate protein (chicken, beef), vegetables, and fruits into her diet.  Patient walks on the weekends when she is off for exercise.    Medication review was performed. They are taking medications as prescribed.   The following barriers to adherence were noted:  - They do not have cost concerns.  - They do not have transportation concerns.  - They do not need assistance obtaining refills.  - They do not occasionally forget to take some of their prescribed medications.  - They do not feel like one/some of their medications make them feel poorly.  - They do not have questions or concerns about their medications.  - They do have follow up scheduled with their primary care provider/cardiologist.   The following interventions were completed:  - Medications were reviewed  - Patient was educated on goal blood pressures and long term health implications of elevated blood pressure  - Patient was educated on proper technique to check home blood pressure and reminded to bring home machine and readings to next provider appointment  - Patient was educated on how to access home blood pressure machine  - Patient was counseled on lifestyle modifications to improve blood pressure, including regular physical activity   The patient has follow up scheduled:  PCP: [facility]  PharmD Candidate 2024    Maryan Puls, PharmD PGY-1 [facility] Pharmacy Resident

## 2022-06-01 ENCOUNTER — Other Ambulatory Visit (HOSPITAL_COMMUNITY): Payer: Self-pay

## 2022-06-01 ENCOUNTER — Encounter: Payer: Self-pay | Admitting: Internal Medicine

## 2022-06-01 ENCOUNTER — Ambulatory Visit: Payer: BC Managed Care – PPO | Attending: Internal Medicine | Admitting: Internal Medicine

## 2022-06-01 VITALS — BP 110/76 | HR 103 | Temp 98.2°F | Ht 64.0 in | Wt 324.0 lb

## 2022-06-01 DIAGNOSIS — Z6841 Body Mass Index (BMI) 40.0 and over, adult: Secondary | ICD-10-CM

## 2022-06-01 DIAGNOSIS — I10 Essential (primary) hypertension: Secondary | ICD-10-CM

## 2022-06-01 DIAGNOSIS — Z86711 Personal history of pulmonary embolism: Secondary | ICD-10-CM | POA: Diagnosis not present

## 2022-06-01 MED ORDER — POTASSIUM CHLORIDE CRYS ER 20 MEQ PO TBCR: 20.0000 meq | EXTENDED_RELEASE_TABLET | Freq: Every day | ORAL | 2 refills | Status: DC

## 2022-06-01 MED ORDER — HYDROCHLOROTHIAZIDE 25 MG PO TABS: 25.0000 mg | ORAL_TABLET | Freq: Every day | ORAL | 1 refills | Status: DC

## 2022-06-01 MED ORDER — WEGOVY 0.25 MG/0.5ML ~~LOC~~ SOAJ: 0.2500 mg | SUBCUTANEOUS | 1 refills | Status: DC

## 2022-06-01 MED ORDER — RIVAROXABAN 20 MG PO TABS
20.0000 mg | ORAL_TABLET | Freq: Every day | ORAL | 1 refills | Status: DC
  Filled 2022-06-01 – 2022-06-06 (×2): qty 90, 90d supply, fill #0

## 2022-06-01 NOTE — Progress Notes (Signed)
Patient ID: Karen Robles, female    DOB: Feb 12, 1981  MRN: [id number]  CC: Hypertension (HTN f/u. Med refill. Janene Harvey on L foot/heel X3 yrs)   Subjective: Karen Robles is a 42 y.o. female who presents for chronic ds management Her concerns today include:  Pt with hx of PE in 2015 unprovoked, pre-DM, HTN, COVID infection 05/2019 and morbid obesity.   HYPERTENSION Currently taking: see medication list.  She is on HCTZ 25 mg daily.  She is taking the potassium supplement with it. Med Adherence: '[x]'$  Yes    '[]'$  No Medication side effects: '[]'$  Yes    '[x]'$  No Adherence with salt restriction: '[x]'$  Yes    '[]'$  No Home Monitoring?: '[]'$  Yes    '[x]'$  No Monitoring Frequency:  Home BP results range:  SOB? '[]'$  Yes    '[x]'$  No Chest Pain?: '[]'$  Yes    '[x]'$  No Leg swelling?: '[]'$  Yes    '[x]'$  No Headaches?: '[]'$  Yes    '[x]'$  No Dizziness? '[]'$  Yes    '[x]'$  No Comments:   Hx of PE:  taking and tolerating Xarelto.  No bruising or bleeding.  Obesity:  down 6 lbs since last visit 08/2021.  Feels she is doing better with eating habits.  Gets in 3 meals a day.  Snack on fruits.  Goes up and down the stairs in her apartment about 3 x a day.  Does stretching exercises in bed.  Works at [employer].  Last 15 mins of lunch break she walks around store.  She would like to be tried with [facility] or Ozempic to assist with weight loss.   Patient Active Problem List   Diagnosis Date Noted   Thrombocytosis 08/18/2021   Leukocytosis 08/18/2021   Influenza vaccine needed 04/16/2020   COVID-19 vaccine series completed 04/16/2020   History of pulmonary embolism 07/22/2017   Surveillance of contraceptive injection 06/11/2016   Severe obesity (BMI >= 40) ([place]) 07/26/2013   Essential hypertension 03/07/2013     Current Outpatient Medications on File Prior to Visit  Medication Sig Dispense Refill   hydrochlorothiazide (HYDRODIURIL) 25 MG tablet Take 1 tablet by mouth once daily 90 tablet 3   medroxyPROGESTERone Acetate 150 MG/ML SUSY  Inject 1 mL (150 mg total) into the muscle every 3 (three) months. 0.9 mL 3   potassium chloride SA (KLOR-CON M) 20 MEQ tablet Take 1 tablet by mouth once daily 90 tablet 2   rivaroxaban (XARELTO) 20 MG TABS tablet Take 1 tablet (20 mg total) by mouth daily with supper. 30 tablet 0   No current facility-administered medications on file prior to visit.    No Known Allergies  Social History   Socioeconomic History   Marital status: Single    Spouse name: Not on file   Number of children: 2   Years of education: Not on file   Highest education level: Not on file  Occupational History   Occupation: Unemployed  Tobacco Use   Smoking status: Never   Smokeless tobacco: Never  Vaping Use   Vaping Use: Never used  Substance and Sexual Activity   Alcohol use: No    Alcohol/week: 0.0 standard drinks of alcohol   Drug use: No   Sexual activity: Yes    Partners: Male    Birth control/protection: Injection  Other Topics Concern   Not on file  Social History Narrative   Not on file   Social Determinants of Health   Financial Resource Strain: Not on file  Food  Insecurity: Not on file  Transportation Needs: Not on file  Physical Activity: Not on file  Stress: Not on file  Social Connections: Not on file  Intimate Partner Violence: Not on file    Family History  Problem Relation Age of Onset   Hypertension Mother        maternal aunts & uncles   Hyperlipidemia Mother    Healthy Brother    Hypertension Maternal Aunt    Hypertension Maternal Uncle    Cancer Maternal Grandmother    Diabetes Neg Hx    Heart disease Neg Hx     Past Surgical History:  Procedure Laterality Date   MOUTH SURGERY     WISDOM TOOTH EXTRACTION Bilateral     ROS: Review of Systems Negative except as stated above  PHYSICAL EXAM: BP 110/76 (BP Location: Left Arm, Patient Position: Sitting, Cuff Size: Large)   Pulse (!) 103   Temp 98.2 F (36.8 C) (Oral)   Ht '5\' 4"'$  (1.626 m)   Wt (!) 324 lb  (147 kg)   BMI 55.61 kg/m   Wt Readings from Last 3 Encounters:  06/01/22 (!) 324 lb (147 kg)  04/02/22 (!) 326 lb (147.9 kg)  10/09/21 (!) 330 lb (149.7 kg)    Physical Exam  General appearance - alert, well appearing, and in no distress Mental status - normal mood, behavior, speech, dress, motor activity, and thought processes Chest - clear to auscultation, no wheezes, rales or rhonchi, symmetric air entry Heart - normal rate, regular rhythm, normal S1, S2, no murmurs, rubs, clicks or gallops Extremities - peripheral pulses normal, no pedal edema, no clubbing or cyanosis      Latest Ref Rng & Units 08/18/2021    9:15 AM 12/19/2020   11:34 AM 08/27/2020    4:14 PM  CMP  Glucose 70 - 99 mg/dL 85  120    BUN 6 - 20 mg/dL 23  19    Creatinine 0.57 - 1.00 mg/dL 1.09  1.00    Sodium 134 - 144 mmol/L 138  144    Potassium 3.5 - 5.2 mmol/L 3.6  3.4  3.1   Chloride 96 - 106 mmol/L 102  104    CO2 20 - 29 mmol/L 23  25    Calcium 8.7 - 10.2 mg/dL 9.1  9.0    Total Protein 6.0 - 8.5 g/dL 7.5  7.1    Total Bilirubin 0.0 - 1.2 mg/dL <0.2  0.3    Alkaline Phos 44 - 121 IU/L 84  81    AST 0 - 40 IU/L 18  16    ALT 0 - 32 IU/L 19  15     Lipid Panel     Component Value Date/Time   CHOL 146 12/19/2020 1134   TRIG 84 12/19/2020 1134   HDL 35 (L) 12/19/2020 1134   CHOLHDL 4.2 12/19/2020 1134   CHOLHDL 3.9 09/05/2013 1148   VLDL 24 09/05/2013 1148   LDLCALC 95 12/19/2020 1134    CBC    Component Value Date/Time   WBC 12.4 (H) 08/18/2021 0915   WBC 9.3 04/14/2016 0918   RBC 5.05 08/18/2021 0915   RBC 4.82 04/14/2016 0918   HGB 13.0 08/18/2021 0915   HCT 41.3 08/18/2021 0915   PLT 474 (H) 12/19/2020 1134   MCV 82 08/18/2021 0915   MCH 25.7 (L) 08/18/2021 0915   MCH 26.6 (L) 04/14/2016 0918   MCHC 31.5 08/18/2021 0915   MCHC 32.7 04/14/2016 [id number]  RDW 13.6 08/18/2021 0915   LYMPHSABS 3.6 (H) 08/18/2021 0915   MONOABS 279 04/14/2016 0918   EOSABS 0.3 08/18/2021 0915    BASOSABS 0.1 08/18/2021 0915    ASSESSMENT AND PLAN:  1. Essential hypertension At goal.  Continue HCTZ. - Comprehensive metabolic panel - Lipid panel - CBC - potassium chloride SA (KLOR-CON M) 20 MEQ tablet; Take 1 tablet (20 mEq total) by mouth daily.  Dispense: 90 tablet; Refill: 2 - hydrochlorothiazide (HYDRODIURIL) 25 MG tablet; Take 1 tablet (25 mg total) by mouth daily.  Dispense: 90 tablet; Refill: 1  2. Morbid obesity with BMI of 50.0-59.9, adult [facility]) Discussed and encourage healthy eating habits and regular exercise.  Commended her on trying to move more.  We discussed starting her on Wegovy.  I went over with her how the medication works and possible side effects.  Side effects that were reviewed include nausea, vomiting, diarrhea, pancreatitis, bowel obstruction.  No history of medullary thyroid cancer.  Advised patient to stop the medicine if she develops vomiting, significant diarrhea or pain in the upper abdomen.  Start on low-dose 0.25 mg once a week.  If she tolerates it, after 1 month we can increase the dose to 0.5 mg.  Clinical pharmacist met with patient today to be taught administration. - Hemoglobin A1c - Semaglutide-Weight Management (WEGOVY) 0.25 MG/0.5ML SOAJ; Inject 0.25 mg into the skin once a week.  Dispense: 2 mL; Refill: 1  3. History of pulmonary embolism Tolerating Xarelto without any bruising or bleeding.  Refill given. - rivaroxaban (XARELTO) 20 MG TABS tablet; Take 1 tablet (20 mg total) by mouth daily with supper.  Dispense: 90 tablet; Refill: 1    Patient was given the opportunity to ask questions.  Patient verbalized understanding of the plan and was able to repeat key elements of the plan.   This documentation was completed using Radio producer.  Any transcriptional errors are unintentional.  No orders of the defined types were placed in this encounter.    Requested Prescriptions    No prescriptions requested or ordered  in this encounter    No follow-ups on file.  Karle Plumber, MD, FACP

## 2022-06-01 NOTE — Patient Instructions (Signed)
Semaglutide Injection (Weight Management) What is this medication? SEMAGLUTIDE (SEM a GLOO tide) promotes weight loss. It may also be used to maintain weight loss. It works by decreasing appetite. Changes to diet and exercise are often combined with this medication. This medicine may be used for other purposes; ask your health care provider or pharmacist if you have questions. COMMON BRAND NAME(S): [id number] What should I tell my care team before I take this medication? They need to know if you have any of these conditions: Endocrine tumors (MEN 2) or if someone in your family had these tumors Eye disease, vision problems Gallbladder disease History of depression or mental health disease History of pancreatitis Kidney disease Stomach or intestine problems Suicidal thoughts, plans, or attempt; a previous suicide attempt by you or a family member Thyroid cancer or if someone in your family had thyroid cancer An unusual or allergic reaction to semaglutide, other medications, foods, dyes, or preservatives Pregnant or trying to get pregnant Breast-feeding How should I use this medication? This medication is injected under the skin. You will be taught how to prepare and give it. Take it as directed on the prescription label. It is given once every week (every 7 days). Keep taking it unless your care team tells you to stop. It is important that you put your used needles and pens in a special sharps container. Do not put them in a trash can. If you do not have a sharps container, call your pharmacist or care team to get one. A special MedGuide will be given to you by the pharmacist with each prescription and refill. Be sure to read this information carefully each time. This medication comes with INSTRUCTIONS FOR USE. Ask your pharmacist for directions on how to use this medication. Read the information carefully. Talk to your pharmacist or care team if you have questions. Talk to your care team about  the use of this medication in children. While it may be prescribed for children as young as 12 years for selected conditions, precautions do apply. Overdosage: If you think you have taken too much of this medicine contact a poison control center or emergency room at once. NOTE: This medicine is only for you. Do not share this medicine with others. What if I miss a dose? If you miss a dose and the next scheduled dose is more than 2 days away, take the missed dose as soon as possible. If you miss a dose and the next scheduled dose is less than 2 days away, do not take the missed dose. Take the next dose at your regular time. Do not take double or extra doses. If you miss your dose for 2 weeks or more, take the next dose at your regular time or call your care team to talk about how to restart this medication. What may interact with this medication? Insulin and other medications for diabetes This list may not describe all possible interactions. Give your health care provider a list of all the medicines, herbs, non-prescription drugs, or dietary supplements you use. Also tell them if you smoke, drink alcohol, or use illegal drugs. Some items may interact with your medicine. What should I watch for while using this medication? Visit your care team for regular checks on your progress. It may be some time before you see the benefit from this medication. Drink plenty of fluids while taking this medication. Check with your care team if you have severe diarrhea, nausea, and vomiting, or if you sweat a  lot. The loss of too much body fluid may make it dangerous for you to take this medication. This medication may affect blood sugar levels. Ask your care team if changes in diet or medications are needed if you have diabetes. If you or your family notice any changes in your behavior, such as new or worsening depression, thoughts of harming yourself, anxiety, other unusual or disturbing thoughts, or memory loss, call  your care team right away. Women should inform their care team if they wish to become pregnant or think they might be pregnant. Losing weight while pregnant is not advised and may cause harm to the unborn child. Talk to your care team for more information. What side effects may I notice from receiving this medication? Side effects that you should report to your care team as soon as possible: Allergic reactions--skin rash, itching, hives, swelling of the face, lips, tongue, or throat Change in vision Dehydration--increased thirst, dry mouth, feeling faint or lightheaded, headache, dark yellow or brown urine Gallbladder problems--severe stomach pain, nausea, vomiting, fever Heart palpitations--rapid, pounding, or irregular heartbeat Kidney injury--decrease in the amount of urine, swelling of the ankles, hands, or feet Pancreatitis--severe stomach pain that spreads to your back or gets worse after eating or when touched, fever, nausea, vomiting Thoughts of suicide or self-harm, worsening mood, feelings of depression Thyroid cancer--new mass or lump in the neck, pain or trouble swallowing, trouble breathing, hoarseness Side effects that usually do not require medical attention (report to your care team if they continue or are bothersome): Diarrhea Loss of appetite Nausea Stomach pain Vomiting This list may not describe all possible side effects. Call your doctor for medical advice about side effects. You may report side effects to FDA at 1-800-FDA-1088. Where should I keep my medication? Keep out of the reach of children and pets. Refrigeration (preferred): Store in the refrigerator. Do not freeze. Keep this medication in the original container until you are ready to take it. Get rid of any unused medication after the expiration date. Room temperature: If needed, prior to cap removal, the pen can be stored at room temperature for up to 28 days. Protect from light. If it is stored at room  temperature, get rid of any unused medication after 28 days or after it expires, whichever is first. It is important to get rid of the medication as soon as you no longer need it or it is expired. You can do this in two ways: Take the medication to a medication take-back program. Check with your pharmacy or law enforcement to find a location. If you cannot return the medication, follow the directions in the [place]. NOTE: This sheet is a summary. It may not cover all possible information. If you have questions about this medicine, talk to your doctor, pharmacist, or health care provider.  2023 Elsevier/Gold Standard (2020-06-27 00:00:00)

## 2022-06-02 ENCOUNTER — Other Ambulatory Visit: Payer: Self-pay

## 2022-06-02 ENCOUNTER — Telehealth: Payer: Self-pay

## 2022-06-02 ENCOUNTER — Encounter: Payer: Self-pay | Admitting: Internal Medicine

## 2022-06-02 LAB — COMPREHENSIVE METABOLIC PANEL
ALT: 17 IU/L (ref 0–32)
AST: 14 IU/L (ref 0–40)
Albumin/Globulin Ratio: 1 — ABNORMAL LOW (ref 1.2–2.2)
Albumin: 4 g/dL (ref 3.9–4.9)
Alkaline Phosphatase: 95 IU/L (ref 44–121)
BUN/Creatinine Ratio: 15 (ref 9–23)
BUN: 17 mg/dL (ref 6–20)
Bilirubin Total: <0.2 mg/dL (ref 0.0–1.2)
CO2: 22 mmol/L (ref 20–29)
Calcium: 9.6 mg/dL (ref 8.7–10.2)
Chloride: 102 mmol/L (ref 96–106)
Creatinine, Ser: 1.1 mg/dL — ABNORMAL HIGH (ref 0.57–1.00)
Globulin, Total: 4.1 g/dL (ref 1.5–4.5)
Glucose: 78 mg/dL (ref 70–99)
Potassium: 3.9 mmol/L (ref 3.5–5.2)
Sodium: 141 mmol/L (ref 134–144)
Total Protein: 8.1 g/dL (ref 6.0–8.5)
eGFR: 66 mL/min/{1.73_m2} (ref >59)

## 2022-06-02 LAB — CBC
Hematocrit: 43 % (ref 34.0–46.6)
Hemoglobin: 13.7 g/dL (ref 11.1–15.9)
MCH: 25.8 pg — ABNORMAL LOW (ref 26.6–33.0)
MCHC: 31.9 g/dL (ref 31.5–35.7)
MCV: 81 fL (ref 79–97)
Platelets: 491 10*3/uL — ABNORMAL HIGH (ref 150–450)
RBC: 5.32 x10E6/uL — ABNORMAL HIGH (ref 3.77–5.28)
RDW: 14 % (ref 11.7–15.4)
WBC: 13.5 10*3/uL — ABNORMAL HIGH (ref 3.4–10.8)

## 2022-06-02 LAB — LIPID PANEL
Chol/HDL Ratio: 4.4 ratio (ref 0.0–4.4)
Cholesterol, Total: 158 mg/dL (ref 100–199)
HDL: 36 mg/dL — ABNORMAL LOW (ref >39)
LDL Chol Calc (NIH): 101 mg/dL — ABNORMAL HIGH (ref 0–99)
Triglycerides: 113 mg/dL (ref 0–149)
VLDL Cholesterol Cal: 21 mg/dL (ref 5–40)

## 2022-06-02 LAB — HEMOGLOBIN A1C
Est. average glucose Bld gHb Est-mCnc: 120 mg/dL
Hgb A1c MFr Bld: 5.8 % — ABNORMAL HIGH (ref 4.8–5.6)

## 2022-06-02 NOTE — Telephone Encounter (Signed)
[facility] prior authorization submitted to patient's ins today via CoverMyMeds Key: [id number]. PA was denied: Weight loss treatments, including but not limited to medications, diet supplements, and surgeries outside the scope of the [facility] program, are not covered.

## 2022-06-03 ENCOUNTER — Other Ambulatory Visit: Payer: Self-pay | Admitting: *Deleted

## 2022-06-03 ENCOUNTER — Other Ambulatory Visit: Payer: Self-pay

## 2022-06-03 ENCOUNTER — Other Ambulatory Visit: Payer: Self-pay | Admitting: Internal Medicine

## 2022-06-03 DIAGNOSIS — Z01419 Encounter for gynecological examination (general) (routine) without abnormal findings: Secondary | ICD-10-CM

## 2022-06-03 MED ORDER — WEGOVY 0.25 MG/0.5ML ~~LOC~~ SOAJ: 0.2500 mg | SUBCUTANEOUS | 1 refills | Status: DC

## 2022-06-03 MED ORDER — MEDROXYPROGESTERONE ACETATE 150 MG/ML IM SUSY: 1.0000 mL | PREFILLED_SYRINGE | INTRAMUSCULAR | 0 refills | Status: DC

## 2022-06-03 MED ORDER — MEDROXYPROGESTERONE ACETATE 150 MG/ML IM SUSP: 150.0000 mg | INTRAMUSCULAR | 0 refills | Status: DC

## 2022-06-03 NOTE — Progress Notes (Signed)
Original e prescription depo: transmission failed X 3. RX called in to pharmacy.

## 2022-06-04 ENCOUNTER — Telehealth: Payer: Self-pay

## 2022-06-04 DIAGNOSIS — D75839 Thrombocytosis, unspecified: Secondary | ICD-10-CM

## 2022-06-04 DIAGNOSIS — D72829 Elevated white blood cell count, unspecified: Secondary | ICD-10-CM

## 2022-06-04 NOTE — Telephone Encounter (Signed)
Patient would like referral placed.

## 2022-06-04 NOTE — Telephone Encounter (Signed)
-----   Message from Ladell Pier, MD sent at 06/02/2022  7:24 AM EST ----- Let patient know she is still in the range for prediabetes.  Healthy eating habits and regular exercise will prevent progression to full diabetes.  Hopefully she would be approved for the medication Wegovy which we prescribed yesterday. Kidney function not 100% but stable compared to almost a year ago. Cholesterol level mildly elevated at 101 with goal being less than 100. She has persistent elevation in her white blood cell count and platelet cell count.  I would like to refer her to a hematologist to evaluate this further.  She may have an underlying blood disorder.  We had tried referring her in the past but she never kept the appointment.  Please let me know if she is willing to see the specialist.

## 2022-06-08 ENCOUNTER — Other Ambulatory Visit: Payer: Self-pay

## 2022-06-09 ENCOUNTER — Telehealth: Payer: Self-pay | Admitting: Oncology

## 2022-06-09 NOTE — Telephone Encounter (Signed)
Scheduled appt per 3/4 referral. Pt is aware of appt date and time. Pt is aware to arrive 15 mins prior to appt time and to bring and updated insurance card. Pt is aware of appt location.

## 2022-06-22 ENCOUNTER — Ambulatory Visit: Payer: BC Managed Care – PPO

## 2022-06-25 ENCOUNTER — Ambulatory Visit (INDEPENDENT_AMBULATORY_CARE_PROVIDER_SITE_OTHER): Payer: BC Managed Care – PPO

## 2022-06-25 VITALS — BP 143/87 | HR 93 | Ht 64.0 in | Wt 326.4 lb

## 2022-06-25 DIAGNOSIS — Z3042 Encounter for surveillance of injectable contraceptive: Secondary | ICD-10-CM | POA: Diagnosis not present

## 2022-06-25 MED ORDER — MEDROXYPROGESTERONE ACETATE 150 MG/ML IM SUSP
150.0000 mg | Freq: Once | INTRAMUSCULAR | Status: AC
  Administered 2022-06-25: 150 mg via INTRAMUSCULAR

## 2022-06-25 NOTE — Progress Notes (Signed)
Date last pap: 12/28/17. Last Depo-Provera: 04/02/22. Side Effects if any: Pt tolerated well. Serum HCG indicated? N/A. Depo-Provera 150 mg IM given by: Ashden Sonnenberg, LPN in left deltoid. Next appointment due 6/6-6/20.

## 2022-07-03 ENCOUNTER — Encounter: Payer: Self-pay | Admitting: Oncology

## 2022-07-03 ENCOUNTER — Other Ambulatory Visit: Payer: Self-pay

## 2022-07-03 ENCOUNTER — Inpatient Hospital Stay: Payer: BC Managed Care – PPO | Attending: Oncology | Admitting: Oncology

## 2022-07-03 ENCOUNTER — Inpatient Hospital Stay: Payer: BC Managed Care – PPO

## 2022-07-03 VITALS — BP 136/96 | HR 95 | Temp 98.2°F | Resp 18 | Ht 64.0 in | Wt 327.0 lb

## 2022-07-03 DIAGNOSIS — I10 Essential (primary) hypertension: Secondary | ICD-10-CM | POA: Diagnosis not present

## 2022-07-03 DIAGNOSIS — Z86711 Personal history of pulmonary embolism: Secondary | ICD-10-CM

## 2022-07-03 DIAGNOSIS — D72828 Other elevated white blood cell count: Secondary | ICD-10-CM

## 2022-07-03 DIAGNOSIS — Z7901 Long term (current) use of anticoagulants: Secondary | ICD-10-CM | POA: Diagnosis not present

## 2022-07-03 DIAGNOSIS — D72829 Elevated white blood cell count, unspecified: Secondary | ICD-10-CM

## 2022-07-03 DIAGNOSIS — E669 Obesity, unspecified: Secondary | ICD-10-CM | POA: Insufficient documentation

## 2022-07-03 DIAGNOSIS — Z809 Family history of malignant neoplasm, unspecified: Secondary | ICD-10-CM | POA: Insufficient documentation

## 2022-07-03 LAB — PROTIME-INR
INR: 1.6 — ABNORMAL HIGH (ref 0.8–1.2)
Prothrombin Time: 18.8 seconds — ABNORMAL HIGH (ref 11.4–15.2)

## 2022-07-03 LAB — CBC WITH DIFFERENTIAL (CANCER CENTER ONLY)
Abs Immature Granulocytes: 0.05 10*3/uL (ref 0.00–0.07)
Basophils Absolute: 0.1 10*3/uL (ref 0.0–0.1)
Basophils Relative: 1 %
Eosinophils Absolute: 0.2 10*3/uL (ref 0.0–0.5)
Eosinophils Relative: 2 %
HCT: 38.2 % (ref 36.0–46.0)
Hemoglobin: 12.3 g/dL (ref 12.0–15.0)
Immature Granulocytes: 0 %
Lymphocytes Relative: 35 %
Lymphs Abs: 4.3 10*3/uL — ABNORMAL HIGH (ref 0.7–4.0)
MCH: 26.3 pg (ref 26.0–34.0)
MCHC: 32.2 g/dL (ref 30.0–36.0)
MCV: 81.6 fL (ref 80.0–100.0)
Monocytes Absolute: 0.5 10*3/uL (ref 0.1–1.0)
Monocytes Relative: 4 %
Neutro Abs: 7 10*3/uL (ref 1.7–7.7)
Neutrophils Relative %: 58 %
Platelet Count: 457 10*3/uL — ABNORMAL HIGH (ref 150–400)
RBC: 4.68 MIL/uL (ref 3.87–5.11)
RDW: 14.6 % (ref 11.5–15.5)
WBC Count: 12.2 10*3/uL — ABNORMAL HIGH (ref 4.0–10.5)
nRBC: 0 % (ref 0.0–0.2)

## 2022-07-03 LAB — CMP (CANCER CENTER ONLY)
ALT: 14 U/L (ref 0–44)
AST: 14 U/L — ABNORMAL LOW (ref 15–41)
Albumin: 3.6 g/dL (ref 3.5–5.0)
Alkaline Phosphatase: 67 U/L (ref 38–126)
Anion gap: 6 (ref 5–15)
BUN: 20 mg/dL (ref 6–20)
CO2: 29 mmol/L (ref 22–32)
Calcium: 9 mg/dL (ref 8.9–10.3)
Chloride: 103 mmol/L (ref 98–111)
Creatinine: 1.01 mg/dL — ABNORMAL HIGH (ref 0.44–1.00)
GFR, Estimated: >60 mL/min (ref >60)
Glucose, Bld: 91 mg/dL (ref 70–99)
Potassium: 2.8 mmol/L — ABNORMAL LOW (ref 3.5–5.1)
Sodium: 138 mmol/L (ref 135–145)
Total Bilirubin: 0.3 mg/dL (ref 0.3–1.2)
Total Protein: 7.9 g/dL (ref 6.5–8.1)

## 2022-07-03 LAB — D-DIMER, QUANTITATIVE: D-Dimer, Quant: <0.27 ug/mL-FEU (ref 0.00–0.50)

## 2022-07-03 LAB — FIBRINOGEN: Fibrinogen: 678 mg/dL — ABNORMAL HIGH (ref 210–475)

## 2022-07-03 LAB — FERRITIN: Ferritin: 48 ng/mL (ref 11–307)

## 2022-07-03 LAB — APTT: aPTT: 36 seconds (ref 24–36)

## 2022-07-03 NOTE — Progress Notes (Signed)
[facility] Cancer Initial Visit:  Patient Care Team: Marcine Matar, MD as PCP - General (Internal Medicine)  CHIEF COMPLAINTS/PURPOSE OF CONSULTATION:  HISTORY OF PRESENTING ILLNESS: Karen Robles 42 y.o. female is here because of history of VTE Medical history notable for hypertension, obesity  July 07, 2013: CTPA showed several small left lower lobe pulmonary emboli, nonobstructing.  June 01, 2022 WBC 13.5 hemoglobin 13.7 platelet count 491  July 03 2022:  [facility] Hematology Consult Patient states that had a sudden syncopal episode on the day prior to the diagnosis of PE in 2015.  She was on OCP's at the time of dx and weight was similar to what it is now.  Patient was treated with Xarelto which she has remained on since that event.   She is G2 P2.  No history of VTE related to pregnancy.  Her children are ages 33 and 61.  Children were delivered by [facility]. Patient has been on Depot Provera since May 2012 for birth control.   She is not interested in having more children and would consider tubal ligation.    Social:  Single.  Works at [employer].  No tobacco.  EtOH none  FMH Mother alive 79 HTN Father estranged Brother alive late 96's asthma   Review of Systems  Constitutional:  Negative for appetite change, chills, fatigue, fever and unexpected weight change.  HENT:   Negative for mouth sores, nosebleeds and sore throat.   Eyes:  Negative for eye problems and icterus.       Vision changes:  None  Respiratory:  Negative for chest tightness, cough, hemoptysis and wheezing.        DOE walking up stairs  Cardiovascular:  Negative for chest pain, leg swelling and palpitations.       PND:  none Orthopnea:  none  Gastrointestinal:  Negative for abdominal pain, blood in stool, constipation, diarrhea, nausea and vomiting.  Endocrine: Negative for hot flashes.       Cold intolerance:  none Heat intolerance:  none  Genitourinary:  Negative for bladder  incontinence, difficulty urinating, dysuria, frequency, hematuria and nocturia.   Musculoskeletal:  Negative for back pain, gait problem, myalgias, neck pain and neck stiffness.       Arthralgias left foot  Skin:  Negative for itching, rash and wound.  Neurological:  Negative for dizziness, gait problem, headaches, light-headedness and numbness.  Hematological:  Negative for adenopathy. Does not bruise/bleed easily.  Psychiatric/Behavioral:  Negative for sleep disturbance and suicidal ideas. The patient is not nervous/anxious.     MEDICAL HISTORY: Past Medical History:  Diagnosis Date   Hypertension    Obesity    Pulmonary embolism (HCC)     SURGICAL HISTORY: Past Surgical History:  Procedure Laterality Date   MOUTH SURGERY     WISDOM TOOTH EXTRACTION Bilateral     SOCIAL HISTORY: Social History   Socioeconomic History   Marital status: Single    Spouse name: Not on file   Number of children: 2   Years of education: Not on file   Highest education level: Not on file  Occupational History   Occupation: Unemployed  Tobacco Use   Smoking status: Never   Smokeless tobacco: Never  Vaping Use   Vaping Use: Never used  Substance and Sexual Activity   Alcohol use: No    Alcohol/week: 0.0 standard drinks of alcohol   Drug use: No   Sexual activity: Yes    Partners: Male  Birth control/protection: Injection  Other Topics Concern   Not on file  Social History Narrative   Not on file   Social Determinants of Health   Financial Resource Strain: Not on file  Food Insecurity: No Food Insecurity (07/03/2022)   Hunger Vital Sign    Worried About Running Out of Food in the Last Year: Never true    Ran Out of Food in the Last Year: Never true  Transportation Needs: No Transportation Needs (07/03/2022)   PRAPARE - Administrator, Civil Service (Medical): No    Lack of Transportation (Non-Medical): No  Physical Activity: Not on file  Stress: Not on file  Social  Connections: Not on file  Intimate Partner Violence: Not At Risk (07/03/2022)   Humiliation, Afraid, Rape, and Kick questionnaire    Fear of Current or Ex-Partner: No    Emotionally Abused: No    Physically Abused: No    Sexually Abused: No    FAMILY HISTORY Family History  Problem Relation Age of Onset   Hypertension Mother        maternal aunts & uncles   Hyperlipidemia Mother    Healthy Brother    Hypertension Maternal Aunt    Hypertension Maternal Uncle    Cancer Maternal Grandmother    Diabetes Neg Hx    Heart disease Neg Hx     ALLERGIES:  has No Known Allergies.  MEDICATIONS:  Current Outpatient Medications  Medication Sig Dispense Refill   hydrochlorothiazide (HYDRODIURIL) 25 MG tablet Take 1 tablet (25 mg total) by mouth daily. 90 tablet 1   medroxyPROGESTERone (DEPO-PROVERA) 150 MG/ML injection Inject 1 mL (150 mg total) into the muscle every 3 (three) months. 1 mL 0   potassium chloride SA (KLOR-CON M) 20 MEQ tablet Take 2 tablets (40 mEq total) by mouth daily. 180 tablet 2   Rivaroxaban (XARELTO) 15 MG TABS tablet Take 1 tablet (15 mg total) by mouth daily. 30 tablet 3   No current facility-administered medications for this visit.    PHYSICAL EXAMINATION:  ECOG PERFORMANCE STATUS: 0 - Asymptomatic   Vitals:   07/03/22 1138  BP: (!) 136/96  Pulse: 95  Resp: 18  Temp: 98.2 F (36.8 C)  SpO2: 100%    Filed Weights   07/03/22 1138  Weight: (!) 327 lb (148.3 kg)     Physical Exam Vitals and nursing note reviewed.  Constitutional:      General: She is not in acute distress.    Appearance: Normal appearance. She is obese. She is not ill-appearing, toxic-appearing or diaphoretic.  HENT:     Head: Normocephalic and atraumatic.     Right Ear: External ear normal.     Left Ear: External ear normal.     Nose: Nose normal. No congestion or rhinorrhea.  Eyes:     General: No scleral icterus.    Extraocular Movements: Extraocular movements intact.      Conjunctiva/sclera: Conjunctivae normal.     Pupils: Pupils are equal, round, and reactive to light.  Cardiovascular:     Rate and Rhythm: Normal rate and regular rhythm.     Heart sounds: No murmur heard.    No friction rub. No gallop.  Pulmonary:     Effort: Pulmonary effort is normal. No respiratory distress.     Breath sounds: Normal breath sounds. No stridor.  Abdominal:     General: Bowel sounds are normal.     Palpations: Abdomen is soft.  Musculoskeletal:  General: No swelling, tenderness or deformity.     Cervical back: Normal range of motion and neck supple. No rigidity or tenderness.  Lymphadenopathy:     Head:     Right side of head: No submental, submandibular, tonsillar, preauricular, posterior auricular or occipital adenopathy.     Left side of head: No submental, submandibular, tonsillar, preauricular, posterior auricular or occipital adenopathy.     Cervical: No cervical adenopathy.     Right cervical: No superficial, deep or posterior cervical adenopathy.    Left cervical: No superficial, deep or posterior cervical adenopathy.     Upper Body:     Right upper body: No supraclavicular, axillary, pectoral or epitrochlear adenopathy.     Left upper body: No supraclavicular, axillary, pectoral or epitrochlear adenopathy.  Skin:    General: Skin is warm.     Coloration: Skin is not jaundiced or pale.  Neurological:     General: No focal deficit present.     Mental Status: She is alert and oriented to person, place, and time.     Cranial Nerves: No cranial nerve deficit.  Psychiatric:        Mood and Affect: Mood normal.        Behavior: Behavior normal.        Thought Content: Thought content normal.        Judgment: Judgment normal.      LABORATORY DATA: I have personally reviewed the data as listed:  Appointment on 07/03/2022  Component Date Value Ref Range Status   Fibrinogen 07/03/2022 678 (H)  210 - 475 mg/dL Final   Comment: (NOTE) Fibrinogen  results may be underestimated in patients receiving thrombolytic therapy. Performed at [facility], [address] [place], [place] [address]    D-Dimer, Sharene Butters 07/03/2022 <0.27  0.00 - 0.50 ug/mL-FEU Final   Comment: (NOTE) At the manufacturer cut-off value of 0.5 g/mL FEU, this assay has a negative predictive value of 95-100%.This assay is intended for use in conjunction with a clinical pretest probability (PTP) assessment model to exclude pulmonary embolism (PE) and deep venous thrombosis (DVT) in outpatients suspected of PE or DVT. Results should be correlated with clinical presentation. Performed at [facility], [address] [place], [place] [address]    Prothrombin Time 07/03/2022 18.8 (H)  11.4 - 15.2 seconds Final   INR 07/03/2022 1.6 (H)  0.8 - 1.2 Final   Comment: (NOTE) INR goal varies based on device and disease states. Performed at [facility], [address] [place], [place] [address]    aPTT 07/03/2022 36  24 - 36 seconds Final   Performed at [facility], [address], [place], [place] [address]   Ferritin 07/03/2022 48  11 - 307 ng/mL Final   Performed at [facility], [address] [place], [place] [address]   Hgb F 07/03/2022 0.0  0.0 - 2.0 % Final   Hgb A 07/03/2022 97.5  96.4 - 98.8 % Final   Hgb A2 07/03/2022 2.5  1.8 - 3.2 % Final   Hgb S 07/03/2022 0.0  0.0 % Final   Interpretation, Hgb Fract 07/03/2022 Comment   Final   Comment: (NOTE) Normal hemoglobin present; no hemoglobin variant or beta thalassemia identified. Note: Alpha thalassemia may not be detected by the Hgb Fractionation Cascade panel. If alpha thalassemia is suspected, [facility] offers Alpha-Thalassemia DNA Analysis [phone]). Performed At: [facility] [address] [place], [place] [id number] Jolene Schimke MD  ZO:[id number]    Sodium 07/03/2022 138  135 - 145 mmol/L Final    Potassium 07/03/2022 2.8 (L)  3.5 - 5.1 mmol/L Final   Chloride 07/03/2022 103  98 - 111 mmol/L Final   CO2 07/03/2022 29  22 - 32 mmol/L Final   Glucose, Bld 07/03/2022 91  70 - 99 mg/dL Final   Glucose reference range applies only to samples taken after fasting for at least 8 hours.   BUN 07/03/2022 20  6 - 20 mg/dL Final   Creatinine 81/19/1478 1.01 (H)  0.44 - 1.00 mg/dL Final   Calcium 29/56/2130 9.0  8.9 - 10.3 mg/dL Final   Total Protein 86/57/8469 7.9  6.5 - 8.1 g/dL Final   Albumin 62/95/2841 3.6  3.5 - 5.0 g/dL Final   AST 32/44/0102 14 (L)  15 - 41 U/L Final   ALT 07/03/2022 14  0 - 44 U/L Final   Alkaline Phosphatase 07/03/2022 67  38 - 126 U/L Final   Total Bilirubin 07/03/2022 0.3  0.3 - 1.2 mg/dL Final   GFR, Estimated 07/03/2022 >60  >60 mL/min Final   Comment: (NOTE) Calculated using the CKD-EPI Creatinine Equation (2021)    Anion gap 07/03/2022 6  5 - 15 Final   Performed at [facility], [address], [place], [place] [address]   WBC Count 07/03/2022 12.2 (H)  4.0 - 10.5 K/uL Final   RBC 07/03/2022 4.68  3.87 - 5.11 MIL/uL Final   Hemoglobin 07/03/2022 12.3  12.0 - 15.0 g/dL Final   HCT 64/40/3474 38.2  36.0 - 46.0 % Final   MCV 07/03/2022 81.6  80.0 - 100.0 fL Final   MCH 07/03/2022 26.3  26.0 - 34.0 pg Final   MCHC 07/03/2022 32.2  30.0 - 36.0 g/dL Final   RDW 25/95/6387 14.6  11.5 - 15.5 % Final   Platelet Count 07/03/2022 457 (H)  150 - 400 K/uL Final   nRBC 07/03/2022 0.0  0.0 - 0.2 % Final   Neutrophils Relative % 07/03/2022 58  % Final   Neutro Abs 07/03/2022 7.0  1.7 - 7.7 K/uL Final   Lymphocytes Relative 07/03/2022 35  % Final   Lymphs Abs 07/03/2022 4.3 (H)  0.7 - 4.0 K/uL Final   Monocytes Relative 07/03/2022 4  % Final   Monocytes Absolute 07/03/2022 0.5  0.1 - 1.0 K/uL Final   Eosinophils Relative 07/03/2022 2  % Final   Eosinophils Absolute 07/03/2022 0.2  0.0 - 0.5 K/uL Final   Basophils Relative 07/03/2022 1  %  Final   Basophils Absolute 07/03/2022 0.1  0.0 - 0.1 K/uL Final   Immature Granulocytes 07/03/2022 0  % Final   Abs Immature Granulocytes 07/03/2022 0.05  0.00 - 0.07 K/uL Final   Performed at [facility], [address] [place], [place] [address]  Office Visit on 07/03/2022  Component Date Value Ref Range Status   Beta-2 Glyco I IgG 07/03/2022 <9  0 - 20 GPI IgG units Final   Comment: (NOTE) The reference interval reflects a 3SD or 99th percentile interval, which is thought to represent a potentially clinically significant result in accordance with the International Consensus Statement on the classification criteria for definitive antiphospholipid syndrome (APS). J Thromb Haem 2006;4:295-306.    Beta-2-Glycoprotein I IgM 07/03/2022 <9  0 - 32 GPI IgM units Final   Comment: (NOTE) The reference interval reflects a 3SD or 99th percentile interval, which is thought to represent a potentially clinically  significant result in accordance with the International Consensus Statement on the classification criteria for definitive antiphospholipid syndrome (APS). J Thromb Haem 2006;4:295-306. Performed At: [facility] [address] [place], [place] [id number] Jolene Schimke MD NW:[id number]    Beta-2-Glycoprotein I IgA 07/03/2022 <9  0 - 25 GPI IgA units Final   Comment: (NOTE) The reference interval reflects a 3SD or 99th percentile interval, which is thought to represent a potentially clinically significant result in accordance with the International Consensus Statement on the classification criteria for definitive antiphospholipid syndrome (APS). J Thromb Haem 2006;4:295-306.    Anticardiolipin IgG 07/03/2022 <9  0 - 14 GPL U/mL Final   Comment: (NOTE)                          Negative:              <15                          Indeterminate:     15 - 20                          Low-Med Positive: >20 - 80                          High Positive:          >80    Anticardiolipin IgM 07/03/2022 <9  0 - 12 MPL U/mL Final   Comment: (NOTE)                          Negative:              <13                          Indeterminate:     13 - 20                          Low-Med Positive: >20 - 80                          High Positive:         >80 Performed At: [facility] [address] [place], [place] [id number] Jolene Schimke MD BM:[id number]    Recommendations-PTGENE: 07/03/2022 Comment   Final   Comment: (NOTE) Result: c.*97G>A - Not Detected This result is not associated with an increased risk for venous thromboembolism. See Additional Clinical Information and Comments. Additional Clinical Information: Venous thromboembolism is a multifactorial disease influenced by genetic, environmental, and circumstantial risk factors. The c.*97G>A variant in the F2 gene is a genetic risk factor for venous thromboembolism. Heterozygous carriers have a 2- to 4-fold increased risk for venous thromboembolism. Homozygotes for the c.*97G>A variant are rare. The annual risk of VTE in homozygotes has been reported to be 1.1%/year. Individuals who carry both a c.*97G>A variant in the F2 gene and a c.1601G>A (p. Arg534Gln) variant in the F5 gene (commonly referred to as Factor V Leiden) have an approximately 20- fold increased risk for venous thromboembolism. Risks are likely to be even higher in more complex genotype combinations involving the F2 c.*97G>A variant and Factor V Leiden (PMID:  16109604). Additional risk factors include but are not limited to: deficiency of protein C, protein S, or antithrombin III, age, female sex, personal or family history of deep vein thromboembolism, smoking, surgery, prolonged immobilization, malignant neoplasm, tamoxifen treatment, raloxifene treatment, oral contraceptive use, hormone replacement therapy, and pregnancy. Management of thrombotic risk and thrombotic events  should follow established guidelines and fit the clinical circumstance. This result cannot predict the occurrence or recurrence of a thrombotic event. Comments: Genetic counseling is recommended to discuss the potential clinical implications of positive results, as well as recommendations for testing family members. Genetic Coordinators are available for health care providers to discuss results at [phone] ([phone]). Test Details: Variant analyzed: c.*97G>A, previously referred to as G20210A Methods/Limitations: DNA analysis of the F2 gene (NM_000                          506.5) was performed by PCR amplification followed by restriction enzyme analysis. The diagnostic sensitivity is >99%. Results must be combined with clinical information for the most accurate interpretation. Molecular-based testing is highly accurate, but as in any laboratory test, diagnostic errors may occur. False positive or false negative results may occur for reasons that include genetic variants, blood transfusions, bone marrow transplantation, somatic or tissue-specific mosaicism, mislabeled samples, or erroneous representation of family relationships. This test was developed and its performance characteristics determined by [facility]. It has not been cleared or approved by the Food and Drug Administration. References: Dewitt Hoes [facility], Valentina Lucks [facility]; [facility] Professional Practice and Guidelines Committee. Addendum: [employer] of Medical Genetics consensus statement on factor V Leiden mutation testing. Genet Med. 2021 Mar 5. doi: 81.1914/N829                          36-021-01108-x. PMID: 56213086. Cherrie Gauze. Prothrombin Thrombophilia. 2006 Jul 25 [Updated 2021 Feb 4]. In: Bufford Buttner, Ardinger HH, Pagon RA, et al., editors. GeneReviews(R) [Internet]. [address] ([place]): [employer] [place], [place]; 5784-6962. Available from: https://www.dunlap.com/ Nita Sickle, Blanca Friend, Alvie Heidelberg CS; [employer] Laboratory Quality Assurance Committee. Venous thromboembolism laboratory testing (factor V Leiden and factor II c.*97G>A), 2018 update: a technical standard of the [employer] of [employer] and [employer] ([facility]). Genet Med. 2018 Dec;20(12):1489-1498. doi: 10.1038/s41436-903-206-6267-z. Epub 2018 Oct 5. PMID: 95284132.    Reviewed by: 07/03/2022 Comment   Final   Comment: (NOTE) Technical Component performed at [facility] Professional Component performed by: Elwyn Lade, Ph.D., [facility] Director, Molecular Genetics [address] [place] [address] Performed At: [facility] [address] [place], [place] [id number] Maurine Simmering MDPhD IH:[id number]     RADIOGRAPHIC STUDIES: I have personally reviewed the radiological images as listed and agree with the findings in the report  No results found.  ASSESSMENT/PLAN  42 y.o. female is here because of history of VTE.  Medical history notable for hypertension, obesity  Pulmonary embolism  July 07, 2013: CTPA showed several small left lower lobe pulmonary emboli, nonobstructing.   VTE risk factors  July 03 2022- Patient has two identifiable risk factors- Obesity and OCP use.    Hypercoagulable state evaluation March 2024- Will obtain CBC with diff, CMP, PT, PTT, Factor V Leiden Gene, Prothrombin Gene, anti-beta2 glycolipoprotein, anticardiolipan antibody, d-dimer, ANA with reflex, RF.  Can consider PCR for bcr-abl, JAK 2 with reflex, flow for PNH, FVIII level.    Because of  anticoagulation with a direct thrombin inhibitor can not obtain Protein C, Protein S, ATIII activities or Lupus Anticoagulant as those are interfered with by these agents.     Granulocytosis Possible causes in this patient Acute infections Bacterial Various pyogenic cocci, Escherichia coli, Pseudomonas aeruginosa, Corynebacterium diphtheriae, Francisella tularensis   Spirochaetal Syphilis,  Leptospirosis   Rickettsial Typhus, Rocky Mountain spotted fever   Chlamydial psittacosis   Viral Rabies, poliomyelitis, smallpox, herpes simplex infection, herpes zoster, chickenpox   Protozoal Pneumocystis carinii infection   Mycotic actinomycosis, coccidioidomycosis   Helmenthic liver fluke, filariasis  Acute Inflammation not caused by infection  Surgery, burns, infarcts, hepatic necrosis, crush injuries, RA, rheumatic fever, vasculitis, myositis, pancreatitis, hypersensitivity reactions.   Endocrine/ Metabolic  Cushing's syndrome, thyrotoxicosis, uremia, DKA, gout, DM Type II, Obesity Polycystic ovary syndrome  Myeloproliferative neoplasms and myelodysplastic/myeloproliferative neoplasms    Malignant Diseases:   Carcinoma and solid tumors Lymphoma  Drugs  albuterol, amphetamines, corticosteroids, marijuana   Hereditary  activating mutation in CSF3R   Initial Evaluation:  Obtain CBC with diff, CMP, smear for morphology, CRP/ESR, PCR for bcr-abl, MPN hotspot panel.  Review medications.     Cancer Staging  No matching staging information was found for the patient.   No problem-specific Assessment & Plan notes found for this encounter.    Orders Placed This Encounter  Procedures   CBC with Differential (Cancer Center Only)    Standing Status:   Future    Number of Occurrences:   1    Standing Expiration Date:   07/03/2023   CMP (Cancer Center only)    Standing Status:   Future    Number of Occurrences:   1    Standing Expiration Date:   07/03/2023   Hgb Fractionation Cascade    Standing Status:   Future    Number of Occurrences:   1    Standing Expiration Date:   07/03/2023   Ferritin    Standing Status:   Future    Number of Occurrences:   1    Standing Expiration Date:   07/03/2023   APTT    Standing Status:   Future    Number of Occurrences:   1    Standing Expiration Date:   07/03/2023   Protime-INR    Standing Status:   Future    Number of Occurrences:   1     Standing Expiration Date:   07/03/2023   D-dimer, quantitative    Standing Status:   Future    Number of Occurrences:   1    Standing Expiration Date:   07/03/2023   Fibrinogen    Standing Status:   Future    Number of Occurrences:   1    Standing Expiration Date:   07/03/2023   Beta-2-glycoprotein i abs, IgG/M/A   Cardiolipin antibodies, IgM+IgG   Factor 5 leiden   Prothrombin gene mutation    60  minutes was spent in patient care.  This included time spent preparing to see the patient (e.g., review of tests), obtaining and/or reviewing separately obtained history, counseling and educating the patient; ordering tests, or procedures; documenting clinical information in the electronic or other health record, independently interpreting results and communicating results to the patient as well as coordination of care.       All questions were answered. The patient knows to call the clinic with any problems, questions or concerns.  This note was electronically signed.    Loni Muse, MD  07/20/2022 12:56  PM

## 2022-07-05 LAB — CARDIOLIPIN ANTIBODIES, IGM+IGG
Anticardiolipin IgG: <9 GPL U/mL (ref 0–14)
Anticardiolipin IgM: <9 MPL U/mL (ref 0–12)

## 2022-07-06 LAB — BETA-2-GLYCOPROTEIN I ABS, IGG/M/A
Beta-2 Glyco I IgG: <9 GPI IgG units (ref 0–20)
Beta-2-Glycoprotein I IgA: <9 GPI IgA units (ref 0–25)
Beta-2-Glycoprotein I IgM: <9 GPI IgM units (ref 0–32)

## 2022-07-07 ENCOUNTER — Telehealth: Payer: Self-pay | Admitting: Internal Medicine

## 2022-07-07 ENCOUNTER — Ambulatory Visit: Payer: BC Managed Care – PPO | Admitting: Internal Medicine

## 2022-07-07 ENCOUNTER — Telehealth: Payer: Self-pay | Admitting: Oncology

## 2022-07-07 DIAGNOSIS — E876 Hypokalemia: Secondary | ICD-10-CM

## 2022-07-07 LAB — HGB FRACTIONATION CASCADE
Hgb A2: 2.5 % (ref 1.8–3.2)
Hgb A: 97.5 % (ref 96.4–98.8)
Hgb F: 0 % (ref 0.0–2.0)
Hgb S: 0 %

## 2022-07-07 NOTE — Telephone Encounter (Signed)
Scheduled per 03/30 los, patient has been called and notified. ?

## 2022-07-07 NOTE — Telephone Encounter (Signed)
-----   Message from Aleatha Borer, [place] sent at 07/07/2022  9:47 AM EDT ----- Please review patient's lab for the potassium level make any medication adjustments as needed. Dr. Federico Flake did not send anything in for the patient. ----- Message ----- From: Barbee Cough, MD Sent: 07/06/2022   8:52 AM EDT To: Aleatha Borer, CMA  Please forward lab results to PCP.  Patient's potassium is low Thank you ----- Message ----- From: Interface, Lab In [place] Sent: 07/03/2022  12:58 PM EDT To: Barbee Cough, MD

## 2022-07-08 NOTE — Telephone Encounter (Signed)
Spoke to patient- notified of lab result and provider recommendations. Patient states she has been consistent with her K+ for the past month. Patient has been scheduled for lab next week.

## 2022-07-08 NOTE — Telephone Encounter (Signed)
Called LVM to call back

## 2022-07-12 ENCOUNTER — Encounter: Payer: Self-pay | Admitting: Internal Medicine

## 2022-07-12 ENCOUNTER — Other Ambulatory Visit: Payer: Self-pay | Admitting: Internal Medicine

## 2022-07-12 DIAGNOSIS — I10 Essential (primary) hypertension: Secondary | ICD-10-CM

## 2022-07-12 MED ORDER — POTASSIUM CHLORIDE CRYS ER 20 MEQ PO TBCR: 40.0000 meq | EXTENDED_RELEASE_TABLET | Freq: Every day | ORAL | 2 refills | Status: DC

## 2022-07-13 ENCOUNTER — Ambulatory Visit: Payer: BC Managed Care – PPO | Admitting: Internal Medicine

## 2022-07-13 ENCOUNTER — Other Ambulatory Visit: Payer: BC Managed Care – PPO

## 2022-07-13 ENCOUNTER — Encounter: Payer: Self-pay | Admitting: Internal Medicine

## 2022-07-14 ENCOUNTER — Ambulatory Visit: Payer: BC Managed Care – PPO | Attending: Internal Medicine

## 2022-07-14 ENCOUNTER — Other Ambulatory Visit: Payer: BC Managed Care – PPO

## 2022-07-14 DIAGNOSIS — E876 Hypokalemia: Secondary | ICD-10-CM

## 2022-07-15 LAB — POTASSIUM: Potassium: 3.5 mmol/L (ref 3.5–5.2)

## 2022-07-16 ENCOUNTER — Other Ambulatory Visit: Payer: Self-pay | Admitting: Oncology

## 2022-07-16 DIAGNOSIS — D72828 Other elevated white blood cell count: Secondary | ICD-10-CM | POA: Insufficient documentation

## 2022-07-16 DIAGNOSIS — Z86711 Personal history of pulmonary embolism: Secondary | ICD-10-CM

## 2022-07-16 DIAGNOSIS — D72829 Elevated white blood cell count, unspecified: Secondary | ICD-10-CM

## 2022-07-16 LAB — PROTHROMBIN GENE MUTATION

## 2022-07-16 NOTE — Progress Notes (Signed)
[facility] Cancer Follow up Visit:  Patient Care Team: Marcine Matar, MD as PCP - General (Internal Medicine)  CHIEF COMPLAINTS/PURPOSE OF CONSULTATION:  HISTORY OF PRESENTING ILLNESS: Karen Robles 42 y.o. female is here because of history of VTE Medical history notable for hypertension, obesity  July 07, 2013: CTPA showed several small left lower lobe pulmonary emboli, nonobstructing.  June 01, 2022 WBC 13.5 hemoglobin 13.7 platelet count 491  July 03 2022:  [facility] Hematology Consult Patient states that had a sudden syncopal episode on the day prior to the diagnosis of PE in 2015.  She was on OCP's at the time of dx and weight was similar to what it is now.  Patient was treated with Xarelto which she has remained on since that event.   She is G2 P2.  No history of VTE related to pregnancy.  Her children are ages 69 and 31.  Children were delivered by [facility]. Patient has been on Depot Provera since May 2012 for birth control.   She is not interested in having more children and would consider tubal ligation.    Social:  Single.  Works at [employer].  No tobacco.  EtOH none  FMH Mother alive 69 HTN Father estranged Brother alive late 65's asthma  WBC 12.2 hemoglobin 12.3 MCV 82 platelet count 457; 58 segs 35 lymphs 4 monos 2 eos 1 basophil Hemoglobin electrophoresis normal adult pattern D-dimer undetectable.  Fibrinogen 678.  INR 1.6 PTT 36 Prothrombin gene mutation negative  Factor V Leiden negative Anticardiolipin antibody negative antibeta 2 glycoprotein negative Ferritin 48 CMP notable for potassium 2.8 creatinine 1.01  July 17 2022:  Scheduled follow up regarding DVT and hypercoagulable state.  Reviewed results of labs with patient. Will decrease Xarelto to 15 mg daily.  Follow up in 3 months  Review of Systems  Constitutional:  Negative for appetite change, chills, fatigue, fever and unexpected weight change.  HENT:   Negative for mouth  sores, nosebleeds and sore throat.   Eyes:  Negative for eye problems and icterus.       Vision changes:  None  Respiratory:  Negative for chest tightness, cough, hemoptysis and wheezing.        DOE walking up stairs  Cardiovascular:  Negative for chest pain, leg swelling and palpitations.       PND:  none Orthopnea:  none  Gastrointestinal:  Negative for abdominal pain, blood in stool, constipation, diarrhea, nausea and vomiting.  Endocrine: Negative for hot flashes.       Cold intolerance:  none Heat intolerance:  none  Genitourinary:  Negative for bladder incontinence, difficulty urinating, dysuria, frequency, hematuria and nocturia.   Musculoskeletal:  Negative for back pain, gait problem, myalgias, neck pain and neck stiffness.       Arthralgias left foot  Skin:  Negative for itching, rash and wound.  Neurological:  Negative for dizziness, gait problem, headaches, light-headedness and numbness.  Hematological:  Negative for adenopathy. Does not bruise/bleed easily.  Psychiatric/Behavioral:  Negative for sleep disturbance and suicidal ideas. The patient is not nervous/anxious.     MEDICAL HISTORY: Past Medical History:  Diagnosis Date   Hypertension    Obesity    Pulmonary embolism (HCC)     SURGICAL HISTORY: Past Surgical History:  Procedure Laterality Date   MOUTH SURGERY     WISDOM TOOTH EXTRACTION Bilateral     SOCIAL HISTORY: Social History   Socioeconomic History   Marital status: Single  Spouse name: Not on file   Number of children: 2   Years of education: Not on file   Highest education level: Not on file  Occupational History   Occupation: Unemployed  Tobacco Use   Smoking status: Never   Smokeless tobacco: Never  Vaping Use   Vaping Use: Never used  Substance and Sexual Activity   Alcohol use: No    Alcohol/week: 0.0 standard drinks of alcohol   Drug use: No   Sexual activity: Yes    Partners: Male    Birth control/protection: Injection   Other Topics Concern   Not on file  Social History Narrative   Not on file   Social Determinants of Health   Financial Resource Strain: Not on file  Food Insecurity: No Food Insecurity (07/03/2022)   Hunger Vital Sign    Worried About Running Out of Food in the Last Year: Never true    Ran Out of Food in the Last Year: Never true  Transportation Needs: No Transportation Needs (07/03/2022)   PRAPARE - Administrator, Civil Service (Medical): No    Lack of Transportation (Non-Medical): No  Physical Activity: Not on file  Stress: Not on file  Social Connections: Not on file  Intimate Partner Violence: Not At Risk (07/03/2022)   Humiliation, Afraid, Rape, and Kick questionnaire    Fear of Current or Ex-Partner: No    Emotionally Abused: No    Physically Abused: No    Sexually Abused: No    FAMILY HISTORY Family History  Problem Relation Age of Onset   Hypertension Mother        maternal aunts & uncles   Hyperlipidemia Mother    Healthy Brother    Hypertension Maternal Aunt    Hypertension Maternal Uncle    Cancer Maternal Grandmother    Diabetes Neg Hx    Heart disease Neg Hx     ALLERGIES:  has No Known Allergies.  MEDICATIONS:  Current Outpatient Medications  Medication Sig Dispense Refill   hydrochlorothiazide (HYDRODIURIL) 25 MG tablet Take 1 tablet (25 mg total) by mouth daily. 90 tablet 1   medroxyPROGESTERone (DEPO-PROVERA) 150 MG/ML injection Inject 1 mL (150 mg total) into the muscle every 3 (three) months. 1 mL 0   potassium chloride SA (KLOR-CON M) 20 MEQ tablet Take 2 tablets (40 mEq total) by mouth daily. 180 tablet 2   Rivaroxaban (XARELTO) 15 MG TABS tablet Take 1 tablet (15 mg total) by mouth daily. 30 tablet 3   No current facility-administered medications for this visit.    PHYSICAL EXAMINATION:  ECOG PERFORMANCE STATUS: 1 - Symptomatic but completely ambulatory   Vitals:   07/17/22 0901  BP: 133/84  Pulse: (!) 105  Resp: 19   Temp: 98.1 F (36.7 C)  SpO2: 100%    Filed Weights   07/17/22 0901  Weight: (!) 329 lb 11.2 oz (149.6 kg)     Physical Exam Vitals and nursing note reviewed.  Constitutional:      General: She is not in acute distress.    Appearance: Normal appearance. She is obese. She is not ill-appearing, toxic-appearing or diaphoretic.  HENT:     Head: Normocephalic and atraumatic.     Right Ear: External ear normal.     Left Ear: External ear normal.     Nose: Nose normal. No congestion or rhinorrhea.  Eyes:     General: No scleral icterus.    Extraocular Movements: Extraocular movements intact.  Conjunctiva/sclera: Conjunctivae normal.     Pupils: Pupils are equal, round, and reactive to light.  Cardiovascular:     Rate and Rhythm: Normal rate and regular rhythm.     Heart sounds: Normal heart sounds. No murmur heard.    No friction rub. No gallop.  Pulmonary:     Effort: Pulmonary effort is normal. No respiratory distress.     Breath sounds: Normal breath sounds. No wheezing.  Abdominal:     General: Bowel sounds are normal.     Palpations: Abdomen is soft.     Tenderness: There is no abdominal tenderness. There is no guarding or rebound.  Musculoskeletal:        General: No swelling, tenderness or deformity.     Cervical back: Normal range of motion and neck supple. No rigidity or tenderness.     Right lower leg: No edema.     Left lower leg: No edema.  Lymphadenopathy:     Head:     Right side of head: No submental, submandibular, tonsillar, preauricular, posterior auricular or occipital adenopathy.     Left side of head: No submental, submandibular, tonsillar, preauricular, posterior auricular or occipital adenopathy.     Cervical: No cervical adenopathy.     Right cervical: No superficial, deep or posterior cervical adenopathy.    Left cervical: No superficial, deep or posterior cervical adenopathy.     Upper Body:     Right upper body: No supraclavicular, axillary,  pectoral or epitrochlear adenopathy.     Left upper body: No supraclavicular, axillary, pectoral or epitrochlear adenopathy.  Skin:    General: Skin is warm.     Coloration: Skin is not jaundiced.  Neurological:     General: No focal deficit present.     Mental Status: She is alert and oriented to person, place, and time.     Cranial Nerves: No cranial nerve deficit.     Motor: No weakness.  Psychiatric:        Mood and Affect: Mood normal.        Behavior: Behavior normal.        Thought Content: Thought content normal.        Judgment: Judgment normal.      LABORATORY DATA: I have personally reviewed the data as listed:  Appointment on 07/17/2022  Component Date Value Ref Range Status   Sed Rate 07/17/2022 70 (H)  0 - 22 mm/hr Final   Performed at [facility], [address], [place], [place] [address]   D-Dimer, Sharene Butters 07/17/2022 <0.27  0.00 - 0.50 ug/mL-FEU Final   Comment: (NOTE) At the manufacturer cut-off value of 0.5 g/mL FEU, this assay has a negative predictive value of 95-100%.This assay is intended for use in conjunction with a clinical pretest probability (PTP) assessment model to exclude pulmonary embolism (PE) and deep venous thrombosis (DVT) in outpatients suspected of PE or DVT. Results should be correlated with clinical presentation. Performed at [facility], [address] [place], [place] [address]   Appointment on 07/14/2022  Component Date Value Ref Range Status   Potassium 07/14/2022 3.5  3.5 - 5.2 mmol/L Final  Appointment on 07/03/2022  Component Date Value Ref Range Status   Fibrinogen 07/03/2022 678 (H)  210 - 475 mg/dL Final   Comment: (NOTE) Fibrinogen results may be underestimated in patients receiving thrombolytic therapy. Performed at [facility], [address], [place], [place] [address]    D-Dimer, Sharene Butters 07/03/2022 <0.27  0.00 - 0.50 ug/mL-FEU Final   Comment: (NOTE) At  the manufacturer cut-off value of 0.5 g/mL FEU, this assay has a negative predictive value of 95-100%.This assay is intended for use in conjunction with a clinical pretest probability (PTP) assessment model to exclude pulmonary embolism (PE) and deep venous thrombosis (DVT) in outpatients suspected of PE or DVT. Results should be correlated with clinical presentation. Performed at [facility], [address], [place], [place] [address]    Prothrombin Time 07/03/2022 18.8 (H)  11.4 - 15.2 seconds Final   INR 07/03/2022 1.6 (H)  0.8 - 1.2 Final   Comment: (NOTE) INR goal varies based on device and disease states. Performed at [facility], [address], [place], [place] [address]    aPTT 07/03/2022 36  24 - 36 seconds Final   Performed at [facility], [address], [place], [place] [address]   Ferritin 07/03/2022 48  11 - 307 ng/mL Final   Performed at [facility], [address], [place], [place] [address]   Hgb F 07/03/2022 0.0  0.0 - 2.0 % Final   Hgb A 07/03/2022 97.5  96.4 - 98.8 % Final   Hgb A2 07/03/2022 2.5  1.8 - 3.2 % Final   Hgb S 07/03/2022 0.0  0.0 % Final   Interpretation, Hgb Fract 07/03/2022 Comment   Final   Comment: (NOTE) Normal hemoglobin present; no hemoglobin variant or beta thalassemia identified. Note: Alpha thalassemia may not be detected by the Hgb Fractionation Cascade panel. If alpha thalassemia is suspected, [facility] offers Alpha-Thalassemia DNA Analysis [phone]). Performed At: [facility] [address] [place], [place] [id number] Jolene Schimke MD NG:[id number]    Sodium 07/03/2022 138  135 - 145 mmol/L Final   Potassium 07/03/2022 2.8 (L)  3.5 - 5.1 mmol/L Final   Chloride 07/03/2022 103  98 - 111 mmol/L Final   CO2 07/03/2022 29  22 - 32 mmol/L Final   Glucose, Bld 07/03/2022 91  70 - 99 mg/dL Final   Glucose reference range applies only to samples  taken after fasting for at least 8 hours.   BUN 07/03/2022 20  6 - 20 mg/dL Final   Creatinine 40/01/2724 1.01 (H)  0.44 - 1.00 mg/dL Final   Calcium 36/64/4034 9.0  8.9 - 10.3 mg/dL Final   Total Protein 74/25/9563 7.9  6.5 - 8.1 g/dL Final   Albumin 87/56/4332 3.6  3.5 - 5.0 g/dL Final   AST 95/18/8416 14 (L)  15 - 41 U/L Final   ALT 07/03/2022 14  0 - 44 U/L Final   Alkaline Phosphatase 07/03/2022 67  38 - 126 U/L Final   Total Bilirubin 07/03/2022 0.3  0.3 - 1.2 mg/dL Final   GFR, Estimated 07/03/2022 >60  >60 mL/min Final   Comment: (NOTE) Calculated using the CKD-EPI Creatinine Equation (2021)    Anion gap 07/03/2022 6  5 - 15 Final   Performed at [facility], [address], [place], [place] [address]   WBC Count 07/03/2022 12.2 (H)  4.0 - 10.5 K/uL Final   RBC 07/03/2022 4.68  3.87 - 5.11 MIL/uL Final   Hemoglobin 07/03/2022 12.3  12.0 - 15.0 g/dL Final   HCT 16/04/930 38.2  36.0 - 46.0 % Final   MCV 07/03/2022 81.6  80.0 - 100.0 fL Final   MCH 07/03/2022 26.3  26.0 - 34.0 pg Final   MCHC 07/03/2022 32.2  30.0 - 36.0 g/dL Final  RDW 07/03/2022 14.6  11.5 - 15.5 % Final   Platelet Count 07/03/2022 457 (H)  150 - 400 K/uL Final   nRBC 07/03/2022 0.0  0.0 - 0.2 % Final   Neutrophils Relative % 07/03/2022 58  % Final   Neutro Abs 07/03/2022 7.0  1.7 - 7.7 K/uL Final   Lymphocytes Relative 07/03/2022 35  % Final   Lymphs Abs 07/03/2022 4.3 (H)  0.7 - 4.0 K/uL Final   Monocytes Relative 07/03/2022 4  % Final   Monocytes Absolute 07/03/2022 0.5  0.1 - 1.0 K/uL Final   Eosinophils Relative 07/03/2022 2  % Final   Eosinophils Absolute 07/03/2022 0.2  0.0 - 0.5 K/uL Final   Basophils Relative 07/03/2022 1  % Final   Basophils Absolute 07/03/2022 0.1  0.0 - 0.1 K/uL Final   Immature Granulocytes 07/03/2022 0  % Final   Abs Immature Granulocytes 07/03/2022 0.05  0.00 - 0.07 K/uL Final   Performed at [facility], [address], [place], [place] [address]  Office Visit on 07/03/2022  Component Date Value Ref Range Status   Beta-2 Glyco I IgG 07/03/2022 <9  0 - 20 GPI IgG units Final   Comment: (NOTE) The reference interval reflects a 3SD or 99th percentile interval, which is thought to represent a potentially clinically significant result in accordance with the International Consensus Statement on the classification criteria for definitive antiphospholipid syndrome (APS). J Thromb Haem 2006;4:295-306.    Beta-2-Glycoprotein I IgM 07/03/2022 <9  0 - 32 GPI IgM units Final   Comment: (NOTE) The reference interval reflects a 3SD or 99th percentile interval, which is thought to represent a potentially clinically significant result in accordance with the International Consensus Statement on the classification criteria for definitive antiphospholipid syndrome (APS). J Thromb Haem 2006;4:295-306. Performed At: [facility] [address] [place], [place] [id number] Jolene Schimke MD AO:[id number]    Beta-2-Glycoprotein I IgA 07/03/2022 <9  0 - 25 GPI IgA units Final   Comment: (NOTE) The reference interval reflects a 3SD or 99th percentile interval, which is thought to represent a potentially clinically significant result in accordance with the International Consensus Statement on the classification criteria for definitive antiphospholipid syndrome (APS). J Thromb Haem 2006;4:295-306.    Anticardiolipin IgG 07/03/2022 <9  0 - 14 GPL U/mL Final   Comment: (NOTE)                          Negative:              <15                          Indeterminate:     15 - 20                          Low-Med Positive: >20 - 80                          High Positive:         >80    Anticardiolipin IgM 07/03/2022 <9  0 - 12 MPL U/mL Final   Comment: (NOTE)                          Negative:              <  13                          Indeterminate:     13 - 20                          Low-Med Positive: >20 - 80                           High Positive:         >80 Performed At: [facility] [address] [place], [place] [id number] Jolene Schimke MD FH:[id number]    Recommendations-F5LEID: 07/03/2022 Comment   Final   Comment: (NOTE) Result: c.1601G>A (p.Arg534Gln) - Not Detected This result is not associated with an increased risk for venous thromboembolism. See Additional Clinical Information and Comments. Additional Clinical Information: Venous thromboembolism is a multifactorial disease influenced by genetic, environmental, and circumstantial risk factors. The c.1601G>A (p. Arg534Gln) variant in the F5 gene, commonly referred to as Factor V Leiden, is a genetic risk factor for venous thromboembolism. Heterozygous carriers of this variant have a 6- to 8- fold increased risk for venous thromboembolism. Individuals homozygous for this variant (ie, with a copy of the variant on each chromosome) have an approximately 80-fold increased risk for venous thromboembolism. Individuals who carry both a c.*97G>A variant in the F2 gene and Factor V Leiden have an approximately 20-fold increased risk for venous thromboembolism. Risks are likely to be even higher in more complex genotype combinations in                          volving the F2 c.*97G>A variant and Factor V Leiden (PMID: 49826415). Additional risk factors include but are not limited to: deficiency of protein C, protein S, or antithrombin III, age, female sex, personal or family history of deep vein thromboembolism, smoking, surgery, prolonged immobilization, malignant neoplasm, tamoxifen treatment, raloxifene treatment, oral contraceptive use, hormone replacement therapy, and pregnancy. Management of thrombotic risk and thrombotic events should follow established guidelines and fit the clinical circumstance. This result cannot predict the occurrence or recurrence of a thrombotic event. Comment: Genetic counseling is recommended to  discuss the potential clinical implications of positive results, as well as recommendations for testing family members. Genetic Coordinators are available for health care providers to discuss results at [phone]). Test Details: Variant Analyzed: c.1601G>A (p. Arg534Gln), referred to as Fact                          or V Leiden Methods/Limitations: DNA analysis of the F5 gene (NM_000130.5) was performed by PCR amplification followed by restriction enzyme analysis. The diagnostic sensitivity is >99%. Results must be combined with clinical information for the most accurate interpretation. Molecular- based testing is highly accurate, but as in any laboratory test, diagnostic errors may occur. False positive or false negative results may occur for reasons that include genetic variants, blood transfusions, bone marrow transplantation, somatic or tissue-specific mosaicism, mislabeled samples, or erroneous representation of family relationships. This test was developed and its performance characteristics determined by [facility]. It has not been cleared or approved by the Food and Drug Administration. References: Dewitt Hoes [facility], Valentina Lucks [facility]; [facility] Professional Practice and Guidelines Committee. Addendum: Curator of Medical Genetics consensus statement on fac  tor V Leiden mutation testing. Genet Med. 2021 Mar 5. doi: 16.1096/E45409-811- 01108-x. PMID: 91478295. Cherrie Gauze. Factor V Leiden Thrombophilia. 1999 May 14 (Updated 2018 Jan 4). In: Bufford Buttner, Ardinger HH, Pagon RA, et al., editors. GeneReviews(R) (Internet). [address] ([place]): [place], [place]; 6213-0865. Available from: [email or web address] Nita Sickle, Blanca Friend, Alvie Heidelberg CS; [employer] Laboratory Quality Assurance Committee. Venous thromboembolism laboratory testing (factor V Leiden and factor II  c.*97G>A), 2018 update: a technical standard of the [employer] of [employer] and Genomics ([facility]). Genet Med. 2018 Dec;20(12):1489-1498. doi: 10.1038/[id number]-z. Epub 2018 Oct 5. PMID: 78469629.    Reviewed By: 07/03/2022 Comment   Final   Comment: (NOTE) Technical Component performed at [facility] Professional Component performed by: W. Harlan Stains, Ph.D., [facility] Director, Molecular Genetics [phone] [facility] [address] [place] [address] Performed At: [facility] [address] [place], [place] [id number] Maurine Simmering MDPhD YQ:[id number]    Recommendations-PTGENE: 07/03/2022 Comment   Final   Comment: (NOTE) Result: c.*97G>A - Not Detected This result is not associated with an increased risk for venous thromboembolism. See Additional Clinical Information and Comments. Additional Clinical Information: Venous thromboembolism is a multifactorial disease influenced by genetic, environmental, and circumstantial risk factors. The c.*97G>A variant in the F2 gene is a genetic risk factor for venous thromboembolism. Heterozygous carriers have a 2- to 4-fold increased risk for venous thromboembolism. Homozygotes for the c.*97G>A variant are rare. The annual risk of VTE in homozygotes has been reported to be 1.1%/year. Individuals who carry both a c.*97G>A variant in the F2 gene and a c.1601G>A (p. Arg534Gln) variant in the F5 gene (commonly referred to as Factor V Leiden) have an approximately 20- fold increased risk for venous thromboembolism. Risks are likely to be even higher in more complex genotype combinations involving the F2 c.*97G>A variant and Factor V Leiden (PMID:                           [id number]). Additional risk factors include but are not limited to: deficiency of protein C, protein S, or antithrombin III, age, female sex, personal or family history of deep vein thromboembolism, smoking, surgery, prolonged immobilization, malignant neoplasm, tamoxifen  treatment, raloxifene treatment, oral contraceptive use, hormone replacement therapy, and pregnancy. Management of thrombotic risk and thrombotic events should follow established guidelines and fit the clinical circumstance. This result cannot predict the occurrence or recurrence of a thrombotic event. Comments: Genetic counseling is recommended to discuss the potential clinical implications of positive results, as well as recommendations for testing family members. Genetic Coordinators are available for health care providers to discuss results at [phone]). Test Details: Variant analyzed: c.*97G>A, previously referred to as G20210A Methods/Limitations: DNA analysis of the F2 gene (NM_000                          506.5) was performed by PCR amplification followed by restriction enzyme analysis. The diagnostic sensitivity is >99%. Results must be combined with clinical information for the most accurate interpretation. Molecular-based testing is highly accurate, but as in any laboratory test, diagnostic errors may occur. False positive or false negative results may occur for reasons that include genetic variants, blood transfusions, bone marrow transplantation, somatic or tissue-specific mosaicism, mislabeled samples, or erroneous representation of family relationships. This test was developed and its performance characteristics determined by [facility]. It has not been cleared or approved by the Food and  Drug Administration. References: Dewitt Hoes [facility], Valentina Lucks [facility]; [facility] Professional Practice and Guidelines Committee. Addendum: [employer] of Medical Genetics consensus statement on factor V Leiden mutation testing. Genet Med. 2021 Mar 5. doi: 16.1096/E454                          36-021-01108-x. PMID: 09811914. Cherrie Gauze. Prothrombin Thrombophilia. 2006 Jul 25 [Updated 2021 Feb 4]. In: Bufford Buttner, Ardinger HH, Pagon RA, et al., editors.  GeneReviews(R) [Internet]. [address] ([place]): [place], [place]; 7829-5621. Available from: [email or web address].com/ Nita Sickle, Blanca Friend, Alvie Heidelberg CS; [employer] Laboratory Quality Assurance Committee. Venous thromboembolism laboratory testing (factor V Leiden and factor II c.*97G>A), 2018 update: a technical standard of the [employer] of [employer] and [employer] ([facility]). Genet Med. 2018 Dec;20(12):1489-1498. doi: 10.1038/[id number]-z. Epub 2018 Oct 5. PMID: 30865784.    Reviewed by: 07/03/2022 Comment   Final   Comment: (NOTE) Technical Component performed at [facility] Professional Component performed by: Elwyn Lade, Ph.D., [facility] Hospital Director, Molecular Genetics [address] [place] [address] Performed At: [facility] [address] [place], [place] [id number] Maurine Simmering MDPhD WN:[id number]     RADIOGRAPHIC STUDIES: I have personally reviewed the radiological images as listed and agree with the findings in the report  No results found.  ASSESSMENT/PLAN   42 y.o. female is here because of history of VTE.  Medical history notable for hypertension, obesity   Pulmonary embolism             July 07, 2013: CTPA showed several small left lower lobe pulmonary emboli, nonobstructing.   July 17 2022- D dimer <0.27.  Decreasing Xarelto to 15 mg po daily   VTE risk factors             July 03 2022- Patient has two identifiable risk factors- Obesity and OCP use.    July 17 2022- Since patient not interested in having additional children, consideration of tubal ligation instead of OCP's may be reasonable.     Hypercoagulable state evaluation July 03 2022- Factor V Leiden Gene, Prothrombin Gene, anti-beta2 glycolipoprotein, anticardiolipan antibody, Hgb electrophoresis normal.  normal Because of anticoagulation with a direct thrombin inhibitor can not obtain Protein C, Protein S, ATIII activities or Lupus  Anticoagulant as those are interfered with by these agents.          Granulocytosis Possible causes in this patient Acute infections Bacterial Various pyogenic cocci, Escherichia coli, Francisella tularensis   Spirochaetal Syphilis, Leptospirosis   Rickettsial Rocky Mountain spotted fever   Chlamydial psittacosis   Mycotic actinomycosis, coccidioidomycosis  Acute Inflammation not caused by infection   RA, rheumatic fever, vasculitis, myositis, hypersensitivity reactions.    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      Cancer Staging  No matching staging information was found for the patient.   No problem-specific Assessment & Plan notes found for this encounter.    No orders of the defined types were placed in this encounter.   40  minutes was spent in patient care.  This included time spent preparing to see the patient (e.g., review of tests), obtaining  and/or reviewing separately obtained history, counseling and educating the patient/family/caregiver, ordering medications, tests, or procedures; documenting clinical information in the electronic or other health record, independently interpreting results and communicating results to the patient/family/caregiver as well as coordination of care.       All questions were answered. The patient knows to call the clinic with any problems, questions or concerns.  This note was electronically signed.    Loni Muse, MD  08/03/2022 12:03 PM

## 2022-07-17 ENCOUNTER — Inpatient Hospital Stay: Payer: BC Managed Care – PPO | Attending: Oncology

## 2022-07-17 ENCOUNTER — Telehealth: Payer: Self-pay | Admitting: Oncology

## 2022-07-17 ENCOUNTER — Other Ambulatory Visit (HOSPITAL_COMMUNITY): Payer: Self-pay

## 2022-07-17 ENCOUNTER — Inpatient Hospital Stay (HOSPITAL_BASED_OUTPATIENT_CLINIC_OR_DEPARTMENT_OTHER): Payer: BC Managed Care – PPO | Admitting: Oncology

## 2022-07-17 VITALS — BP 133/84 | HR 105 | Temp 98.1°F | Resp 19 | Ht 64.0 in | Wt 329.7 lb

## 2022-07-17 DIAGNOSIS — Z7901 Long term (current) use of anticoagulants: Secondary | ICD-10-CM | POA: Diagnosis present

## 2022-07-17 DIAGNOSIS — Z809 Family history of malignant neoplasm, unspecified: Secondary | ICD-10-CM | POA: Diagnosis not present

## 2022-07-17 DIAGNOSIS — D6859 Other primary thrombophilia: Secondary | ICD-10-CM

## 2022-07-17 DIAGNOSIS — D72829 Elevated white blood cell count, unspecified: Secondary | ICD-10-CM

## 2022-07-17 DIAGNOSIS — Z86711 Personal history of pulmonary embolism: Secondary | ICD-10-CM

## 2022-07-17 DIAGNOSIS — Z3042 Encounter for surveillance of injectable contraceptive: Secondary | ICD-10-CM

## 2022-07-17 DIAGNOSIS — D72828 Other elevated white blood cell count: Secondary | ICD-10-CM

## 2022-07-17 LAB — D-DIMER, QUANTITATIVE: D-Dimer, Quant: <0.27 ug/mL-FEU (ref 0.00–0.50)

## 2022-07-17 LAB — SEDIMENTATION RATE: Sed Rate: 70 mm/hr — ABNORMAL HIGH (ref 0–22)

## 2022-07-17 MED ORDER — RIVAROXABAN 15 MG PO TABS
15.0000 mg | ORAL_TABLET | Freq: Every day | ORAL | 3 refills | Status: DC
  Filled 2022-07-17 – 2022-09-10 (×3): qty 30, 30d supply, fill #0
  Filled 2022-10-20 – 2022-10-28 (×2): qty 30, 30d supply, fill #1
  Filled 2022-11-21: qty 30, 30d supply, fill #2
  Filled 2022-12-18: qty 30, 30d supply, fill #3

## 2022-07-17 NOTE — Telephone Encounter (Signed)
Patient called to notify us that she would be a few minutes late to lab. Noted in appointment notes and forwarding to RN.

## 2022-07-20 LAB — FACTOR 5 LEIDEN

## 2022-07-21 ENCOUNTER — Telehealth: Payer: Self-pay | Admitting: Oncology

## 2022-07-24 ENCOUNTER — Other Ambulatory Visit (HOSPITAL_COMMUNITY): Payer: Self-pay

## 2022-07-28 ENCOUNTER — Ambulatory Visit: Payer: BC Managed Care – PPO | Admitting: Obstetrics

## 2022-07-31 ENCOUNTER — Other Ambulatory Visit: Payer: Self-pay

## 2022-08-03 DIAGNOSIS — Z7901 Long term (current) use of anticoagulants: Secondary | ICD-10-CM | POA: Insufficient documentation

## 2022-08-03 DIAGNOSIS — D6859 Other primary thrombophilia: Secondary | ICD-10-CM | POA: Insufficient documentation

## 2022-08-18 ENCOUNTER — Other Ambulatory Visit: Payer: Self-pay | Admitting: Internal Medicine

## 2022-08-18 DIAGNOSIS — I10 Essential (primary) hypertension: Secondary | ICD-10-CM

## 2022-08-21 ENCOUNTER — Other Ambulatory Visit (HOSPITAL_COMMUNITY)
Admission: RE | Admit: 2022-08-21 | Discharge: 2022-08-21 | Disposition: A | Discharge status: Home / Self Care | Payer: BC Managed Care – PPO | Source: Ambulatory Visit | Attending: Obstetrics | Admitting: Obstetrics

## 2022-08-21 ENCOUNTER — Encounter: Payer: Self-pay | Admitting: Obstetrics

## 2022-08-21 ENCOUNTER — Ambulatory Visit (INDEPENDENT_AMBULATORY_CARE_PROVIDER_SITE_OTHER): Payer: BC Managed Care – PPO | Admitting: Obstetrics

## 2022-08-21 VITALS — BP 135/87 | HR 92 | Ht 63.0 in | Wt 327.3 lb

## 2022-08-21 DIAGNOSIS — Z01419 Encounter for gynecological examination (general) (routine) without abnormal findings: Secondary | ICD-10-CM | POA: Insufficient documentation

## 2022-08-21 DIAGNOSIS — Z6841 Body Mass Index (BMI) 40.0 and over, adult: Secondary | ICD-10-CM

## 2022-08-21 DIAGNOSIS — Z3042 Encounter for surveillance of injectable contraceptive: Secondary | ICD-10-CM

## 2022-08-21 DIAGNOSIS — Z113 Encounter for screening for infections with a predominantly sexual mode of transmission: Secondary | ICD-10-CM | POA: Diagnosis not present

## 2022-08-21 DIAGNOSIS — Z1239 Encounter for other screening for malignant neoplasm of breast: Secondary | ICD-10-CM

## 2022-08-21 DIAGNOSIS — N898 Other specified noninflammatory disorders of vagina: Secondary | ICD-10-CM | POA: Insufficient documentation

## 2022-08-21 DIAGNOSIS — Z1339 Encounter for screening examination for other mental health and behavioral disorders: Secondary | ICD-10-CM | POA: Diagnosis not present

## 2022-08-21 MED ORDER — MEDROXYPROGESTERONE ACETATE 150 MG/ML IM SUSP: 150.0000 mg | INTRAMUSCULAR | 4 refills | Status: DC

## 2022-08-21 MED ORDER — MEDROXYPROGESTERONE ACETATE 150 MG/ML IM SUSP: 150.0000 mg | Freq: Once | INTRAMUSCULAR | Status: DC

## 2022-08-21 NOTE — Progress Notes (Signed)
Annual GYN Needs Depo refills No concerns Desires STI testing today

## 2022-08-21 NOTE — Progress Notes (Addendum)
Subjective:        Karen Robles is a 42 y.o. female here for a routine exam.  Current complaints: Vaginal discharge.    Personal health questionnaire:  Is patient Ashkenazi Jewish, have a family history of breast and/or ovarian cancer: no Is there a family history of uterine cancer diagnosed at age < 87, gastrointestinal cancer, urinary tract cancer, family member who is a Personnel officer syndrome-associated carrier: no Is the patient overweight and hypertensive, family history of diabetes, personal history of gestational diabetes, preeclampsia or PCOS: yes Is patient over 71, have PCOS,  family history of premature CHD under age 15, diabetes, smoke, have hypertension or peripheral artery disease:  no At any time, has a partner hit, kicked or otherwise hurt or frightened you?: no Over the past 2 weeks, have you felt down, depressed or hopeless?: no Over the past 2 weeks, have you felt little interest or pleasure in doing things?:no   Gynecologic History No LMP recorded. Patient has had an injection. Contraception: Depo-Provera injections Last Pap: 2019. Results were: normal Last mammogram: none. Results were: none  Obstetric History OB History  Gravida Para Term Preterm AB Living  2 2 2     2   SAB IAB Ectopic Multiple Live Births          2    # Outcome Date GA Lbr Len/2nd Weight Sex Delivery Anes PTL Lv  2 Term 2012 [redacted]w[redacted]d   F Vag-Spont   LIV  1 Term 2004   6 lb 4 oz (2.835 kg) F Vag-Spont   LIV    Past Medical History:  Diagnosis Date   Hypertension    Obesity    Pulmonary embolism (HCC)    Pulmonary embolism (HCC) 2015    Past Surgical History:  Procedure Laterality Date   MOUTH SURGERY     WISDOM TOOTH EXTRACTION Bilateral      Current Outpatient Medications:    hydrochlorothiazide (HYDRODIURIL) 25 MG tablet, Take 1 tablet by mouth once daily, Disp: 90 tablet, Rfl: 0   medroxyPROGESTERone (DEPO-PROVERA) 150 MG/ML injection, Inject 1 mL (150 mg total) into the muscle  every 3 (three) months., Disp: 1 mL, Rfl: 0   medroxyPROGESTERone (DEPO-PROVERA) 150 MG/ML injection, Inject 1 mL (150 mg total) into the muscle every 3 (three) months., Disp: 1 mL, Rfl: 4   potassium chloride SA (KLOR-CON M) 20 MEQ tablet, Take 2 tablets (40 mEq total) by mouth daily., Disp: 180 tablet, Rfl: 2   Rivaroxaban (XARELTO) 15 MG TABS tablet, Take 1 tablet (15 mg total) by mouth daily., Disp: 30 tablet, Rfl: 3  Current Facility-Administered Medications:    medroxyPROGESTERone (DEPO-PROVERA) injection 150 mg, 150 mg, Intramuscular, Once, Brock Bad, MD No Known Allergies  Social History   Tobacco Use   Smoking status: Never   Smokeless tobacco: Never  Substance Use Topics   Alcohol use: No    Alcohol/week: 0.0 standard drinks of alcohol    Family History  Problem Relation Age of Onset   Hypertension Mother        maternal aunts & uncles   Hyperlipidemia Mother    Healthy Brother    Hypertension Maternal Aunt    Hypertension Maternal Uncle    Cancer Maternal Grandmother    Diabetes Neg Hx    Heart disease Neg Hx       Review of Systems  Constitutional: negative for fatigue and weight loss Respiratory: negative for cough and wheezing Cardiovascular: negative for chest pain, fatigue  and palpitations Gastrointestinal: negative for abdominal pain and change in bowel habits Musculoskeletal:negative for myalgias Neurological: negative for gait problems and tremors Behavioral/Psych: negative for abusive relationship, depression Endocrine: negative for temperature intolerance    Genitourinary: positive for vaginal discharge.  negative for abnormal menstrual periods, genital lesions, hot flashes, sexual problems  Integument/breast: negative for breast lump, breast tenderness, nipple discharge and skin lesion(s)    Objective:       BP 135/87   Pulse 92   Ht 5\' 3"  (1.6 m)   Wt (!) 327 lb 4.8 oz (148.5 kg)   BMI 57.98 kg/m  General:   Alert and no distress   Skin:   no rash or abnormalities  Lungs:   clear to auscultation bilaterally  Heart:   regular rate and rhythm, S1, S2 normal, no murmur, click, rub or gallop  Breasts:   normal without suspicious masses, skin or nipple changes or axillary nodes  Abdomen:  normal findings: no organomegaly, soft, non-tender and no hernia  Pelvis:  External genitalia: normal general appearance Urinary system: urethral meatus normal and bladder without fullness, nontender Vaginal: normal without tenderness, induration or masses Cervix: normal appearance Adnexa: normal bimanual exam Uterus: anteverted and non-tender, normal size   Lab Review Urine pregnancy test Labs reviewed yes Radiologic studies reviewed no  I have spent a total of 20 minutes of face-to-face time, excluding clinical staff time, reviewing notes and preparing to see patient, ordering tests and/or medications, and counseling the patient.   Assessment:    1. Encounter for gynecological examination with Papanicolaou smear of cervix Rx: - Cytology - PAP  2. Vaginal discharge Rx: - Cervicovaginal ancillary only( [place])  3. Screen for STD (sexually transmitted disease) Rx: - Hepatitis B surface antigen - Hepatitis C antibody - HIV Antibody (routine testing w rflx) - RPR  4. Encounter for surveillance of injectable contraceptive Rx: - medroxyPROGESTERone (DEPO-PROVERA) injection 150 mg - medroxyPROGESTERone (DEPO-PROVERA) 150 MG/ML injection; Inject 1 mL (150 mg total) into the muscle every 3 (three) months.  Dispense: 1 mL; Refill: 4  5. Screening breast examination Rx: - MM Digital Screening; Future  6. Class 3 severe obesity with serious comorbidity and body mass index (BMI) of 50.0 to 59.9 in adult, unspecified obesity type (HCC) - weight reduction recommended with the aid of dietary changes and exercise     Plan:    Education reviewed: calcium supplements, depression evaluation, low fat, low cholesterol diet,  safe sex/STD prevention, self breast exams, and weight bearing exercise. Contraception: Depo-Provera injections. Mammogram ordered. Follow up in: 1 year.   Meds ordered this encounter  Medications   medroxyPROGESTERone (DEPO-PROVERA) injection 150 mg   medroxyPROGESTERone (DEPO-PROVERA) 150 MG/ML injection    Sig: Inject 1 mL (150 mg total) into the muscle every 3 (three) months.    Dispense:  1 mL    Refill:  4   Orders Placed This Encounter  Procedures   MM Digital Screening    Standing Status:   Future    Standing Expiration Date:   08/21/2023    Order Specific Question:   Reason for Exam (SYMPTOM  OR DIAGNOSIS REQUIRED)    Answer:   Screening    Order Specific Question:   Is the patient pregnant?    Answer:   No    Order Specific Question:   Preferred imaging location?    Answer:   [facility]   Hepatitis B surface antigen   Hepatitis C antibody   HIV Antibody (  routine testing w rflx)   RPR    Brock Bad, MD 08/21/2022 8:48 AM

## 2022-08-22 LAB — HEPATITIS C ANTIBODY: Hep C Virus Ab: NONREACTIVE

## 2022-08-22 LAB — HEPATITIS B SURFACE ANTIGEN: Hepatitis B Surface Ag: NEGATIVE

## 2022-08-22 LAB — RPR: RPR Ser Ql: NONREACTIVE

## 2022-08-22 LAB — HIV ANTIBODY (ROUTINE TESTING W REFLEX): HIV Screen 4th Generation wRfx: NONREACTIVE

## 2022-08-24 LAB — CERVICOVAGINAL ANCILLARY ONLY
Bacterial Vaginitis (gardnerella): NEGATIVE
Candida Glabrata: NEGATIVE
Candida Vaginitis: POSITIVE — AB
Chlamydia: NEGATIVE
Comment: NEGATIVE
Comment: NEGATIVE
Comment: NEGATIVE
Comment: NEGATIVE
Comment: NEGATIVE
Comment: NORMAL
Neisseria Gonorrhea: NEGATIVE
Trichomonas: NEGATIVE

## 2022-08-25 ENCOUNTER — Encounter: Payer: Self-pay | Admitting: Internal Medicine

## 2022-08-25 ENCOUNTER — Other Ambulatory Visit (HOSPITAL_COMMUNITY): Payer: Self-pay

## 2022-08-25 ENCOUNTER — Other Ambulatory Visit: Payer: Self-pay | Admitting: Obstetrics

## 2022-08-25 ENCOUNTER — Other Ambulatory Visit: Payer: Self-pay

## 2022-08-25 DIAGNOSIS — B379 Candidiasis, unspecified: Secondary | ICD-10-CM

## 2022-08-25 MED ORDER — FLUCONAZOLE 150 MG PO TABS
150.0000 mg | ORAL_TABLET | Freq: Once | ORAL | 0 refills | Status: DC
Start: 2022-08-25 — End: 2022-08-25

## 2022-08-26 ENCOUNTER — Encounter: Payer: Self-pay | Admitting: Pharmacist

## 2022-08-26 ENCOUNTER — Other Ambulatory Visit: Payer: Self-pay

## 2022-08-26 ENCOUNTER — Other Ambulatory Visit (HOSPITAL_COMMUNITY): Payer: Self-pay

## 2022-08-26 LAB — CYTOLOGY - PAP
Comment: NEGATIVE
Diagnosis: NEGATIVE
High risk HPV: NEGATIVE

## 2022-08-26 MED ORDER — FLUCONAZOLE 150 MG PO TABS
150.0000 mg | ORAL_TABLET | Freq: Once | ORAL | 0 refills | Status: AC
Start: 2022-08-26 — End: 2022-08-26
  Filled 2022-08-26: qty 1, 1d supply, fill #0

## 2022-08-28 ENCOUNTER — Other Ambulatory Visit: Payer: Self-pay

## 2022-08-28 ENCOUNTER — Ambulatory Visit (HOSPITAL_BASED_OUTPATIENT_CLINIC_OR_DEPARTMENT_OTHER): Admission: RE | Admit: 2022-08-28 | Payer: BC Managed Care – PPO | Source: Ambulatory Visit | Admitting: Radiology

## 2022-09-03 ENCOUNTER — Inpatient Hospital Stay (HOSPITAL_BASED_OUTPATIENT_CLINIC_OR_DEPARTMENT_OTHER): Admission: RE | Admit: 2022-09-03 | Payer: BC Managed Care – PPO | Source: Ambulatory Visit | Admitting: Radiology

## 2022-09-10 ENCOUNTER — Other Ambulatory Visit: Payer: Self-pay

## 2022-09-11 ENCOUNTER — Encounter: Payer: Self-pay | Admitting: Pharmacist

## 2022-09-11 ENCOUNTER — Other Ambulatory Visit: Payer: Self-pay

## 2022-09-12 ENCOUNTER — Ambulatory Visit (HOSPITAL_BASED_OUTPATIENT_CLINIC_OR_DEPARTMENT_OTHER)
Admission: RE | Admit: 2022-09-12 | Discharge: 2022-09-12 | Disposition: A | Discharge status: Home / Self Care | Payer: BC Managed Care – PPO | Source: Ambulatory Visit | Attending: Obstetrics | Admitting: Obstetrics

## 2022-09-12 DIAGNOSIS — Z1231 Encounter for screening mammogram for malignant neoplasm of breast: Secondary | ICD-10-CM | POA: Diagnosis not present

## 2022-09-12 DIAGNOSIS — Z1239 Encounter for other screening for malignant neoplasm of breast: Secondary | ICD-10-CM

## 2022-09-15 ENCOUNTER — Other Ambulatory Visit (HOSPITAL_COMMUNITY): Payer: Self-pay

## 2022-09-18 ENCOUNTER — Ambulatory Visit: Payer: BC Managed Care – PPO

## 2022-09-25 ENCOUNTER — Ambulatory Visit (INDEPENDENT_AMBULATORY_CARE_PROVIDER_SITE_OTHER): Payer: BC Managed Care – PPO

## 2022-09-25 VITALS — Wt 327.0 lb

## 2022-09-25 DIAGNOSIS — Z3042 Encounter for surveillance of injectable contraceptive: Secondary | ICD-10-CM

## 2022-09-25 MED ORDER — MEDROXYPROGESTERONE ACETATE 150 MG/ML IM SUSY
150.0000 mg | PREFILLED_SYRINGE | Freq: Once | INTRAMUSCULAR | Status: AC
Start: 2022-09-25 — End: 2022-09-25
  Administered 2022-09-25: 150 mg via INTRAMUSCULAR

## 2022-09-25 NOTE — Progress Notes (Signed)
Date last pap: 08/21/2022 Last Depo-Provera: 06/25/2022 Side Effects if any: None Serum HCG indicated? N/A Depo-Provera 150 mg IM given by: Philemon Kingdom, CMA Next appointment due: Sept 6th - 20th, 2024

## 2022-10-15 ENCOUNTER — Ambulatory Visit: Payer: BC Managed Care – PPO | Admitting: Internal Medicine

## 2022-10-15 ENCOUNTER — Other Ambulatory Visit: Payer: Self-pay | Admitting: Oncology

## 2022-10-15 DIAGNOSIS — D72828 Other elevated white blood cell count: Secondary | ICD-10-CM

## 2022-10-15 DIAGNOSIS — D75839 Thrombocytosis, unspecified: Secondary | ICD-10-CM

## 2022-10-15 DIAGNOSIS — D6859 Other primary thrombophilia: Secondary | ICD-10-CM

## 2022-10-15 NOTE — Progress Notes (Deleted)
[facility] Cancer Follow up Visit:  Patient Care Team: Marcine Matar, MD as PCP - General (Internal Medicine)  CHIEF COMPLAINTS/PURPOSE OF CONSULTATION:  HISTORY OF PRESENTING ILLNESS: Karen Robles 42 y.o. female is here because of history of VTE Medical history notable for hypertension, obesity  July 07, 2013: CTPA showed several small left lower lobe pulmonary emboli, nonobstructing.  June 01, 2022 WBC 13.5 hemoglobin 13.7 platelet count 491  July 03 2022:  [facility] Hematology Consult Patient states that had a sudden syncopal episode on the day prior to the diagnosis of PE in 2015.  She was on OCP's at the time of dx and weight was similar to what it is now.  Patient was treated with Xarelto which she has remained on since that event.   She is G2 P2.  No history of VTE related to pregnancy.  Her children are ages 63 and 40.  Children were delivered by [facility]. Patient has been on Depot Provera since May 2012 for birth control.   She is not interested in having more children and would consider tubal ligation.    Social:  Single.  Works at [employer].  No tobacco.  EtOH none  FMH Mother alive 39 HTN Father estranged Brother alive late 6's asthma  WBC 12.2 hemoglobin 12.3 MCV 82 platelet count 457; 58 segs 35 lymphs 4 monos 2 eos 1 basophil Hemoglobin electrophoresis normal adult pattern D-dimer undetectable.  Fibrinogen 678.  INR 1.6 PTT 36 Prothrombin gene mutation negative  Factor V Leiden negative Anticardiolipin antibody negative antibeta 2 glycoprotein negative Ferritin 48 CMP notable for potassium 2.8 creatinine 1.01  July 17 2022:  Scheduled follow up regarding DVT and hypercoagulable state.  Reviewed results of labs with patient. Will decrease Xarelto to 15 mg daily.  Follow up in 3 months  Review of Systems  Constitutional:  Negative for appetite change, chills, fatigue, fever and unexpected weight change.  HENT:   Negative for mouth  sores, nosebleeds and sore throat.   Eyes:  Negative for eye problems and icterus.       Vision changes:  None  Respiratory:  Negative for chest tightness, cough, hemoptysis and wheezing.        DOE walking up stairs  Cardiovascular:  Negative for chest pain, leg swelling and palpitations.       PND:  none Orthopnea:  none  Gastrointestinal:  Negative for abdominal pain, blood in stool, constipation, diarrhea, nausea and vomiting.  Endocrine: Negative for hot flashes.       Cold intolerance:  none Heat intolerance:  none  Genitourinary:  Negative for bladder incontinence, difficulty urinating, dysuria, frequency, hematuria and nocturia.   Musculoskeletal:  Negative for back pain, gait problem, myalgias, neck pain and neck stiffness.       Arthralgias left foot  Skin:  Negative for itching, rash and wound.  Neurological:  Negative for dizziness, gait problem, headaches, light-headedness and numbness.  Hematological:  Negative for adenopathy. Does not bruise/bleed easily.  Psychiatric/Behavioral:  Negative for sleep disturbance and suicidal ideas. The patient is not nervous/anxious.     MEDICAL HISTORY: Past Medical History:  Diagnosis Date  . Hypertension   . Obesity   . Pulmonary embolism (HCC)   . Pulmonary embolism (HCC) 2015    SURGICAL HISTORY: Past Surgical History:  Procedure Laterality Date  . MOUTH SURGERY    . WISDOM TOOTH EXTRACTION Bilateral     SOCIAL HISTORY: Social History   Socioeconomic History  .  Marital status: Single    Spouse name: Not on file  . Number of children: 2  . Years of education: Not on file  . Highest education level: Not on file  Occupational History  . Occupation: Unemployed  Tobacco Use  . Smoking status: Never  . Smokeless tobacco: Never  Vaping Use  . Vaping status: Never Used  Substance and Sexual Activity  . Alcohol use: No    Alcohol/week: 0.0 standard drinks of alcohol  . Drug use: No  . Sexual activity: Yes     Partners: Male    Birth control/protection: Injection  Other Topics Concern  . Not on file  Social History Narrative  . Not on file   Social Determinants of Health   Financial Resource Strain: Not on file  Food Insecurity: No Food Insecurity (07/03/2022)   Hunger Vital Sign   . Worried About Programme researcher, broadcasting/film/video in the Last Year: Never true   . Ran Out of Food in the Last Year: Never true  Transportation Needs: No Transportation Needs (07/03/2022)   PRAPARE - Transportation   . Lack of Transportation (Medical): No   . Lack of Transportation (Non-Medical): No  Physical Activity: Not on file  Stress: Not on file  Social Connections: Not on file  Intimate Partner Violence: Not At Risk (07/03/2022)   Humiliation, Afraid, Rape, and Kick questionnaire   . Fear of Current or Ex-Partner: No   . Emotionally Abused: No   . Physically Abused: No   . Sexually Abused: No    FAMILY HISTORY Family History  Problem Relation Age of Onset  . Hypertension Mother        maternal aunts & uncles  . Hyperlipidemia Mother   . Healthy Brother   . Hypertension Maternal Aunt   . Hypertension Maternal Uncle   . Cancer Maternal Grandmother   . Diabetes Neg Hx   . Heart disease Neg Hx     ALLERGIES:  has No Known Allergies.  MEDICATIONS:  Current Outpatient Medications  Medication Sig Dispense Refill  . hydrochlorothiazide (HYDRODIURIL) 25 MG tablet Take 1 tablet by mouth once daily 90 tablet 0  . medroxyPROGESTERone (DEPO-PROVERA) 150 MG/ML injection Inject 1 mL (150 mg total) into the muscle every 3 (three) months. 1 mL 0  . medroxyPROGESTERone (DEPO-PROVERA) 150 MG/ML injection Inject 1 mL (150 mg total) into the muscle every 3 (three) months. 1 mL 4  . potassium chloride SA (KLOR-CON M) 20 MEQ tablet Take 2 tablets (40 mEq total) by mouth daily. 180 tablet 2  . Rivaroxaban (XARELTO) 15 MG TABS tablet Take 1 tablet (15 mg total) by mouth daily. 30 tablet 3   Current Facility-Administered  Medications  Medication Dose Route Frequency Provider Last Rate Last Admin  . medroxyPROGESTERone (DEPO-PROVERA) injection 150 mg  150 mg Intramuscular Once Brock Bad, MD        PHYSICAL EXAMINATION:  ECOG PERFORMANCE STATUS: 1 - Symptomatic but completely ambulatory   There were no vitals filed for this visit.   There were no vitals filed for this visit.    Physical Exam Vitals and nursing note reviewed.  Constitutional:      General: She is not in acute distress.    Appearance: Normal appearance. She is obese. She is not ill-appearing, toxic-appearing or diaphoretic.  HENT:     Head: Normocephalic and atraumatic.     Right Ear: External ear normal.     Left Ear: External ear normal.  Nose: Nose normal. No congestion or rhinorrhea.  Eyes:     General: No scleral icterus.    Extraocular Movements: Extraocular movements intact.     Conjunctiva/sclera: Conjunctivae normal.     Pupils: Pupils are equal, round, and reactive to light.  Cardiovascular:     Rate and Rhythm: Normal rate and regular rhythm.     Heart sounds: Normal heart sounds. No murmur heard.    No friction rub. No gallop.  Pulmonary:     Effort: Pulmonary effort is normal. No respiratory distress.     Breath sounds: Normal breath sounds. No wheezing.  Abdominal:     General: Bowel sounds are normal.     Palpations: Abdomen is soft.     Tenderness: There is no abdominal tenderness. There is no guarding or rebound.  Musculoskeletal:        General: No swelling, tenderness or deformity.     Cervical back: Normal range of motion and neck supple. No rigidity or tenderness.     Right lower leg: No edema.     Left lower leg: No edema.  Lymphadenopathy:     Head:     Right side of head: No submental, submandibular, tonsillar, preauricular, posterior auricular or occipital adenopathy.     Left side of head: No submental, submandibular, tonsillar, preauricular, posterior auricular or occipital  adenopathy.     Cervical: No cervical adenopathy.     Right cervical: No superficial, deep or posterior cervical adenopathy.    Left cervical: No superficial, deep or posterior cervical adenopathy.     Upper Body:     Right upper body: No supraclavicular, axillary, pectoral or epitrochlear adenopathy.     Left upper body: No supraclavicular, axillary, pectoral or epitrochlear adenopathy.  Skin:    General: Skin is warm.     Coloration: Skin is not jaundiced.  Neurological:     General: No focal deficit present.     Mental Status: She is alert and oriented to person, place, and time.     Cranial Nerves: No cranial nerve deficit.     Motor: No weakness.  Psychiatric:        Mood and Affect: Mood normal.        Behavior: Behavior normal.        Thought Content: Thought content normal.        Judgment: Judgment normal.     LABORATORY DATA: I have personally reviewed the data as listed:  No visits with results within 1 Month(s) from this visit.  Latest known visit with results is:  Office Visit on 08/21/2022  Component Date Value Ref Range Status  . High risk HPV 08/21/2022 Negative   Final  . Adequacy 08/21/2022 Satisfactory for evaluation; transformation zone component PRESENT.   Final  . Diagnosis 08/21/2022 - Negative for intraepithelial lesion or malignancy (NILM)   Final  . Comment 08/21/2022 Normal Reference Range HPV - Negative   Final  . Neisseria Gonorrhea 08/21/2022 Negative   Final  . Chlamydia 08/21/2022 Negative   Final  . Trichomonas 08/21/2022 Negative   Final  . Bacterial Vaginitis (gardnerella) 08/21/2022 Negative   Final  . Candida Vaginitis 08/21/2022 Positive (A)   Final  . Candida Glabrata 08/21/2022 Negative   Final  . Comment 08/21/2022 Normal Reference Range Bacterial Vaginosis - Negative   Final  . Comment 08/21/2022 Normal Reference Range Candida Species - Negative   Final  . Comment 08/21/2022 Normal Reference Range Candida Galbrata - Negative   Final   .  Comment 08/21/2022 Normal Reference Range Trichomonas - Negative   Final  . Comment 08/21/2022 Normal Reference Ranger Chlamydia - Negative   Final  . Comment 08/21/2022 Normal Reference Range Neisseria Gonorrhea - Negative   Final  . Hepatitis B Surface Ag 08/21/2022 Negative  Negative Final  . Hep C Virus Ab 08/21/2022 Non Reactive  Non Reactive Final   Comment: HCV antibody alone does not differentiate between previously resolved infection and active infection. Equivocal and Reactive HCV antibody results should be followed up with an HCV RNA test to support the diagnosis of active HCV infection.   Marland Kitchen HIV Screen 4th Generation wRfx 08/21/2022 Non Reactive  Non Reactive Final   Comment: HIV-1/HIV-2 antibodies and HIV-1 p24 antigen were NOT detected. There is no laboratory evidence of HIV infection. HIV Negative   . RPR Ser Ql 08/21/2022 Non Reactive  Non Reactive Final    RADIOGRAPHIC STUDIES: I have personally reviewed the radiological images as listed and agree with the findings in the report  No results found.  ASSESSMENT/PLAN   42 y.o. female is here because of history of VTE.  Medical history notable for hypertension, obesity   Pulmonary embolism             July 07, 2013: CTPA showed several small left lower lobe pulmonary emboli, nonobstructing.   July 17 2022- D dimer <0.27.  Decreasing Xarelto to 15 mg po daily   VTE risk factors             July 03 2022- Patient has two identifiable risk factors- Obesity and OCP use.    July 17 2022- Since patient not interested in having additional children, consideration of tubal ligation instead of OCP's may be reasonable.     Hypercoagulable state evaluation July 03 2022- Factor V Leiden Gene, Prothrombin Gene, anti-beta2 glycolipoprotein, anticardiolipan antibody, Hgb electrophoresis normal.  normal Because of anticoagulation with a direct thrombin inhibitor can not obtain Protein C, Protein S, ATIII activities or Lupus  Anticoagulant as those are interfered with by these agents.          Granulocytosis Possible causes in this patient Acute infections Bacterial Various pyogenic cocci, Escherichia coli, Francisella tularensis   Spirochaetal Syphilis, Leptospirosis   Rickettsial Rocky Mountain spotted fever   Chlamydial psittacosis   Mycotic actinomycosis, coccidioidomycosis  Acute Inflammation not caused by infection   RA, rheumatic fever, vasculitis, myositis, hypersensitivity reactions.    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      Cancer Staging  No matching staging information was found for the patient.    No problem-specific Assessment & Plan notes found for this encounter.    No orders of the defined types were placed in this encounter.   40  minutes was spent in patient care.  This included time spent preparing to see the patient (e.g., review of tests), obtaining and/or reviewing separately obtained history, counseling and educating the patient/family/caregiver, ordering medications, tests, or procedures; documenting clinical information in the electronic or other health record, independently interpreting results and communicating results to the patient/family/caregiver as well as coordination of care.       All questions were answered. The patient knows to call the clinic with any problems, questions or concerns.  This note was electronically signed.    Loni Muse, MD  10/15/2022 12:29 PM

## 2022-10-16 ENCOUNTER — Inpatient Hospital Stay: Payer: BC Managed Care – PPO | Attending: Oncology

## 2022-10-16 ENCOUNTER — Inpatient Hospital Stay: Payer: BC Managed Care – PPO | Admitting: Oncology

## 2022-10-20 ENCOUNTER — Other Ambulatory Visit: Payer: Self-pay

## 2022-10-20 ENCOUNTER — Encounter: Payer: Self-pay | Admitting: Pharmacist

## 2022-10-20 ENCOUNTER — Telehealth: Payer: Self-pay | Admitting: Oncology

## 2022-10-20 NOTE — Telephone Encounter (Signed)
 Left patient a vm regarding upcoming appointment

## 2022-10-23 ENCOUNTER — Other Ambulatory Visit: Payer: Self-pay

## 2022-10-23 ENCOUNTER — Ambulatory Visit: Payer: BC Managed Care – PPO | Admitting: Internal Medicine

## 2022-11-05 ENCOUNTER — Other Ambulatory Visit: Payer: Self-pay | Admitting: Oncology

## 2022-11-05 DIAGNOSIS — D75839 Thrombocytosis, unspecified: Secondary | ICD-10-CM

## 2022-11-05 DIAGNOSIS — Z86711 Personal history of pulmonary embolism: Secondary | ICD-10-CM

## 2022-11-05 NOTE — Progress Notes (Deleted)
[facility] Cancer Follow up Visit:  Patient Care Team: Marcine Matar, MD as PCP - General (Internal Medicine)  CHIEF COMPLAINTS/PURPOSE OF CONSULTATION:  HISTORY OF PRESENTING ILLNESS: SISTER SCHLAGETER 42 y.o. female is here because of history of VTE Medical history notable for hypertension, obesity  July 07, 2013: CTPA showed several small left lower lobe pulmonary emboli, nonobstructing.  June 01, 2022 WBC 13.5 hemoglobin 13.7 platelet count 491  July 03 2022:  [facility] Hematology Consult Patient states that had a sudden syncopal episode on the day prior to the diagnosis of PE in 2015.  She was on OCP's at the time of dx and weight was similar to what it is now.  Patient was treated with Xarelto which she has remained on since that event.   She is G2 P2.  No history of VTE related to pregnancy.  Her children are ages 73 and 23.  Children were delivered by [facility]. Patient has been on Depot Provera since May 2012 for birth control.   She is not interested in having more children and would consider tubal ligation.    Social:  Single.  Works at [employer].  No tobacco.  EtOH none  FMH Mother alive 75 HTN Father estranged Brother alive late 64's asthma  WBC 12.2 hemoglobin 12.3 MCV 82 platelet count 457; 58 segs 35 lymphs 4 monos 2 eos 1 basophil Hemoglobin electrophoresis normal adult pattern D-dimer undetectable.  Fibrinogen 678.  INR 1.6 PTT 36 Prothrombin gene mutation negative  Factor V Leiden negative Anticardiolipin antibody negative antibeta 2 glycoprotein negative Ferritin 48 CMP notable for potassium 2.8 creatinine 1.01  July 17 2022:  Scheduled follow up regarding DVT and hypercoagulable state.  Reviewed results of labs with patient. Will decrease Xarelto to 15 mg daily.  Follow up in 3 months  Review of Systems  Constitutional:  Negative for appetite change, chills, fatigue, fever and unexpected weight change.  HENT:   Negative for mouth  sores, nosebleeds and sore throat.   Eyes:  Negative for eye problems and icterus.       Vision changes:  None  Respiratory:  Negative for chest tightness, cough, hemoptysis and wheezing.        DOE walking up stairs  Cardiovascular:  Negative for chest pain, leg swelling and palpitations.       PND:  none Orthopnea:  none  Gastrointestinal:  Negative for abdominal pain, blood in stool, constipation, diarrhea, nausea and vomiting.  Endocrine: Negative for hot flashes.       Cold intolerance:  none Heat intolerance:  none  Genitourinary:  Negative for bladder incontinence, difficulty urinating, dysuria, frequency, hematuria and nocturia.   Musculoskeletal:  Negative for back pain, gait problem, myalgias, neck pain and neck stiffness.       Arthralgias left foot  Skin:  Negative for itching, rash and wound.  Neurological:  Negative for dizziness, gait problem, headaches, light-headedness and numbness.  Hematological:  Negative for adenopathy. Does not bruise/bleed easily.  Psychiatric/Behavioral:  Negative for sleep disturbance and suicidal ideas. The patient is not nervous/anxious.     MEDICAL HISTORY: Past Medical History:  Diagnosis Date   Hypertension    Obesity    Pulmonary embolism (HCC)    Pulmonary embolism (HCC) 2015    SURGICAL HISTORY: Past Surgical History:  Procedure Laterality Date   MOUTH SURGERY     WISDOM TOOTH EXTRACTION Bilateral     SOCIAL HISTORY: Social History   Socioeconomic History  Marital status: Single    Spouse name: Not on file   Number of children: 2   Years of education: Not on file   Highest education level: Not on file  Occupational History   Occupation: Unemployed  Tobacco Use   Smoking status: Never   Smokeless tobacco: Never  Vaping Use   Vaping status: Never Used  Substance and Sexual Activity   Alcohol use: No    Alcohol/week: 0.0 standard drinks of alcohol   Drug use: No   Sexual activity: Yes    Partners: Male    Birth  control/protection: Injection  Other Topics Concern   Not on file  Social History Narrative   Not on file   Social Determinants of Health   Financial Resource Strain: Not on file  Food Insecurity: No Food Insecurity (07/03/2022)   Hunger Vital Sign    Worried About Running Out of Food in the Last Year: Never true    Ran Out of Food in the Last Year: Never true  Transportation Needs: No Transportation Needs (07/03/2022)   PRAPARE - Administrator, Civil Service (Medical): No    Lack of Transportation (Non-Medical): No  Physical Activity: Not on file  Stress: Not on file  Social Connections: Not on file  Intimate Partner Violence: Not At Risk (07/03/2022)   Humiliation, Afraid, Rape, and Kick questionnaire    Fear of Current or Ex-Partner: No    Emotionally Abused: No    Physically Abused: No    Sexually Abused: No    FAMILY HISTORY Family History  Problem Relation Age of Onset   Hypertension Mother        maternal aunts & uncles   Hyperlipidemia Mother    Healthy Brother    Hypertension Maternal Aunt    Hypertension Maternal Uncle    Cancer Maternal Grandmother    Diabetes Neg Hx    Heart disease Neg Hx     ALLERGIES:  has No Known Allergies.  MEDICATIONS:  Current Outpatient Medications  Medication Sig Dispense Refill   hydrochlorothiazide (HYDRODIURIL) 25 MG tablet Take 1 tablet by mouth once daily 90 tablet 0   medroxyPROGESTERone (DEPO-PROVERA) 150 MG/ML injection Inject 1 mL (150 mg total) into the muscle every 3 (three) months. 1 mL 0   medroxyPROGESTERone (DEPO-PROVERA) 150 MG/ML injection Inject 1 mL (150 mg total) into the muscle every 3 (three) months. 1 mL 4   potassium chloride SA (KLOR-CON M) 20 MEQ tablet Take 2 tablets (40 mEq total) by mouth daily. 180 tablet 2   Rivaroxaban (XARELTO) 15 MG TABS tablet Take 1 tablet (15 mg total) by mouth daily. 30 tablet 3   Current Facility-Administered Medications  Medication Dose Route Frequency  Provider Last Rate Last Admin   medroxyPROGESTERone (DEPO-PROVERA) injection 150 mg  150 mg Intramuscular Once Brock Bad, MD        PHYSICAL EXAMINATION:  ECOG PERFORMANCE STATUS: 1 - Symptomatic but completely ambulatory   There were no vitals filed for this visit.   There were no vitals filed for this visit.    Physical Exam Vitals and nursing note reviewed.  Constitutional:      General: She is not in acute distress.    Appearance: Normal appearance. She is obese. She is not ill-appearing, toxic-appearing or diaphoretic.  HENT:     Head: Normocephalic and atraumatic.     Right Ear: External ear normal.     Left Ear: External ear normal.  Nose: Nose normal. No congestion or rhinorrhea.  Eyes:     General: No scleral icterus.    Extraocular Movements: Extraocular movements intact.     Conjunctiva/sclera: Conjunctivae normal.     Pupils: Pupils are equal, round, and reactive to light.  Cardiovascular:     Rate and Rhythm: Normal rate and regular rhythm.     Heart sounds: Normal heart sounds. No murmur heard.    No friction rub. No gallop.  Pulmonary:     Effort: Pulmonary effort is normal. No respiratory distress.     Breath sounds: Normal breath sounds. No wheezing.  Abdominal:     General: Bowel sounds are normal.     Palpations: Abdomen is soft.     Tenderness: There is no abdominal tenderness. There is no guarding or rebound.  Musculoskeletal:        General: No swelling, tenderness or deformity.     Cervical back: Normal range of motion and neck supple. No rigidity or tenderness.     Right lower leg: No edema.     Left lower leg: No edema.  Lymphadenopathy:     Head:     Right side of head: No submental, submandibular, tonsillar, preauricular, posterior auricular or occipital adenopathy.     Left side of head: No submental, submandibular, tonsillar, preauricular, posterior auricular or occipital adenopathy.     Cervical: No cervical adenopathy.      Right cervical: No superficial, deep or posterior cervical adenopathy.    Left cervical: No superficial, deep or posterior cervical adenopathy.     Upper Body:     Right upper body: No supraclavicular, axillary, pectoral or epitrochlear adenopathy.     Left upper body: No supraclavicular, axillary, pectoral or epitrochlear adenopathy.  Skin:    General: Skin is warm.     Coloration: Skin is not jaundiced.  Neurological:     General: No focal deficit present.     Mental Status: She is alert and oriented to person, place, and time.     Cranial Nerves: No cranial nerve deficit.     Motor: No weakness.  Psychiatric:        Mood and Affect: Mood normal.        Behavior: Behavior normal.        Thought Content: Thought content normal.        Judgment: Judgment normal.      LABORATORY DATA: I have personally reviewed the data as listed:  No visits with results within 1 Month(s) from this visit.  Latest known visit with results is:  Office Visit on 08/21/2022  Component Date Value Ref Range Status   High risk HPV 08/21/2022 Negative   Final   Adequacy 08/21/2022 Satisfactory for evaluation; transformation zone component PRESENT.   Final   Diagnosis 08/21/2022 - Negative for intraepithelial lesion or malignancy (NILM)   Final   Comment 08/21/2022 Normal Reference Range HPV - Negative   Final   Neisseria Gonorrhea 08/21/2022 Negative   Final   Chlamydia 08/21/2022 Negative   Final   Trichomonas 08/21/2022 Negative   Final   Bacterial Vaginitis (gardnerella) 08/21/2022 Negative   Final   Candida Vaginitis 08/21/2022 Positive (A)   Final   Candida Glabrata 08/21/2022 Negative   Final   Comment 08/21/2022 Normal Reference Range Bacterial Vaginosis - Negative   Final   Comment 08/21/2022 Normal Reference Range Candida Species - Negative   Final   Comment 08/21/2022 Normal Reference Range Candida Galbrata - Negative  Final   Comment 08/21/2022 Normal Reference Range Trichomonas - Negative    Final   Comment 08/21/2022 Normal Reference Ranger Chlamydia - Negative   Final   Comment 08/21/2022 Normal Reference Range Neisseria Gonorrhea - Negative   Final   Hepatitis B Surface Ag 08/21/2022 Negative  Negative Final   Hep C Virus Ab 08/21/2022 Non Reactive  Non Reactive Final   Comment: HCV antibody alone does not differentiate between previously resolved infection and active infection. Equivocal and Reactive HCV antibody results should be followed up with an HCV RNA test to support the diagnosis of active HCV infection.    HIV Screen 4th Generation wRfx 08/21/2022 Non Reactive  Non Reactive Final   Comment: HIV-1/HIV-2 antibodies and HIV-1 p24 antigen were NOT detected. There is no laboratory evidence of HIV infection. HIV Negative    RPR Ser Ql 08/21/2022 Non Reactive  Non Reactive Final    RADIOGRAPHIC STUDIES: I have personally reviewed the radiological images as listed and agree with the findings in the report  No results found.  ASSESSMENT/PLAN   42 y.o. female is here because of history of VTE.  Medical history notable for hypertension, obesity   Pulmonary embolism             July 07, 2013: CTPA showed several small left lower lobe pulmonary emboli, nonobstructing.   July 17 2022- D dimer <0.27.  Decreasing Xarelto to 15 mg po daily   VTE risk factors             July 03 2022- Patient has two identifiable risk factors- Obesity and OCP use.    July 17 2022- Since patient not interested in having additional children, consideration of tubal ligation instead of OCP's may be reasonable.     Hypercoagulable state evaluation July 03 2022- Factor V Leiden Gene, Prothrombin Gene, anti-beta2 glycolipoprotein, anticardiolipan antibody, Hgb electrophoresis normal.  normal Because of anticoagulation with a direct thrombin inhibitor can not obtain Protein C, Protein S, ATIII activities or Lupus Anticoagulant as those are interfered with by these agents.           Granulocytosis Possible causes in this patient Acute infections Bacterial Various pyogenic cocci, Escherichia coli, Francisella tularensis   Spirochaetal Syphilis, Leptospirosis   Rickettsial [place] spotted fever   Chlamydial psittacosis   Mycotic actinomycosis, coccidioidomycosis  Acute Inflammation not caused by infection   RA, rheumatic fever, vasculitis, myositis, hypersensitivity reactions.    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      Cancer Staging  No matching staging information was found for the patient.    No problem-specific Assessment & Plan notes found for this encounter.    No orders of the defined types were placed in this encounter.   40  minutes was spent in patient care.  This included time spent preparing to see the patient (e.g., review of tests), obtaining and/or reviewing separately obtained history, counseling and educating the patient/family/caregiver, ordering medications, tests, or procedures; documenting clinical information in the electronic or other health record, independently interpreting results and communicating results to the patient/family/caregiver as well as coordination of care.       All questions were answered. The patient knows to call the clinic with any  problems, questions or concerns.  This note was electronically signed.    Loni Muse, MD  11/05/2022 2:36 PM

## 2022-11-06 ENCOUNTER — Encounter: Payer: Self-pay | Admitting: Internal Medicine

## 2022-11-06 ENCOUNTER — Inpatient Hospital Stay: Payer: BC Managed Care – PPO | Attending: Oncology

## 2022-11-06 ENCOUNTER — Inpatient Hospital Stay: Payer: BC Managed Care – PPO | Admitting: Oncology

## 2022-11-09 ENCOUNTER — Other Ambulatory Visit: Payer: Self-pay | Admitting: Internal Medicine

## 2022-11-09 ENCOUNTER — Other Ambulatory Visit: Payer: Self-pay

## 2022-11-09 MED ORDER — WEGOVY 0.25 MG/0.5ML ~~LOC~~ SOAJ
0.2500 mg | SUBCUTANEOUS | 0 refills | Status: DC
Start: 2022-11-09 — End: 2022-12-11

## 2022-11-10 ENCOUNTER — Telehealth: Payer: Self-pay

## 2022-11-10 ENCOUNTER — Other Ambulatory Visit: Payer: Self-pay

## 2022-11-10 NOTE — Telephone Encounter (Signed)
A prior authorization request for Karen Robles has been submitted to [employer](managed care Medicaid) today via CoverMyMeds Key: [id number]

## 2022-11-11 ENCOUNTER — Encounter: Payer: Self-pay | Admitting: Internal Medicine

## 2022-11-21 ENCOUNTER — Other Ambulatory Visit: Payer: Self-pay | Admitting: Internal Medicine

## 2022-11-21 DIAGNOSIS — I10 Essential (primary) hypertension: Secondary | ICD-10-CM

## 2022-11-21 MED ORDER — HYDROCHLOROTHIAZIDE 25 MG PO TABS
25.0000 mg | ORAL_TABLET | Freq: Every day | ORAL | 0 refills | Status: DC
Start: 2022-11-21 — End: 2023-02-03
  Filled 2022-11-21: qty 30, 30d supply, fill #0
  Filled 2022-12-18: qty 30, 30d supply, fill #1
  Filled 2023-01-13: qty 30, 30d supply, fill #2

## 2022-11-22 ENCOUNTER — Other Ambulatory Visit: Payer: Self-pay | Admitting: Internal Medicine

## 2022-11-22 ENCOUNTER — Other Ambulatory Visit (HOSPITAL_COMMUNITY): Payer: Self-pay

## 2022-11-22 DIAGNOSIS — I10 Essential (primary) hypertension: Secondary | ICD-10-CM

## 2022-11-23 ENCOUNTER — Other Ambulatory Visit: Payer: Self-pay

## 2022-11-24 NOTE — Telephone Encounter (Signed)
Patient returned call- she wants to used [facility] outpt pharmacy- will decline this RF request Requested Prescriptions  Pending Prescriptions Disp Refills   hydrochlorothiazide (HYDRODIURIL) 25 MG tablet [Pharmacy Med Name: hydroCHLOROthiazide 25 MG Oral Tablet] 90 tablet 0    Sig: Take 1 tablet by mouth once daily     Cardiovascular: Diuretics - Thiazide Failed - 11/22/2022  9:51 AM      Failed - Cr in normal range and within 180 days    Creatinine  Date Value Ref Range Status  07/03/2022 1.01 (H) 0.44 - 1.00 mg/dL Final   Creat  Date Value Ref Range Status  04/14/2016 1.09 0.50 - 1.10 mg/dL Final         Passed - K in normal range and within 180 days    Potassium  Date Value Ref Range Status  07/14/2022 3.5 3.5 - 5.2 mmol/L Final         Passed - Na in normal range and within 180 days    Sodium  Date Value Ref Range Status  07/03/2022 138 135 - 145 mmol/L Final  06/01/2022 141 134 - 144 mmol/L Final         Passed - Last BP in normal range    BP Readings from Last 1 Encounters:  08/21/22 135/87         Passed - Valid encounter within last 6 months    Recent Outpatient Visits           5 months ago Essential hypertension   [place] [facility] Marcine Matar, MD   1 year ago Essential hypertension   [place] [facility] Marcine Matar, MD   1 year ago Essential hypertension   [place] [facility] Marcine Matar, MD   2 years ago On continuous oral anticoagulation    [facility] Marcine Matar, MD   2 years ago Influenza vaccine needed   [facility] Drucilla Chalet, RPH-CPP       Future Appointments             In 2 months Laural Benes, Binnie Rail, MD [facility]

## 2022-11-24 NOTE — Telephone Encounter (Signed)
Attempted to call patient-( need to confirm Rx needed)- request from different pharmacy- need to confirm pharmacy LM to [facility]

## 2022-12-11 ENCOUNTER — Ambulatory Visit: Payer: BC Managed Care – PPO | Attending: Internal Medicine | Admitting: Internal Medicine

## 2022-12-11 VITALS — BP 122/83 | HR 100 | Ht 63.0 in | Wt 318.0 lb

## 2022-12-11 DIAGNOSIS — Z23 Encounter for immunization: Secondary | ICD-10-CM

## 2022-12-11 DIAGNOSIS — D75839 Thrombocytosis, unspecified: Secondary | ICD-10-CM

## 2022-12-11 DIAGNOSIS — D72829 Elevated white blood cell count, unspecified: Secondary | ICD-10-CM | POA: Diagnosis not present

## 2022-12-11 DIAGNOSIS — Z6841 Body Mass Index (BMI) 40.0 and over, adult: Secondary | ICD-10-CM

## 2022-12-11 DIAGNOSIS — R7303 Prediabetes: Secondary | ICD-10-CM

## 2022-12-11 DIAGNOSIS — I10 Essential (primary) hypertension: Secondary | ICD-10-CM

## 2022-12-11 LAB — POCT GLYCOSYLATED HEMOGLOBIN (HGB A1C): HbA1c, POC (prediabetic range): 5.4 % — AB (ref 5.7–6.4)

## 2022-12-11 MED ORDER — WEGOVY 0.25 MG/0.5ML ~~LOC~~ SOAJ
0.2500 mg | SUBCUTANEOUS | 0 refills | Status: DC
Start: 2022-12-11 — End: 2023-01-13

## 2022-12-11 NOTE — Progress Notes (Signed)
Patient ID: Karen Robles, female    DOB: 11-Jan-1981  MRN: [id number]  CC: Weight Management Screening (No questions or concerns.)   Subjective: Karen Robles is a 42 y.o. female who presents for chronic ds management. 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   Morbid obesity/prediabetes:  Results for orders placed or performed in visit on 12/11/22  POCT glycosylated hemoglobin (Hb A1C)  Result Value Ref Range   Hemoglobin A1C     HbA1c POC (<> result, manual entry)     HbA1c, POC (prediabetic range) 5.4 (A) 5.7 - 6.4 %   HbA1c, POC (controlled diabetic range)    On last visit we had discussed starting her on Wegovy but it was not covered by her insurance at that time.  Patient contacted me last month stating that it is now covered by her insurance.  However her plan at that time required chart notes dated within the last 45 days as part of the prior approval process. 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  Works 4 days. -making changes with eating habits.  Eat BF, snack on crackers during the day then eat again once she gets off work. -saw nutritionist in past but long time ago.   -down 6 lbs since I last saw her 05/2022. -on Depo through her GYN since 2012. Reports she was already heavy when she got on Depo.  HTN: On HCTZ 25 mg daily.  Taking the potassium supplement with it. Limits salt in foods.  No checking BP  Hx of PE:  taking and tolerating Xarelto.  No bruising or bleeding. Referred to [facility], hematology/oncology for persistent leukocytosis and thrombocytosis.  He did anticoagulant workup which was negative but some of the levels were expected to be negative given that she is on Xarelto.  Looks like JAK2 ordered but patient has not had this done.  No showed appointment with them in July and last month.  Reports transportation issues.  Plans to  reschedule.   Patient Active Problem List   Diagnosis Date Noted   Primary hypercoagulable state (HCC) 08/03/2022   Chronic anticoagulation 08/03/2022   Granulocytosis 07/16/2022   Thrombocytosis 08/18/2021   Leukocytosis 08/18/2021   Influenza vaccine needed 04/16/2020   COVID-19 vaccine series completed 04/16/2020   History of pulmonary embolism 07/22/2017   Depo-Provera contraceptive status 06/11/2016   Severe obesity (BMI >= 40) (HCC) 07/26/2013   Essential hypertension 03/07/2013     Current Outpatient Medications on File Prior to Visit  Medication Sig Dispense Refill   hydrochlorothiazide (HYDRODIURIL) 25 MG tablet Take 1 tablet (25 mg total) by mouth daily. 90 tablet 0   medroxyPROGESTERone (DEPO-PROVERA) 150 MG/ML injection Inject 1 mL (150 mg total) into the muscle every 3 (three) months. 1 mL 4   potassium chloride SA (KLOR-CON M) 20 MEQ tablet Take 2 tablets (40 mEq total) by mouth daily. 180 tablet 2   Rivaroxaban (XARELTO) 15 MG TABS tablet Take 1 tablet (15 mg total) by mouth daily. 30 tablet 3   Current Facility-Administered Medications on File Prior to Visit  Medication Dose Route Frequency Provider Last Rate Last Admin   medroxyPROGESTERone (DEPO-PROVERA) injection 150 mg  150 mg Intramuscular Once Brock Bad, MD        No Known Allergies  Social History   Socioeconomic History   Marital status: Single    Spouse  name: Not on file   Number of children: 2   Years of education: Not on file   Highest education level: 12th grade  Occupational History   Occupation: Unemployed  Tobacco Use   Smoking status: Never   Smokeless tobacco: Never  Vaping Use   Vaping status: Never Used  Substance and Sexual Activity   Alcohol use: No    Alcohol/week: 0.0 standard drinks of alcohol   Drug use: No   Sexual activity: Yes    Partners: Male    Birth control/protection: Injection  Other Topics Concern   Not on file  Social History Narrative   Not on file    Social Determinants of Health   Financial Resource Strain: Patient Declined (12/11/2022)   Overall Financial Resource Strain (CARDIA)    Difficulty of Paying Living Expenses: Patient declined  Food Insecurity: Patient Declined (12/11/2022)   Hunger Vital Sign    Worried About Running Out of Food in the Last Year: Patient declined    Ran Out of Food in the Last Year: Patient declined  Transportation Needs: Patient Declined (12/11/2022)   PRAPARE - Administrator, Civil Service (Medical): Patient declined    Lack of Transportation (Non-Medical): Patient declined  Physical Activity: Unknown (12/11/2022)   Exercise Vital Sign    Days of Exercise per Week: Patient declined    Minutes of Exercise per Session: Not on file  Stress: Patient Declined (12/11/2022)   [employer] of Occupational Health - Occupational Stress Questionnaire    Feeling of Stress : Patient declined  Social Connections: Unknown (12/11/2022)   Social Connection and Isolation Panel [NHANES]    Frequency of Communication with Friends and Family: More than three times a week    Frequency of Social Gatherings with Friends and Family: Patient declined    Attends Religious Services: 1 to 4 times per year    Active Member of [employer] or Organizations: No    Attends Banker Meetings: Not on file    Marital Status: Not on file  Intimate Partner Violence: Not At Risk (07/03/2022)   Humiliation, Afraid, Rape, and Kick questionnaire    Fear of Current or Ex-Partner: No    Emotionally Abused: No    Physically Abused: No    Sexually Abused: No    Family History  Problem Relation Age of Onset   Hypertension Mother        maternal aunts & uncles   Hyperlipidemia Mother    Healthy Brother    Hypertension Maternal Aunt    Hypertension Maternal Uncle    Cancer Maternal Grandmother    Diabetes Neg Hx    Heart disease Neg Hx     Past Surgical History:  Procedure Laterality Date   MOUTH SURGERY     WISDOM  TOOTH EXTRACTION Bilateral     ROS: Review of Systems Negative except as stated above  PHYSICAL EXAM: BP 122/83 (BP Location: Left Wrist, Patient Position: Sitting, Cuff Size: Large)   Pulse 100   Ht 5\' 3"  (1.6 m)   Wt (!) 318 lb (144.2 kg)   SpO2 99%   BMI 56.33 kg/m   Wt Readings from Last 3 Encounters:  12/11/22 (!) 318 lb (144.2 kg)  09/25/22 (!) 327 lb (148.3 kg)  08/21/22 (!) 327 lb 4.8 oz (148.5 kg)    Physical Exam  General appearance - alert, well appearing, morbidly obese AAF and in no distress Mental status - normal mood, behavior, speech, dress, motor activity,  and thought processes Neck - supple, no significant adenopathy Chest - clear to auscultation, no wheezes, rales or rhonchi, symmetric air entry Heart - normal rate, regular rhythm, normal S1, S2, no murmurs, rubs, clicks or gallops Extremities - peripheral pulses normal, no pedal edema, no clubbing or cyanosis      Latest Ref Rng & Units 07/14/2022    1:39 PM 07/03/2022   12:26 PM 06/01/2022    3:24 PM  CMP  Glucose 70 - 99 mg/dL  91  78   BUN 6 - 20 mg/dL  20  17   Creatinine 1.61 - 1.00 mg/dL  0.96  0.45   Sodium 409 - 145 mmol/L  138  141   Potassium 3.5 - 5.2 mmol/L 3.5  2.8  3.9   Chloride 98 - 111 mmol/L  103  102   CO2 22 - 32 mmol/L  29  22   Calcium 8.9 - 10.3 mg/dL  9.0  9.6   Total Protein 6.5 - 8.1 g/dL  7.9  8.1   Total Bilirubin 0.3 - 1.2 mg/dL  0.3  <8.1   Alkaline Phos 38 - 126 U/L  67  95   AST 15 - 41 U/L  14  14   ALT 0 - 44 U/L  14  17    Lipid Panel     Component Value Date/Time   CHOL 158 06/01/2022 1524   TRIG 113 06/01/2022 1524   HDL 36 (L) 06/01/2022 1524   CHOLHDL 4.4 06/01/2022 1524   CHOLHDL 3.9 09/05/2013 1148   VLDL 24 09/05/2013 1148   LDLCALC 101 (H) 06/01/2022 1524    CBC    Component Value Date/Time   WBC 12.2 (H) 07/03/2022 1226   WBC 9.3 04/14/2016 0918   RBC 4.68 07/03/2022 1226   HGB 12.3 07/03/2022 1226   HGB 13.7 06/01/2022 1524   HCT 38.2  07/03/2022 1226   HCT 43.0 06/01/2022 1524   PLT 457 (H) 07/03/2022 1226   PLT 491 (H) 06/01/2022 1524   MCV 81.6 07/03/2022 1226   MCV 81 06/01/2022 1524   MCH 26.3 07/03/2022 1226   MCHC 32.2 07/03/2022 1226   RDW 14.6 07/03/2022 1226   RDW 14.0 06/01/2022 1524   LYMPHSABS 4.3 (H) 07/03/2022 1226   LYMPHSABS 3.6 (H) 08/18/2021 0915   MONOABS 0.5 07/03/2022 1226   EOSABS 0.2 07/03/2022 1226   EOSABS 0.3 08/18/2021 0915   BASOSABS 0.1 07/03/2022 1226   BASOSABS 0.1 08/18/2021 0915   Results for orders placed or performed in visit on 12/11/22  POCT glycosylated hemoglobin (Hb A1C)  Result Value Ref Range   Hemoglobin A1C     HbA1c POC (<> result, manual entry)     HbA1c, POC (prediabetic range) 5.4 (A) 5.7 - 6.4 %   HbA1c, POC (controlled diabetic range)      ASSESSMENT AND PLAN: 1. Morbid obesity with BMI of 50.0-59.9, adult (HCC) Commended on changes made in her eating habits so far.  She is agreeable to seeing a nutritionist. Commended her on moving more. We will try again to get her approved for [facility].   I went over with her how the medication works and possible side effects. Side effects that were reviewed include nausea, vomiting, diarrhea, pancreatitis, bowel obstruction. No history of medullary thyroid cancer. Advised patient to stop the medicine if she develops vomiting, significant diarrhea or pain in the upper abdomen. Start on low-dose 0.25 mg once a week. If she tolerates it, after  1 month we can increase the dose to 0.5 mg. Clinical pharmacist met with patient today to be taught administration.  - Semaglutide-Weight Management (WEGOVY) 0.25 MG/0.5ML SOAJ; Inject 0.25 mg into the skin once a week.  Dispense: 2 mL; Refill: 0 - Amb ref to Medical Nutrition Therapy-MNT  2. Prediabetes No longer preDM range - POCT glycosylated hemoglobin (Hb A1C)  3. Essential hypertension Close to goal Continue hydrochlorothiazide 25 mg daily along with potassium supplement. -  Basic Metabolic Panel  4. Leukocytosis, unspecified type 5. Thrombocytosis Encouraged her to schedule follow-up appointment with hematology for further workup.  6. Encounter for immunization - Flu vaccine trivalent PF, 6mos and older(Flulaval,Afluria,Fluarix,Fluzone)     Patient was given the opportunity to ask questions.  Patient verbalized understanding of the plan and was able to repeat key elements of the plan.   This documentation was completed using Paediatric nurse.  Any transcriptional errors are unintentional.  Orders Placed This Encounter  Procedures   Flu vaccine trivalent PF, 6mos and older(Flulaval,Afluria,Fluarix,Fluzone)   Basic Metabolic Panel   Amb ref to Medical Nutrition Therapy-MNT   POCT glycosylated hemoglobin (Hb A1C)     Requested Prescriptions   Signed Prescriptions Disp Refills   Semaglutide-Weight Management (WEGOVY) 0.25 MG/0.5ML SOAJ 2 mL 0    Sig: Inject 0.25 mg into the skin once a week.    Return in about 5 weeks (around 01/15/2023).  [name], MD, FACP

## 2022-12-11 NOTE — Patient Instructions (Signed)
 Semaglutide Injection (Weight Management) What is this medication? SEMAGLUTIDE (SEM a GLOO tide) promotes weight loss. It may also be used to maintain weight loss.  It works by decreasing appetite. It can be used to lower the risk of heart attack and stroke in people affected by excess weight. Changes to diet and exercise are often combined with this medication. This medicine may be used for other purposes; ask your health care provider or pharmacist if you have questions. COMMON BRAND NAME(S): GNFAOZ What should I tell my care team before I take this medication? They need to know if you have any of these conditions: Diabetes Eye disease caused by diabetes History of depression or other mental health conditions Kidney disease Pancreatic disease Personal or family history of MEN 2, a condition that causes endocrine gland tumors Personal or family history of thyroid cancer Suicidal thoughts, plans, or attempt by you or a family member An unusual or allergic reaction to semaglutide, other medications, foods, dyes, or preservatives Pregnant or trying to get pregnant Breastfeeding How should I use this medication? This medication is injected under the skin. You will be taught how to prepare and give it. Take it as directed on the prescription label. It is given once every week (every 7 days). Keep taking it unless your care team tells you to stop. It is important that you put your used needles and pens in a special sharps container. Do not put them in a trash can. If you do not have a sharps container, call your pharmacist or care team to get one. A special MedGuide will be given to you by the pharmacist with each prescription and refill. Be sure to read this information carefully each time. This medication comes with INSTRUCTIONS FOR USE. Ask your pharmacist for directions on how to use this medication. Read the information carefully. Talk to your pharmacist or care team if you have  questions. Talk to your care team about the use of this medication in children. While it may be prescribed for children as young as 12 years for selected conditions, precautions do apply. Overdosage: If you think you have taken too much of this medicine contact a poison control center or emergency room at once. NOTE: This medicine is only for you. Do not share this medicine with others. What if I miss a dose? If you miss a dose and the next scheduled dose is more than 2 days away, take the missed dose as soon as possible. If you miss a dose and the next scheduled dose is less than 2 days away, do not take the missed dose. Take the next dose at your regular time. Do not take double or extra doses. If you miss your dose for 2 weeks or more, take the next dose at your regular time or call your care team to talk about how to restart this medication. What may interact with this medication? Insulin and other medications for diabetes This list may not describe all possible interactions. Give your health care provider a list of all the medicines, herbs, non-prescription drugs, or dietary supplements you use. Also tell them if you smoke, drink alcohol, or use illegal drugs. Some items may interact with your medicine. What should I watch for while using this medication? Visit your care team for regular checks on your progress. It may be some time before you see the benefit from this medication. Drink plenty of fluids while taking this medication. Check with your care team if you have severe  diarrhea, nausea, and vomiting, or if you sweat a lot. The loss of too much body fluid may make it dangerous for you to take this medication. This medication may affect blood sugar levels. Ask your care team if changes in diet or medications are needed if you have diabetes. Talk to your care team if you may be pregnant. Losing weight while pregnant is not advised and may cause harm to the fetus. Talk to your care team for more  information. What side effects may I notice from receiving this medication? Side effects that you should report to your care team as soon as possible: Allergic reactions--skin rash, itching, hives, swelling of the face, lips, tongue, or throat Change in vision Dehydration--increased thirst, dry mouth, feeling faint or lightheaded, headache, dark yellow or brown urine Gallbladder problems--severe stomach pain, nausea, vomiting, fever Heart palpitations--rapid, pounding, or irregular heartbeat Kidney injury--decrease in the amount of urine, swelling of the ankles, hands, or feet Pancreatitis--severe stomach pain that spreads to your back or gets worse after eating or when touched, fever, nausea, vomiting Thoughts of suicide or self-harm, worsening mood, feelings of depression Thyroid cancer--new mass or lump in the neck, pain or trouble swallowing, trouble breathing, hoarseness Side effects that usually do not require medical attention (report these to your care team if they continue or are bothersome): Diarrhea Loss of appetite Nausea Upset stomach This list may not describe all possible side effects. Call your doctor for medical advice about side effects. You may report side effects to FDA at 1-800-FDA-1088. Where should I keep my medication? Keep out of the reach of children and pets. Refrigeration (preferred): Store in the refrigerator. Do not freeze. Keep this medication in the original container until you are ready to take it. Get rid of any unused medication after the expiration date. Room temperature: If needed, prior to cap removal, the pen can be stored at room temperature for up to 28 days. Protect from light. If it is stored at room temperature, get rid of any unused medication after 28 days or after it expires, whichever is first. It is important to get rid of the medication as soon as you no longer need it or it is expired. You can do this in two ways: Take the medication to a  medication take-back program. Check with your pharmacy or law enforcement to find a location. If you cannot return the medication, follow the directions in the MedGuide. NOTE: This sheet is a summary. It may not cover all possible information. If you have questions about this medicine, talk to your doctor, pharmacist, or health care provider.  2024 Elsevier/Gold Standard (2022-06-24 00:00:00)

## 2022-12-12 LAB — BASIC METABOLIC PANEL
BUN/Creatinine Ratio: 18 (ref 9–23)
BUN: 20 mg/dL (ref 6–24)
CO2: 26 mmol/L (ref 20–29)
Calcium: 9.2 mg/dL (ref 8.7–10.2)
Chloride: 100 mmol/L (ref 96–106)
Creatinine, Ser: 1.11 mg/dL — ABNORMAL HIGH (ref 0.57–1.00)
Glucose: 74 mg/dL (ref 70–99)
Potassium: 3.4 mmol/L — ABNORMAL LOW (ref 3.5–5.2)
Sodium: 139 mmol/L (ref 134–144)
eGFR: 64 mL/min/{1.73_m2} (ref >59)

## 2022-12-14 ENCOUNTER — Telehealth: Payer: Self-pay

## 2022-12-14 NOTE — Telephone Encounter (Signed)
Pharmacy Patient Advocate Encounter   Received notification from CoverMyMeds that prior authorization for [facility] is required/requested.   Insurance verification completed.   The patient is insured through [facility] .   Per test claim: PA required; PA submitted to [facility] via CoverMyMeds Key/confirmation #/EOC KEY: [id number] Status is pending

## 2022-12-15 ENCOUNTER — Other Ambulatory Visit: Payer: Self-pay

## 2022-12-18 ENCOUNTER — Telehealth: Payer: Self-pay

## 2022-12-18 ENCOUNTER — Other Ambulatory Visit: Payer: Self-pay

## 2022-12-18 ENCOUNTER — Other Ambulatory Visit (HOSPITAL_COMMUNITY): Payer: Self-pay

## 2022-12-18 NOTE — Telephone Encounter (Signed)
Pharmacy Patient Advocate Encounter  Received notification from [facility] MEDICAID that Prior Authorization for [facility] has been APPROVED from 12/15/2022 to 06/14/2023   PA #/Case ID/Reference #: [id number]

## 2022-12-18 NOTE — Telephone Encounter (Signed)
CREATED IN ERROR-DUPLICATE

## 2022-12-25 ENCOUNTER — Ambulatory Visit: Payer: 59

## 2022-12-30 ENCOUNTER — Ambulatory Visit (INDEPENDENT_AMBULATORY_CARE_PROVIDER_SITE_OTHER): Payer: 59 | Admitting: Emergency Medicine

## 2022-12-30 VITALS — BP 128/89 | HR 92 | Wt 309.0 lb

## 2022-12-30 DIAGNOSIS — Z3042 Encounter for surveillance of injectable contraceptive: Secondary | ICD-10-CM

## 2022-12-30 DIAGNOSIS — Z3202 Encounter for pregnancy test, result negative: Secondary | ICD-10-CM | POA: Diagnosis not present

## 2022-12-30 LAB — POCT URINE PREGNANCY: Preg Test, Ur: NEGATIVE

## 2022-12-30 MED ORDER — MEDROXYPROGESTERONE ACETATE 150 MG/ML IM SUSP
150.0000 mg | Freq: Once | INTRAMUSCULAR | Status: AC
Start: 2022-12-30 — End: 2022-12-30
  Administered 2022-12-30: 150 mg via INTRAMUSCULAR

## 2022-12-30 NOTE — Progress Notes (Signed)
Date last pap: 08/21/2022. Last Depo-Provera: 09/25/2022. Side Effects if any: NA. Serum HCG indicated? Urine HCG- Negative in Office- 5 days outside of Depo Window. Depo-Provera 150 mg IM given by: Resa Miner into RD, tolerated well in office. Next appointment due Dec 11- Dec 25.

## 2023-01-12 ENCOUNTER — Encounter: Payer: Self-pay | Admitting: Internal Medicine

## 2023-01-13 ENCOUNTER — Other Ambulatory Visit: Payer: Self-pay | Admitting: Oncology

## 2023-01-13 ENCOUNTER — Other Ambulatory Visit: Payer: Self-pay | Admitting: Internal Medicine

## 2023-01-13 ENCOUNTER — Other Ambulatory Visit: Payer: Self-pay

## 2023-01-13 MED ORDER — WEGOVY 0.25 MG/0.5ML ~~LOC~~ SOAJ
0.2500 mg | SUBCUTANEOUS | 0 refills | Status: DC
Start: 2023-01-13 — End: 2023-02-11
  Filled 2023-01-13 – 2023-02-06 (×7): qty 2, 28d supply, fill #0

## 2023-01-14 ENCOUNTER — Other Ambulatory Visit: Payer: Self-pay

## 2023-01-14 ENCOUNTER — Telehealth: Payer: Self-pay

## 2023-01-14 NOTE — Telephone Encounter (Signed)
Pharmacy Patient Advocate Encounter   Received notification from CoverMyMeds that prior authorization for [facility] is required/requested.   Insurance verification completed.   The patient is insured through [facility] .   Per test claim: PA required; PA started via CoverMyMeds. KEY [id number] . Waiting for clinical questions to populate.

## 2023-01-15 ENCOUNTER — Other Ambulatory Visit: Payer: Self-pay

## 2023-01-15 NOTE — Telephone Encounter (Signed)
Pharmacy Patient Advocate Encounter  Received notification from [facility] that Prior Authorization for [facility] has been DENIED.  Full denial letter will be uploaded to the media tab. See denial reason below.   PA #/Case ID/Reference #: [id number]  PLAN EXCLUSION.

## 2023-01-19 ENCOUNTER — Ambulatory Visit: Payer: BC Managed Care – PPO | Admitting: Internal Medicine

## 2023-01-20 ENCOUNTER — Other Ambulatory Visit: Payer: Self-pay

## 2023-01-25 ENCOUNTER — Other Ambulatory Visit: Payer: Self-pay

## 2023-01-26 ENCOUNTER — Other Ambulatory Visit: Payer: Self-pay

## 2023-01-26 ENCOUNTER — Ambulatory Visit: Payer: BC Managed Care – PPO | Admitting: Internal Medicine

## 2023-01-30 ENCOUNTER — Other Ambulatory Visit (HOSPITAL_COMMUNITY): Payer: Self-pay

## 2023-02-01 ENCOUNTER — Other Ambulatory Visit: Payer: Self-pay | Admitting: Oncology

## 2023-02-01 ENCOUNTER — Other Ambulatory Visit (HOSPITAL_COMMUNITY): Payer: Self-pay

## 2023-02-01 ENCOUNTER — Encounter (HOSPITAL_COMMUNITY): Payer: Self-pay

## 2023-02-02 ENCOUNTER — Other Ambulatory Visit (HOSPITAL_COMMUNITY): Payer: Self-pay

## 2023-02-02 ENCOUNTER — Other Ambulatory Visit: Payer: Self-pay | Admitting: Internal Medicine

## 2023-02-02 ENCOUNTER — Other Ambulatory Visit: Payer: Self-pay | Admitting: Oncology

## 2023-02-02 NOTE — Telephone Encounter (Unsigned)
Copied from CRM [id number]. Topic: General - Other >> Feb 02, 2023  4:27 PM Everette C wrote: Reason for CRM: Medication Refill - Medication: Rx #: [id number]  Rivaroxaban (XARELTO) 15 MG TABS tablet [[id number]]    Has the patient contacted their pharmacy? Yes.   (Agent: If no, request that the patient contact the pharmacy for the refill. If patient does not wish to contact the pharmacy document the reason why and proceed with request.) (Agent: If yes, when and what did the pharmacy advise?)  Preferred Pharmacy (with phone number or street name): [facility] [address] [place] [address] Phone: [phone] Fax: [phone] Hours: Mon-Fri 7:30am-6pm; Sat 8:00am-4:30pm   Has the patient been seen for an appointment in the last year OR does the patient have an upcoming appointment? Yes.    Agent: Please be advised that RX refills may take up to 3 business days. We ask that you follow-up with your pharmacy.

## 2023-02-03 ENCOUNTER — Other Ambulatory Visit: Payer: Self-pay | Admitting: Internal Medicine

## 2023-02-03 ENCOUNTER — Other Ambulatory Visit (HOSPITAL_COMMUNITY): Payer: Self-pay

## 2023-02-03 DIAGNOSIS — I10 Essential (primary) hypertension: Secondary | ICD-10-CM

## 2023-02-03 MED ORDER — RIVAROXABAN 15 MG PO TABS
15.0000 mg | ORAL_TABLET | Freq: Every day | ORAL | 3 refills | Status: DC
  Filled 2023-02-03 – 2023-02-08 (×2): qty 30, 30d supply, fill #0
  Filled 2023-03-08: qty 30, 30d supply, fill #1
  Filled 2023-04-02: qty 30, 30d supply, fill #2
  Filled 2023-05-08: qty 30, 30d supply, fill #3

## 2023-02-03 NOTE — Telephone Encounter (Signed)
Labs in date.   Not transmitted to pharmacy on 02/02/2023.   Transmitted today  Requested Prescriptions  Pending Prescriptions Disp Refills   Rivaroxaban (XARELTO) 15 MG TABS tablet 30 tablet 3    Sig: Take 1 tablet (15 mg total) by mouth daily.     Hematology: Anticoagulants - rivaroxaban Failed - 02/02/2023  4:43 PM      Failed - AST in normal range and within 360 days    AST  Date Value Ref Range Status  07/03/2022 14 (L) 15 - 41 U/L Final         Failed - Cr in normal range and within 360 days    Creatinine  Date Value Ref Range Status  07/03/2022 1.01 (H) 0.44 - 1.00 mg/dL Final   Creat  Date Value Ref Range Status  04/14/2016 1.09 0.50 - 1.10 mg/dL Final   Creatinine, Ser  Date Value Ref Range Status  12/11/2022 1.11 (H) 0.57 - 1.00 mg/dL Final         Failed - PLT in normal range and within 360 days    Platelets  Date Value Ref Range Status  06/01/2022 491 (H) 150 - 450 x10E3/uL Final   Platelet Count  Date Value Ref Range Status  07/03/2022 457 (H) 150 - 400 K/uL Final         Passed - ALT in normal range and within 360 days    ALT  Date Value Ref Range Status  07/03/2022 14 0 - 44 U/L Final         Passed - HCT in normal range and within 360 days    HCT  Date Value Ref Range Status  07/03/2022 38.2 36.0 - 46.0 % Final   Hematocrit  Date Value Ref Range Status  06/01/2022 43.0 34.0 - 46.6 % Final         Passed - HGB in normal range and within 360 days    Hemoglobin  Date Value Ref Range Status  07/03/2022 12.3 12.0 - 15.0 g/dL Final  25/36/6440 34.7 11.1 - 15.9 g/dL Final         Passed - eGFR is 15 or above and within 360 days    GFR, Est African American  Date Value Ref Range Status  04/14/2016 77 >=60 mL/min Final   GFR calc Af Amer  Date Value Ref Range Status  04/16/2020 79 >59 mL/min/1.73 Final    Comment:    **In accordance with recommendations from the NKF-ASN Task force,**   [facility] is in the process of updating its eGFR  calculation to the   2021 CKD-EPI creatinine equation that estimates kidney function   without a race variable.    GFR, Est Non African American  Date Value Ref Range Status  04/14/2016 67 >=60 mL/min Final   GFR, Estimated  Date Value Ref Range Status  07/03/2022 >60 >60 mL/min Final    Comment:    (NOTE) Calculated using the CKD-EPI Creatinine Equation (2021)    eGFR  Date Value Ref Range Status  12/11/2022 64 >59 mL/min/1.73 Final         Passed - Patient is not pregnant      Passed - Valid encounter within last 12 months    Recent Outpatient Visits           1 month ago Morbid obesity with BMI of 50.0-59.9, adult [facility])   [place] [facility] Marcine Matar, MD   8 months ago Essential  hypertension   [facility] Marcine Matar, MD   1 year ago Essential hypertension   [facility] Marcine Matar, MD   2 years ago Essential hypertension   [facility] Marcine Matar, MD   2 years ago On continuous oral anticoagulation    [facility] Marcine Matar, MD       Future Appointments             In 1 week Marcine Matar, MD [facility]

## 2023-02-04 ENCOUNTER — Other Ambulatory Visit (HOSPITAL_COMMUNITY): Payer: Self-pay

## 2023-02-04 ENCOUNTER — Other Ambulatory Visit: Payer: Self-pay

## 2023-02-04 MED ORDER — HYDROCHLOROTHIAZIDE 25 MG PO TABS
25.0000 mg | ORAL_TABLET | Freq: Every day | ORAL | 0 refills | Status: DC
  Filled 2023-02-04 – 2023-03-03 (×3): qty 30, 30d supply, fill #0
  Filled 2023-04-02: qty 30, 30d supply, fill #1
  Filled 2023-05-08: qty 30, 30d supply, fill #2

## 2023-02-06 ENCOUNTER — Other Ambulatory Visit (HOSPITAL_COMMUNITY): Payer: Self-pay

## 2023-02-06 ENCOUNTER — Encounter (HOSPITAL_COMMUNITY): Payer: Self-pay

## 2023-02-08 ENCOUNTER — Other Ambulatory Visit (HOSPITAL_COMMUNITY): Payer: Self-pay

## 2023-02-11 ENCOUNTER — Ambulatory Visit: Payer: BC Managed Care – PPO | Attending: Internal Medicine | Admitting: Internal Medicine

## 2023-02-11 ENCOUNTER — Encounter: Payer: Self-pay | Admitting: Internal Medicine

## 2023-02-11 ENCOUNTER — Other Ambulatory Visit (HOSPITAL_COMMUNITY): Payer: Self-pay

## 2023-02-11 ENCOUNTER — Other Ambulatory Visit: Payer: Self-pay

## 2023-02-11 DIAGNOSIS — Z6841 Body Mass Index (BMI) 40.0 and over, adult: Secondary | ICD-10-CM | POA: Diagnosis not present

## 2023-02-11 MED ORDER — WEGOVY 0.25 MG/0.5ML ~~LOC~~ SOAJ
0.2500 mg | SUBCUTANEOUS | 1 refills | Status: DC
  Filled 2023-02-11 – 2023-05-08 (×4): qty 2, 28d supply, fill #0

## 2023-02-11 NOTE — Progress Notes (Signed)
Patient ID: Karen Robles, female    DOB: 1980/06/22  MRN: [id number]  CC: Medication Refill Reginal Lutes f/u. Nicki Reaper unable to obtain [facility] due to shortage/Already received flu vax)   Subjective: Karen Robles is a 42 y.o. female who presents for f/u on obesity management. 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   Patient presents for follow-up on obesity.  On last visit in 12/2022 we prescribed Wegovy but it was denied by her insurance through [facility].  Pt had BCBC/with [employer] [facility] as 2nd.  Reports she no longer has this insurance.  Now has [place] medicaid and this approved the [facility] but her pharmacy does not have the 0.25 mg dose.   Referred to nutritionist on last visit.  They called her several times but she has not called back.  Plans to do so. 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   Patient Active Problem List   Diagnosis Date Noted   Primary hypercoagulable state (HCC) 08/03/2022   Chronic anticoagulation 08/03/2022   Granulocytosis 07/16/2022   Thrombocytosis 08/18/2021   Leukocytosis 08/18/2021   Influenza vaccine needed 04/16/2020   COVID-19 vaccine series completed 04/16/2020   History of pulmonary embolism 07/22/2017   Depo-Provera contraceptive status 06/11/2016   Severe obesity (BMI >= 40) (HCC) 07/26/2013   Essential hypertension 03/07/2013     Current Outpatient Medications on File Prior to Visit  Medication Sig Dispense Refill   hydrochlorothiazide (HYDRODIURIL) 25 MG tablet Take 1 tablet (25 mg total) by mouth daily. 90 tablet 0   medroxyPROGESTERone (DEPO-PROVERA) 150 MG/ML injection Inject 1 mL (150 mg total) into the muscle every 3 (three) months. 1 mL 4   potassium chloride SA (KLOR-CON M) 20 MEQ tablet Take 2 tablets (40 mEq total) by mouth daily. 180 tablet 2   Rivaroxaban (XARELTO) 15 MG TABS tablet Take 1 tablet (15 mg total) by mouth  daily. 30 tablet 3   Semaglutide-Weight Management (WEGOVY) 0.25 MG/0.5ML SOAJ Inject 0.25 mg into the skin once a week. (Patient not taking: Reported on 02/11/2023) 2 mL 0   Current Facility-Administered Medications on File Prior to Visit  Medication Dose Route Frequency Provider Last Rate Last Admin   medroxyPROGESTERone (DEPO-PROVERA) injection 150 mg  150 mg Intramuscular Once Brock Bad, MD        No Known Allergies  Social History   Socioeconomic History   Marital status: Single    Spouse name: Not on file   Number of children: 2   Years of education: Not on file   Highest education level: 12th grade  Occupational History   Occupation: Unemployed  Tobacco Use   Smoking status: Never   Smokeless tobacco: Never  Vaping Use   Vaping status: Never Used  Substance and Sexual Activity   Alcohol use: No    Alcohol/week: 0.0 standard drinks of alcohol   Drug use: No   Sexual activity: Yes    Partners: Male    Birth control/protection: Injection  Other Topics Concern   Not on file  Social History Narrative   Not on file   Social Determinants of Health   Financial Resource Strain: Low Risk  (02/11/2023)   Overall Financial Resource Strain (CARDIA)    Difficulty of Paying Living Expenses: Not hard at all  Food Insecurity: No Food Insecurity (02/11/2023)   Hunger Vital Sign  Worried About Programme researcher, broadcasting/film/video in the Last Year: Never true    Ran Out of Food in the Last Year: Never true  Transportation Needs: No Transportation Needs (02/11/2023)   PRAPARE - Administrator, Civil Service (Medical): No    Lack of Transportation (Non-Medical): No  Physical Activity: Insufficiently Active (02/11/2023)   Exercise Vital Sign    Days of Exercise per Week: 4 days    Minutes of Exercise per Session: 30 min  Stress: No Stress Concern Present (02/11/2023)   [employer] of Occupational Health - Occupational Stress Questionnaire    Feeling of Stress : Not at all   Social Connections: Socially Integrated (02/11/2023)   Social Connection and Isolation Panel [NHANES]    Frequency of Communication with Friends and Family: More than three times a week    Frequency of Social Gatherings with Friends and Family: Three times a week    Attends Religious Services: More than 4 times per year    Active Member of Clubs or Organizations: Yes    Attends Banker Meetings: 1 to 4 times per year    Marital Status: Living with partner  Intimate Partner Violence: Not At Risk (02/11/2023)   Humiliation, Afraid, Rape, and Kick questionnaire    Fear of Current or Ex-Partner: No    Emotionally Abused: No    Physically Abused: No    Sexually Abused: No    Family History  Problem Relation Age of Onset   Hypertension Mother        maternal aunts & uncles   Hyperlipidemia Mother    Healthy Brother    Hypertension Maternal Aunt    Hypertension Maternal Uncle    Cancer Maternal Grandmother    Diabetes Neg Hx    Heart disease Neg Hx     Past Surgical History:  Procedure Laterality Date   MOUTH SURGERY     WISDOM TOOTH EXTRACTION Bilateral     ROS: Review of Systems Negative except as stated above  PHYSICAL EXAM: BP 102/71 (BP Location: Left Arm, Patient Position: Sitting, Cuff Size: Large)   Pulse 92   Temp 98.3 F (36.8 C) (Oral)   Ht 5\' 3"  (1.6 m)   Wt (!) 320 lb (145.2 kg)   SpO2 98%   BMI 56.69 kg/m   Physical Exam  General appearance - alert, well appearing, morbidly obese middle-age African-American female and in no distress Mental status - normal mood, behavior, speech, dress, motor activity, and thought processes      Latest Ref Rng & Units 12/11/2022    2:41 PM 07/14/2022    1:39 PM 07/03/2022   12:26 PM  CMP  Glucose 70 - 99 mg/dL 74   91   BUN 6 - 24 mg/dL 20   20   Creatinine 7.32 - 1.00 mg/dL 2.02   5.42   Sodium 706 - 144 mmol/L 139   138   Potassium 3.5 - 5.2 mmol/L 3.4  3.5  2.8   Chloride 96 - 106 mmol/L 100   103    CO2 20 - 29 mmol/L 26   29   Calcium 8.7 - 10.2 mg/dL 9.2   9.0   Total Protein 6.5 - 8.1 g/dL   7.9   Total Bilirubin 0.3 - 1.2 mg/dL   0.3   Alkaline Phos 38 - 126 U/L   67   AST 15 - 41 U/L   14   ALT 0 - 44 U/L  14    Lipid Panel     Component Value Date/Time   CHOL 158 06/01/2022 1524   TRIG 113 06/01/2022 1524   HDL 36 (L) 06/01/2022 1524   CHOLHDL 4.4 06/01/2022 1524   CHOLHDL 3.9 09/05/2013 1148   VLDL 24 09/05/2013 1148   LDLCALC 101 (H) 06/01/2022 1524    CBC    Component Value Date/Time   WBC 12.2 (H) 07/03/2022 1226   WBC 9.3 04/14/2016 0918   RBC 4.68 07/03/2022 1226   HGB 12.3 07/03/2022 1226   HGB 13.7 06/01/2022 1524   HCT 38.2 07/03/2022 1226   HCT 43.0 06/01/2022 1524   PLT 457 (H) 07/03/2022 1226   PLT 491 (H) 06/01/2022 1524   MCV 81.6 07/03/2022 1226   MCV 81 06/01/2022 1524   MCH 26.3 07/03/2022 1226   MCHC 32.2 07/03/2022 1226   RDW 14.6 07/03/2022 1226   RDW 14.0 06/01/2022 1524   LYMPHSABS 4.3 (H) 07/03/2022 1226   LYMPHSABS 3.6 (H) 08/18/2021 0915   MONOABS 0.5 07/03/2022 1226   EOSABS 0.2 07/03/2022 1226   EOSABS 0.3 08/18/2021 0915   BASOSABS 0.1 07/03/2022 1226   BASOSABS 0.1 08/18/2021 0915    ASSESSMENT AND PLAN: 1. Morbid obesity with BMI of 50.0-59.9, adult (HCC) I have resent prescription for [facility] to our pharmacy.  I went over with her again how the medication works and possible side effects. Side effects that were reviewed include nausea, vomiting, diarrhea, pancreatitis, bowel obstruction. No history of medullary thyroid cancer. Advised patient to stop the medicine if she develops vomiting, significant diarrhea or pain in the upper abdomen After being on the 0.25 mg for 1 month, if she is tolerating it, she can send me a MyChart message so that we can increase the dose to the 0.5 mg.  I will see her back in 2 months.  Advised to call and schedule the appointment with the nutritionist.  Telephone information provided on her  discharge summary. [facility] Medicaid Card given to front desk to update her insurance info in the system - Semaglutide-Weight Management (WEGOVY) 0.25 MG/0.5ML SOAJ; Inject 0.25 mg into the skin once a week.  Dispense: 2 mL; Refill: 1    Patient was given the opportunity to ask questions.  Patient verbalized understanding of the plan and was able to repeat key elements of the plan.   This documentation was completed using Paediatric nurse.  Any transcriptional errors are unintentional.  No orders of the defined types were placed in this encounter.    Requested Prescriptions    No prescriptions requested or ordered in this encounter    No follow-ups on file.  [name], MD, FACP

## 2023-02-11 NOTE — Patient Instructions (Signed)
Please return call to [facility] Ph.# [phone] address [address] [place] [address] to  schedule appt with nutritionist.  Semaglutide Injection (Weight Management) What is this medication? SEMAGLUTIDE (SEM a GLOO tide) promotes weight loss. It may also be used to maintain weight loss.  It works by decreasing appetite. It can be used to lower the risk of heart attack and stroke in people affected by excess weight. Changes to diet and exercise are often combined with this medication. This medicine may be used for other purposes; ask your health care provider or pharmacist if you have questions. COMMON BRAND NAME(S): ONGEXB What should I tell my care team before I take this medication? They need to know if you have any of these conditions: Diabetes Eye disease caused by diabetes History of depression or other mental health conditions Kidney disease Pancreatic disease Personal or family history of MEN 2, a condition that causes endocrine gland tumors Personal or family history of thyroid cancer Suicidal thoughts, plans, or attempt by you or a family member An unusual or allergic reaction to semaglutide, other medications, foods, dyes, or preservatives Pregnant or trying to get pregnant Breastfeeding How should I use this medication? This medication is injected under the skin. You will be taught how to prepare and give it. Take it as directed on the prescription label. It is given once every week (every 7 days). Keep taking it unless your care team tells you to stop. It is important that you put your used needles and pens in a special sharps container. Do not put them in a trash can. If you do not have a sharps container, call your pharmacist or care team to get one. A special MedGuide will be given to you by the pharmacist with each prescription and refill. Be sure to read this information carefully each time. This medication comes with INSTRUCTIONS FOR USE. Ask your pharmacist for  directions on how to use this medication. Read the information carefully. Talk to your pharmacist or care team if you have questions. Talk to your care team about the use of this medication in children. While it may be prescribed for children as young as 12 years for selected conditions, precautions do apply. Overdosage: If you think you have taken too much of this medicine contact a poison control center or emergency room at once. NOTE: This medicine is only for you. Do not share this medicine with others. What if I miss a dose? If you miss a dose and the next scheduled dose is more than 2 days away, take the missed dose as soon as possible. If you miss a dose and the next scheduled dose is less than 2 days away, do not take the missed dose. Take the next dose at your regular time. Do not take double or extra doses. If you miss your dose for 2 weeks or more, take the next dose at your regular time or call your care team to talk about how to restart this medication. What may interact with this medication? Insulin and other medications for diabetes This list may not describe all possible interactions. Give your health care provider a list of all the medicines, herbs, non-prescription drugs, or dietary supplements you use. Also tell them if you smoke, drink alcohol, or use illegal drugs. Some items may interact with your medicine. What should I watch for while using this medication? Visit your care team for regular checks on your progress. It may be some time before you  see the benefit from this medication. Drink plenty of fluids while taking this medication. Check with your care team if you have severe diarrhea, nausea, and vomiting, or if you sweat a lot. The loss of too much body fluid may make it dangerous for you to take this medication. This medication may affect blood sugar levels. Ask your care team if changes in diet or medications are needed if you have diabetes. Talk to your care team if you  may be pregnant. Losing weight while pregnant is not advised and may cause harm to the fetus. Talk to your care team for more information. What side effects may I notice from receiving this medication? Side effects that you should report to your care team as soon as possible: Allergic reactions--skin rash, itching, hives, swelling of the face, lips, tongue, or throat Change in vision Dehydration--increased thirst, dry mouth, feeling faint or lightheaded, headache, dark yellow or brown urine Gallbladder problems--severe stomach pain, nausea, vomiting, fever Heart palpitations--rapid, pounding, or irregular heartbeat Kidney injury--decrease in the amount of urine, swelling of the ankles, hands, or feet Pancreatitis--severe stomach pain that spreads to your back or gets worse after eating or when touched, fever, nausea, vomiting Thoughts of suicide or self-harm, worsening mood, feelings of depression Thyroid cancer--new mass or lump in the neck, pain or trouble swallowing, trouble breathing, hoarseness Side effects that usually do not require medical attention (report these to your care team if they continue or are bothersome): Diarrhea Loss of appetite Nausea Upset stomach This list may not describe all possible side effects. Call your doctor for medical advice about side effects. You may report side effects to FDA at 1-800-FDA-1088. Where should I keep my medication? Keep out of the reach of children and pets. Refrigeration (preferred): Store in the refrigerator. Do not freeze. Keep this medication in the original container until you are ready to take it. Get rid of any unused medication after the expiration date. Room temperature: If needed, prior to cap removal, the pen can be stored at room temperature for up to 28 days. Protect from light. If it is stored at room temperature, get rid of any unused medication after 28 days or after it expires, whichever is first. It is important to get rid of  the medication as soon as you no longer need it or it is expired. You can do this in two ways: Take the medication to a medication take-back program. Check with your pharmacy or law enforcement to find a location. If you cannot return the medication, follow the directions in the MedGuide. NOTE: This sheet is a summary. It may not cover all possible information. If you have questions about this medicine, talk to your doctor, pharmacist, or health care provider.  2024 Elsevier/Gold Standard (2022-06-24 00:00:00)

## 2023-02-24 ENCOUNTER — Other Ambulatory Visit: Payer: Self-pay

## 2023-03-03 ENCOUNTER — Other Ambulatory Visit (HOSPITAL_COMMUNITY): Payer: Self-pay

## 2023-03-03 ENCOUNTER — Other Ambulatory Visit: Payer: Self-pay

## 2023-03-08 ENCOUNTER — Encounter (HOSPITAL_COMMUNITY): Payer: Self-pay

## 2023-03-08 ENCOUNTER — Other Ambulatory Visit (HOSPITAL_COMMUNITY): Payer: Self-pay

## 2023-03-08 ENCOUNTER — Other Ambulatory Visit: Payer: Self-pay

## 2023-03-09 ENCOUNTER — Other Ambulatory Visit: Payer: Self-pay

## 2023-03-19 ENCOUNTER — Other Ambulatory Visit: Payer: Self-pay

## 2023-03-19 ENCOUNTER — Ambulatory Visit: Payer: BC Managed Care – PPO

## 2023-03-29 ENCOUNTER — Ambulatory Visit: Payer: BC Managed Care – PPO

## 2023-04-13 ENCOUNTER — Ambulatory Visit: Payer: BC Managed Care – PPO | Admitting: Internal Medicine

## 2023-04-27 ENCOUNTER — Ambulatory Visit (INDEPENDENT_AMBULATORY_CARE_PROVIDER_SITE_OTHER): Payer: 59

## 2023-04-27 VITALS — BP 149/91 | HR 103 | Ht 64.0 in | Wt 334.0 lb

## 2023-04-27 DIAGNOSIS — Z3202 Encounter for pregnancy test, result negative: Secondary | ICD-10-CM | POA: Diagnosis not present

## 2023-04-27 DIAGNOSIS — Z3042 Encounter for surveillance of injectable contraceptive: Secondary | ICD-10-CM | POA: Diagnosis not present

## 2023-04-27 LAB — POCT URINE PREGNANCY: Preg Test, Ur: NEGATIVE

## 2023-04-27 NOTE — Progress Notes (Signed)
Karen Robles presents today for 1st UPT to restart Depo Injections. She has no unusual complaints.  LMP:04/21/2023??    OBJECTIVE: Appears well, in no apparent distress.  OB History     Gravida  2   Para  2   Term  2   Preterm      AB      Living  2      SAB      IAB      Ectopic      Multiple      Live Births  2          Home UPT Result: In-Office UPT result:NEGATIVE  I have reviewed the patient's medical, obstetrical, social, and family histories, and medications.   ASSESSMENT: Negative pregnancy test  PLAN Return in 2 weeks for 2nd UPT and Depo Injection.  Pt needs to FU with PCP due to elevated BP readings in Office today.  Pt agreed to call her PCP today.

## 2023-05-08 ENCOUNTER — Other Ambulatory Visit (HOSPITAL_COMMUNITY): Payer: Self-pay

## 2023-05-11 ENCOUNTER — Ambulatory Visit (INDEPENDENT_AMBULATORY_CARE_PROVIDER_SITE_OTHER): Payer: 59 | Admitting: *Deleted

## 2023-05-11 ENCOUNTER — Other Ambulatory Visit: Payer: Self-pay

## 2023-05-11 VITALS — BP 138/85 | HR 110

## 2023-05-11 DIAGNOSIS — Z3042 Encounter for surveillance of injectable contraceptive: Secondary | ICD-10-CM | POA: Diagnosis not present

## 2023-05-11 DIAGNOSIS — Z3202 Encounter for pregnancy test, result negative: Secondary | ICD-10-CM | POA: Diagnosis not present

## 2023-05-11 LAB — POCT URINE PREGNANCY: Preg Test, Ur: NEGATIVE

## 2023-05-11 MED ORDER — MEDROXYPROGESTERONE ACETATE 150 MG/ML IM SUSP
150.0000 mg | Freq: Once | INTRAMUSCULAR | Status: AC
  Administered 2023-05-11: 150 mg via INTRAMUSCULAR

## 2023-05-11 NOTE — Progress Notes (Signed)
Date last pap: 08/21/22. Last Depo-Provera: 12/30/22. Side Effects if any: NA. Serum HCG indicated? Neg/ 2nd depo restart UPT. Depo-Provera 150 mg IM given by: Montez Morita, RN in Right Deltoid. Next appointment due 07/27/23-08/10/23.

## 2023-06-11 ENCOUNTER — Other Ambulatory Visit: Payer: Self-pay

## 2023-06-11 ENCOUNTER — Other Ambulatory Visit (HOSPITAL_COMMUNITY): Payer: Self-pay

## 2023-06-11 ENCOUNTER — Other Ambulatory Visit: Payer: Self-pay | Admitting: Internal Medicine

## 2023-06-11 DIAGNOSIS — I10 Essential (primary) hypertension: Secondary | ICD-10-CM

## 2023-06-11 MED ORDER — RIVAROXABAN 15 MG PO TABS
15.0000 mg | ORAL_TABLET | Freq: Every day | ORAL | 0 refills | Status: DC
  Filled 2023-06-11: qty 30, 30d supply, fill #0

## 2023-06-11 MED ORDER — HYDROCHLOROTHIAZIDE 25 MG PO TABS
25.0000 mg | ORAL_TABLET | Freq: Every day | ORAL | 0 refills | Status: DC
  Filled 2023-06-11: qty 30, 30d supply, fill #0

## 2023-06-13 ENCOUNTER — Telehealth: Payer: Self-pay | Admitting: Internal Medicine

## 2023-06-13 DIAGNOSIS — Z111 Encounter for screening for respiratory tuberculosis: Secondary | ICD-10-CM

## 2023-06-13 NOTE — Telephone Encounter (Signed)
-----   Message from Nurse Cassandra B sent at 06/11/2023 10:49 AM EST ----- Good morning,   This patient is scheduled for Flu shot Monday , asking if she can get a Quantiferon gold assay as well. Is  it ok if I schedule her this lab on Monday?  Thanks  Cassie

## 2023-06-13 NOTE — Telephone Encounter (Signed)
 Is she needing TB screening for work? I have entered the order.

## 2023-06-15 NOTE — Telephone Encounter (Signed)
 Thank you for placing the order. I am not sure if the test is needed for school or work ,as I received a written message that said she would need  the Quantiferon gold assay instead if the Skin test. I will investigate further at her appointment on Monday.

## 2023-06-16 ENCOUNTER — Other Ambulatory Visit (HOSPITAL_COMMUNITY): Payer: Self-pay

## 2023-06-22 ENCOUNTER — Encounter: Admitting: Nurse Practitioner

## 2023-06-22 DIAGNOSIS — N939 Abnormal uterine and vaginal bleeding, unspecified: Secondary | ICD-10-CM

## 2023-06-26 NOTE — Progress Notes (Signed)
 Because you reported abnormal uterine bleeding, I feel your condition warrants further evaluation and I recommend that you be seen for a face to face visit.  Please contact your primary care physician practice to be seen. Many offices offer virtual options to be seen via video if you are not comfortable going in person to a medical facility at this time.  NOTE: You will NOT be charged for this eVisit.  If you do not have a PCP, [facility] offers a free physician referral service available at [phone]. Our trained staff has the experience, knowledge and resources to put you in touch with a physician who is right for you.    If you are having a true medical emergency please call 911.   Your e-visit answers were reviewed by a board certified advanced clinical practitioner to complete your personal care plan.  Thank you for using e-Visits.

## 2023-06-28 ENCOUNTER — Ambulatory Visit: Admitting: Obstetrics

## 2023-06-28 ENCOUNTER — Encounter: Payer: Self-pay | Admitting: Obstetrics

## 2023-06-28 ENCOUNTER — Other Ambulatory Visit (HOSPITAL_COMMUNITY)
Admission: RE | Admit: 2023-06-28 | Discharge: 2023-06-28 | Disposition: A | Discharge status: Home / Self Care | Source: Ambulatory Visit | Attending: Obstetrics | Admitting: Obstetrics

## 2023-06-28 VITALS — BP 131/92 | HR 109 | Ht 63.0 in | Wt 329.7 lb

## 2023-06-28 DIAGNOSIS — N898 Other specified noninflammatory disorders of vagina: Secondary | ICD-10-CM | POA: Insufficient documentation

## 2023-06-28 DIAGNOSIS — Z3042 Encounter for surveillance of injectable contraceptive: Secondary | ICD-10-CM | POA: Diagnosis not present

## 2023-06-28 DIAGNOSIS — Z113 Encounter for screening for infections with a predominantly sexual mode of transmission: Secondary | ICD-10-CM | POA: Diagnosis not present

## 2023-06-28 DIAGNOSIS — Z6841 Body Mass Index (BMI) 40.0 and over, adult: Secondary | ICD-10-CM

## 2023-06-28 DIAGNOSIS — Z86711 Personal history of pulmonary embolism: Secondary | ICD-10-CM

## 2023-06-28 DIAGNOSIS — N939 Abnormal uterine and vaginal bleeding, unspecified: Secondary | ICD-10-CM

## 2023-06-28 DIAGNOSIS — E66813 Obesity, class 3: Secondary | ICD-10-CM

## 2023-06-28 NOTE — Progress Notes (Signed)
 Patient ID: Karen Robles, female   DOB: [birth year], 43 y.o.   MRN: [id number]  Chief Complaint  Patient presents with   Menstrual Problem    HPI Karen Robles is a 43 y.o. female.  Complains of irregular vaginal bleeding.  Contraception:  Depo Provera.  Last pap smear:  08/21/2022.  Last mammogram:  09/12/2022. HPI  Past Medical History:  Diagnosis Date   Hypertension    Obesity    Pulmonary embolism (HCC)    Pulmonary embolism (HCC) 2015    Past Surgical History:  Procedure Laterality Date   MOUTH SURGERY     WISDOM TOOTH EXTRACTION Bilateral     Family History  Problem Relation Age of Onset   Hypertension Mother        maternal aunts & uncles   Hyperlipidemia Mother    Healthy Brother    Hypertension Maternal Aunt    Hypertension Maternal Uncle    Cancer Maternal Grandmother    Diabetes Neg Hx    Heart disease Neg Hx     Social History Social History   Tobacco Use   Smoking status: Never   Smokeless tobacco: Never  Vaping Use   Vaping status: Never Used  Substance Use Topics   Alcohol use: No    Alcohol/week: 0.0 standard drinks of alcohol   Drug use: No    No Known Allergies  Current Outpatient Medications  Medication Sig Dispense Refill   hydrochlorothiazide (HYDRODIURIL) 25 MG tablet Take 1 tablet (25 mg total) by mouth daily. 30 tablet 0   medroxyPROGESTERone (DEPO-PROVERA) 150 MG/ML injection Inject 1 mL (150 mg total) into the muscle every 3 (three) months. 1 mL 4   potassium chloride SA (KLOR-CON M) 20 MEQ tablet Take 2 tablets (40 mEq total) by mouth daily. 180 tablet 2   Rivaroxaban (XARELTO) 15 MG TABS tablet Take 1 tablet (15 mg total) by mouth daily. 30 tablet 0   Semaglutide-Weight Management (WEGOVY) 0.25 MG/0.5ML SOAJ Inject 0.25 mg into the skin once a week. (Patient not taking: Reported on 06/28/2023) 2 mL 1   Current Facility-Administered Medications  Medication Dose Route Frequency Provider Last Rate Last Admin    medroxyPROGESTERone (DEPO-PROVERA) injection 150 mg  150 mg Intramuscular Once Brock Bad, MD        Review of Systems Review of Systems Constitutional: negative for fatigue and weight loss Respiratory: negative for cough and wheezing Cardiovascular: negative for chest pain, fatigue and palpitations Gastrointestinal: negative for abdominal pain and change in bowel habits Genitourinary: positive for irregular vaginal bleeding and vaginal discharge and no distress Integument/breast: negative for nipple discharge Musculoskeletal:negative for myalgias Neurological: negative for gait problems and tremors Behavioral/Psych: negative for abusive relationship, depression Endocrine: negative for temperature intolerance      Blood pressure (!) 131/92, pulse (!) 109, height 5\' 3"  (1.6 m), weight (!) 329 lb 11.2 oz (149.6 kg), last menstrual period 06/17/2023.  Physical Exam Physical Exam General:   Alert and no distress  Skin:   no rash or abnormalities  Lungs:   clear to auscultation bilaterally  Heart:   regular rate and rhythm, S1, S2 normal, no murmur, click, rub or gallop  Breasts:   normal without suspicious masses, skin or nipple changes or axillary nodes  Abdomen:  normal findings: no organomegaly, soft, non-tender and no hernia  Pelvis:  External genitalia: normal general appearance Urinary system: urethral meatus normal and bladder without fullness, nontender Vaginal: normal without tenderness, induration or masses Cervix: normal appearance  Adnexa: normal bimanual exam Uterus: anteverted and non-tender, normal size    I have spent a total of 20 minutes of face-to-face time, excluding clinical staff time, reviewing notes and preparing to see patient, ordering tests and/or medications, and counseling the patient.   Data Reviewed Wet Prep and Cultures  Assessment     1. Abnormal uterine bleeding (AUB) (Primary) Rx: - US PELVIC COMPLETE WITH TRANSVAGINAL; Future - TSH -  CBC - Comp Met (CMET)  2. Vaginal discharge Rx: - Cervicovaginal ancillary only  3. Screen for STD (sexually transmitted disease) Rx: - HIV Antibody (routine testing w rflx) - Hepatitis B surface antigen - RPR - Hepatitis C antibody  4. Depo-Provera contraceptive status - has been on Depo Provera for > 5 years - recommend discontinuing Depo Provera  5. Class 3 severe obesity with serious comorbidity and body mass index (BMI) of 50.0 to 59.9 in adult, unspecified obesity type (HCC) - weight reduction with the aid of dietary changes, exercise and behavioral modification recommended  6. History of pulmonary embolism     Plan Follow up in 2 weeks    Orders Placed This Encounter  Procedures   US PELVIC COMPLETE WITH TRANSVAGINAL    Standing Status:   Future    Expiration Date:   06/27/2024    Reason for Exam (SYMPTOM  OR DIAGNOSIS REQUIRED):   AUB    Preferred imaging location?:   [facility] Ultrasound   HIV Antibody (routine testing w rflx)   Hepatitis B surface antigen   RPR   Hepatitis C antibody   TSH   CBC   Comp Met (CMET)    Brock Bad, MD, FACOG Attending Obstetrician & Gynecologist, [facility] for [employer], [facility], [place] 06/28/2023

## 2023-06-28 NOTE — Progress Notes (Signed)
Pt presents for irregular bleeding

## 2023-06-29 LAB — CERVICOVAGINAL ANCILLARY ONLY
Bacterial Vaginitis (gardnerella): POSITIVE — AB
Candida Glabrata: NEGATIVE
Candida Vaginitis: NEGATIVE
Chlamydia: NEGATIVE
Comment: NEGATIVE
Comment: NEGATIVE
Comment: NEGATIVE
Comment: NEGATIVE
Comment: NEGATIVE
Comment: NORMAL
Neisseria Gonorrhea: NEGATIVE
Trichomonas: NEGATIVE

## 2023-06-29 LAB — RPR: RPR Ser Ql: NONREACTIVE

## 2023-06-29 LAB — HEPATITIS C ANTIBODY: Hep C Virus Ab: NONREACTIVE

## 2023-06-29 LAB — HEPATITIS B SURFACE ANTIGEN: Hepatitis B Surface Ag: NEGATIVE

## 2023-06-29 LAB — HIV ANTIBODY (ROUTINE TESTING W REFLEX): HIV Screen 4th Generation wRfx: NONREACTIVE

## 2023-06-30 ENCOUNTER — Other Ambulatory Visit: Payer: Self-pay | Admitting: Obstetrics

## 2023-06-30 DIAGNOSIS — B9689 Other specified bacterial agents as the cause of diseases classified elsewhere: Secondary | ICD-10-CM

## 2023-06-30 MED ORDER — METRONIDAZOLE 500 MG PO TABS: 500.0000 mg | ORAL_TABLET | Freq: Two times a day (BID) | ORAL | 2 refills | Status: DC

## 2023-07-04 ENCOUNTER — Other Ambulatory Visit: Payer: Self-pay | Admitting: Internal Medicine

## 2023-07-04 DIAGNOSIS — I10 Essential (primary) hypertension: Secondary | ICD-10-CM

## 2023-07-06 ENCOUNTER — Other Ambulatory Visit (HOSPITAL_COMMUNITY): Payer: Self-pay

## 2023-07-06 ENCOUNTER — Ambulatory Visit (HOSPITAL_COMMUNITY)

## 2023-07-06 MED ORDER — HYDROCHLOROTHIAZIDE 25 MG PO TABS
25.0000 mg | ORAL_TABLET | Freq: Every day | ORAL | 0 refills | Status: DC
  Filled 2023-07-06: qty 30, 30d supply, fill #0

## 2023-07-06 MED ORDER — RIVAROXABAN 15 MG PO TABS
15.0000 mg | ORAL_TABLET | Freq: Every day | ORAL | 0 refills | Status: DC
  Filled 2023-07-06: qty 30, 30d supply, fill #0

## 2023-07-12 ENCOUNTER — Ambulatory Visit (HOSPITAL_COMMUNITY)
Admission: RE | Admit: 2023-07-12 | Discharge: 2023-07-12 | Disposition: A | Discharge status: Home / Self Care | Source: Ambulatory Visit | Attending: Obstetrics | Admitting: Obstetrics

## 2023-07-12 DIAGNOSIS — N939 Abnormal uterine and vaginal bleeding, unspecified: Secondary | ICD-10-CM | POA: Insufficient documentation

## 2023-07-20 ENCOUNTER — Telehealth: Admitting: Obstetrics

## 2023-07-20 ENCOUNTER — Encounter: Payer: Self-pay | Admitting: Obstetrics

## 2023-07-20 DIAGNOSIS — N939 Abnormal uterine and vaginal bleeding, unspecified: Secondary | ICD-10-CM | POA: Diagnosis not present

## 2023-07-20 DIAGNOSIS — Z6841 Body Mass Index (BMI) 40.0 and over, adult: Secondary | ICD-10-CM

## 2023-07-20 DIAGNOSIS — E66813 Obesity, class 3: Secondary | ICD-10-CM

## 2023-07-20 DIAGNOSIS — Z86711 Personal history of pulmonary embolism: Secondary | ICD-10-CM

## 2023-07-20 NOTE — Progress Notes (Signed)
 F/U for U/S results. Will discuss issues at that time with Dr.Harper.

## 2023-07-20 NOTE — Progress Notes (Signed)
 GYNECOLOGY VIRTUAL VISIT ENCOUNTER NOTE  Provider location: Center for [facility]   Patient location: Home  I connected with Karen Robles on 07/20/23 at  1:50 PM EDT by MyChart Video Encounter and verified that I am speaking with the correct person using two identifiers.   I discussed the limitations, risks, security and privacy concerns of performing an evaluation and management service virtually and the availability of in person appointments. I also discussed with the patient that there may be a patient responsible charge related to this service. The patient expressed understanding and agreed to proceed.   History:  Karen Robles is a 43 y.o. [id number] female being evaluated today for follow up for irregular periods on Depo Provera.  Ultrasound done.. She denies any abnormal vaginal discharge, pelvic pain or other concerns.       Past Medical History:  Diagnosis Date   Hypertension    Obesity    Pulmonary embolism (HCC)    Pulmonary embolism (HCC) 2015   Past Surgical History:  Procedure Laterality Date   MOUTH SURGERY     WISDOM TOOTH EXTRACTION Bilateral    The following portions of the patient's history were reviewed and updated as appropriate: allergies, current medications, past family history, past medical history, past social history, past surgical history and problem list.   Health Maintenance:  Normal pap and negative HRHPV on 08/21/2022.  Normal mammogram on 09/12/2022.   Review of Systems:  Pertinent items noted in HPI and remainder of comprehensive ROS otherwise negative.  Physical Exam:   General:  Alert, oriented and cooperative. Patient appears to be in no acute distress.  Mental Status: Normal mood and affect. Normal behavior. Normal judgment and thought content.   Respiratory: Normal respiratory effort, no problems with respiration noted  Rest of physical exam deferred due to type of encounter  Labs and Imaging No results found for  this or any previous visit (from the past 2 weeks). US PELVIC COMPLETE WITH TRANSVAGINAL Result Date: 07/15/2023 CLINICAL DATA:  Abnormal urine plea. EXAM: TRANSABDOMINAL AND TRANSVAGINAL ULTRASOUND OF PELVIS TECHNIQUE: Both transabdominal and transvaginal ultrasound examinations of the pelvis were performed. Transabdominal technique was performed for global imaging of the pelvis including uterus, ovaries, adnexal regions, and pelvic cul-de-sac. It was necessary to proceed with endovaginal exam following the transabdominal exam to visualize the uterus, endometrium and ovaries. COMPARISON:  None Available. FINDINGS: Uterus Measurements: 7.4 x 3.9 x 4.6 cm = volume: 69 mL. No fibroids or other mass visualized. Endometrium Thickness: 2.6 mm.  There is a 4 mm endometrial cyst. Right ovary Measurements: 1.9 x 1.5 x 1.4 cm = volume: 2.1 mL. Normal appearance/no adnexal mass. Left ovary Measurements: 1.5 x 1.3 x 1.3 cm = volume: 1.4 mL. Normal appearance/no adnexal mass. Other findings No abnormal free fluid. IMPRESSION: 1. No acute abnormality identified. 2. 4 mm endometrial cyst. Electronically Signed   By: Sherian Rein M.D.   On: 07/15/2023 16:56       Assessment and Plan:     1. Abnormal uterine bleeding (AUB) (Primary) - has been on Depo for > 5 years - recommended non-hormonal contraception with her history of a PE, and we discussed the ParaGuard IUD as a recommendation, and she aggred to try it  2. Class 3 severe obesity with serious comorbidity and body mass index (BMI) of 50.0 to 59.9 in adult, unspecified obesity type (HCC) - weight reduction with the aid of dietary changes, exercise and behavioral modification recommended  3. History of pulmonary embolism - clinically stable -she understands that estrogen containing contraceptives are contraindicated        I discussed the assessment and treatment plan with the patient. The patient was provided an opportunity to ask questions and all were  answered. The patient agreed with the plan and demonstrated an understanding of the instructions.   The patient was advised to call back or seek an in-person evaluation/go to the ED if the symptoms worsen or if the condition fails to improve as anticipated.  I provided 10 minutes of non face-to-face time during this encounter. I also spent 10 minutes dedicated to the care of this patient including pre-visit review of records, post visit ordering of medications and appropriate tests or procedures, coordinating care and documenting this visit encounter.    Elana Grayer, MD Center for [facility], [facility], [facility] 07/20/2023

## 2023-07-30 ENCOUNTER — Ambulatory Visit: Payer: 59

## 2023-08-12 ENCOUNTER — Other Ambulatory Visit (HOSPITAL_COMMUNITY): Payer: Self-pay

## 2023-08-12 ENCOUNTER — Other Ambulatory Visit: Payer: Self-pay | Admitting: Internal Medicine

## 2023-08-12 ENCOUNTER — Telehealth: Payer: Self-pay | Admitting: Internal Medicine

## 2023-08-12 DIAGNOSIS — I10 Essential (primary) hypertension: Secondary | ICD-10-CM

## 2023-08-12 MED ORDER — HYDROCHLOROTHIAZIDE 25 MG PO TABS
25.0000 mg | ORAL_TABLET | Freq: Every day | ORAL | 0 refills | Status: DC
  Filled 2023-08-12: qty 30, 30d supply, fill #0

## 2023-08-12 MED ORDER — RIVAROXABAN 15 MG PO TABS
15.0000 mg | ORAL_TABLET | Freq: Every day | ORAL | 0 refills | Status: DC
  Filled 2023-08-12: qty 30, 30d supply, fill #0

## 2023-08-12 NOTE — Telephone Encounter (Signed)
 Let patient know that refill was sent on Xarelto  and hydrochlorothiazide .  She needs appointment with me prior to her next refill request.  Please give her an appointment.

## 2023-08-13 ENCOUNTER — Other Ambulatory Visit (HOSPITAL_COMMUNITY): Payer: Self-pay

## 2023-08-14 ENCOUNTER — Other Ambulatory Visit (HOSPITAL_COMMUNITY): Payer: Self-pay

## 2023-08-24 NOTE — Telephone Encounter (Signed)
 Called but no answer. LVM informing that the requested refill was sent to the pharmacy and will need another appointment for additional refills.

## 2023-08-31 ENCOUNTER — Ambulatory Visit: Admitting: Obstetrics & Gynecology

## 2023-09-09 ENCOUNTER — Other Ambulatory Visit: Payer: Self-pay | Admitting: Internal Medicine

## 2023-09-09 ENCOUNTER — Ambulatory Visit (INDEPENDENT_AMBULATORY_CARE_PROVIDER_SITE_OTHER): Admitting: Obstetrics & Gynecology

## 2023-09-09 VITALS — BP 131/89 | HR 96 | Wt 331.0 lb

## 2023-09-09 DIAGNOSIS — Z3202 Encounter for pregnancy test, result negative: Secondary | ICD-10-CM | POA: Diagnosis not present

## 2023-09-09 DIAGNOSIS — I10 Essential (primary) hypertension: Secondary | ICD-10-CM

## 2023-09-09 DIAGNOSIS — Z30430 Encounter for insertion of intrauterine contraceptive device: Secondary | ICD-10-CM

## 2023-09-09 LAB — POCT URINE PREGNANCY: Preg Test, Ur: NEGATIVE

## 2023-09-09 MED ORDER — LEVONORGESTREL 20 MCG/DAY IU IUD
1.0000 | INTRAUTERINE_SYSTEM | Freq: Once | INTRAUTERINE | Status: AC
  Administered 2023-09-09: 1 via INTRAUTERINE

## 2023-09-09 NOTE — Progress Notes (Signed)
    GYNECOLOGY OFFICE PROCEDURE NOTE  Karen Robles is a 43 y.o. [phone] here for Mirena IUD insertion. No GYN concerns.  Last pap smear was on 08/21/22 and was normal.  IUD Insertion Procedure Note Patient identified, informed consent performed, consent signed.   Discussed risks of irregular bleeding, cramping, infection, malpositioning or misplacement of the IUD outside the uterus which may require further procedure such as laparoscopy. Also discussed >99% contraception efficacy, increased risk of ectopic pregnancy with failure of method.   Emphasized that this did not protect against STIs, condoms recommended during all sexual encounters. Time out was performed.  Chaperone present.  Urine pregnancy test negative.  Speculum placed in the vagina.  Cervix visualized.  Cleaned with Betadine x 2.  Grasped anteriorly with a single tooth tenaculum.  Uterus sounded to 8 cm.  Mirena IUD placed per manufacturer's recommendations.  Strings trimmed to 3 cm. Tenaculum was removed, good hemostasis noted.  Patient tolerated procedure well.   Patient was given post-procedure instructions.  She was advised to have backup contraception for one week.  Patient was also asked to check IUD strings periodically and follow up in 4 weeks for IUD check.   [name], MD Obstetrician & Gynecologist, [facility], [facility] GroupPatient ID: [name], female   DOB: May 08, 1980, 43 y.o.   MRN: [id number]

## 2023-09-09 NOTE — Progress Notes (Addendum)
 43 y.o. GYN presents for IUD Insertion.  UPT  Negative.   Administrations This Visit     levonorgestrel (MIRENA) 20 MCG/DAY IUD 1 each     Admin Date 09/09/2023 Action Given Dose 1 each Route Intrauterine Documented By Mancel Seashore, RMA

## 2023-09-15 ENCOUNTER — Other Ambulatory Visit: Payer: Self-pay | Admitting: Internal Medicine

## 2023-09-15 DIAGNOSIS — I10 Essential (primary) hypertension: Secondary | ICD-10-CM

## 2023-09-17 ENCOUNTER — Other Ambulatory Visit: Payer: Self-pay | Admitting: Internal Medicine

## 2023-09-17 DIAGNOSIS — I10 Essential (primary) hypertension: Secondary | ICD-10-CM

## 2023-09-20 ENCOUNTER — Encounter (HOSPITAL_COMMUNITY): Payer: Self-pay

## 2023-09-20 ENCOUNTER — Other Ambulatory Visit (HOSPITAL_COMMUNITY): Payer: Self-pay

## 2023-09-20 ENCOUNTER — Encounter: Payer: Self-pay | Admitting: Internal Medicine

## 2023-09-20 MED ORDER — HYDROCHLOROTHIAZIDE 25 MG PO TABS
25.0000 mg | ORAL_TABLET | Freq: Every day | ORAL | 0 refills | Status: DC
  Filled 2023-09-20: qty 30, 30d supply, fill #0

## 2023-09-20 MED ORDER — RIVAROXABAN 15 MG PO TABS
15.0000 mg | ORAL_TABLET | Freq: Every day | ORAL | 0 refills | Status: DC
  Filled 2023-09-20: qty 30, 30d supply, fill #0

## 2023-09-21 ENCOUNTER — Other Ambulatory Visit (HOSPITAL_COMMUNITY): Payer: Self-pay

## 2023-10-12 ENCOUNTER — Ambulatory Visit: Admitting: Obstetrics & Gynecology

## 2023-10-22 ENCOUNTER — Other Ambulatory Visit: Payer: Self-pay | Admitting: Internal Medicine

## 2023-10-22 DIAGNOSIS — I10 Essential (primary) hypertension: Secondary | ICD-10-CM

## 2023-10-23 ENCOUNTER — Other Ambulatory Visit: Payer: Self-pay | Admitting: Internal Medicine

## 2023-10-23 DIAGNOSIS — I10 Essential (primary) hypertension: Secondary | ICD-10-CM

## 2023-10-25 ENCOUNTER — Other Ambulatory Visit (HOSPITAL_COMMUNITY): Payer: Self-pay

## 2023-10-25 ENCOUNTER — Other Ambulatory Visit: Payer: Self-pay

## 2023-10-25 MED ORDER — RIVAROXABAN 15 MG PO TABS
15.0000 mg | ORAL_TABLET | Freq: Every day | ORAL | 0 refills | Status: DC
  Filled 2023-10-25: qty 30, 30d supply, fill #0

## 2023-10-25 MED ORDER — HYDROCHLOROTHIAZIDE 25 MG PO TABS
25.0000 mg | ORAL_TABLET | Freq: Every day | ORAL | 0 refills | Status: DC
  Filled 2023-10-25: qty 30, 30d supply, fill #0

## 2023-11-03 ENCOUNTER — Telehealth: Payer: Self-pay | Admitting: Internal Medicine

## 2023-11-03 NOTE — Telephone Encounter (Signed)
 Doris Volunteer LVM for pt to confirm appt for  7/31

## 2023-11-04 ENCOUNTER — Ambulatory Visit: Attending: Internal Medicine | Admitting: Internal Medicine

## 2023-11-23 ENCOUNTER — Other Ambulatory Visit: Payer: Self-pay | Admitting: Internal Medicine

## 2023-11-23 DIAGNOSIS — I10 Essential (primary) hypertension: Secondary | ICD-10-CM

## 2023-11-24 ENCOUNTER — Other Ambulatory Visit (HOSPITAL_COMMUNITY): Payer: Self-pay

## 2023-11-24 ENCOUNTER — Encounter (HOSPITAL_COMMUNITY): Payer: Self-pay

## 2023-11-29 ENCOUNTER — Other Ambulatory Visit: Payer: Self-pay | Admitting: Internal Medicine

## 2023-11-29 DIAGNOSIS — I10 Essential (primary) hypertension: Secondary | ICD-10-CM

## 2023-11-30 ENCOUNTER — Other Ambulatory Visit: Payer: Self-pay

## 2023-11-30 ENCOUNTER — Other Ambulatory Visit (HOSPITAL_COMMUNITY): Payer: Self-pay

## 2023-11-30 ENCOUNTER — Telehealth: Payer: Self-pay | Admitting: Internal Medicine

## 2023-11-30 MED ORDER — HYDROCHLOROTHIAZIDE 25 MG PO TABS
25.0000 mg | ORAL_TABLET | Freq: Every day | ORAL | 2 refills | Status: DC
  Filled 2023-11-30: qty 30, 30d supply, fill #0
  Filled 2023-12-31: qty 30, 30d supply, fill #1
  Filled 2024-01-28: qty 30, 30d supply, fill #2

## 2023-11-30 NOTE — Telephone Encounter (Signed)
 Called & spoke to the patient. Verified name & DOB. Informed that the hydrochlorothiazide  has been sent to the pharmacy. Patient is now requesting for a refill for Xarelto  to last next appointment. Physical appointment scheduled for 12/14/23.

## 2023-11-30 NOTE — Telephone Encounter (Signed)
 Copied from CRM #[id number]. Topic: Clinical - Medication Question >> Nov 29, 2023  4:05 PM Marissa P wrote: Reason for CRM: Patient called to let doctor know that she is waiting for an update on meds and she is out, she sent in request and its showing pending

## 2023-12-01 ENCOUNTER — Other Ambulatory Visit: Payer: Self-pay

## 2023-12-01 ENCOUNTER — Other Ambulatory Visit (HOSPITAL_COMMUNITY): Payer: Self-pay

## 2023-12-01 MED ORDER — RIVAROXABAN 15 MG PO TABS
15.0000 mg | ORAL_TABLET | Freq: Every day | ORAL | 0 refills | Status: DC
  Filled 2023-12-01: qty 15, 15d supply, fill #0

## 2023-12-13 ENCOUNTER — Telehealth: Payer: Self-pay | Admitting: Internal Medicine

## 2023-12-13 ENCOUNTER — Other Ambulatory Visit: Payer: Self-pay | Admitting: Internal Medicine

## 2023-12-13 NOTE — Telephone Encounter (Signed)
 Called to confirm appt 9/9 unable to complete call

## 2023-12-14 ENCOUNTER — Encounter: Admitting: Internal Medicine

## 2023-12-14 NOTE — Telephone Encounter (Signed)
 Requested medications are due for refill today.  A little too soon  Requested medications are on the active medications list.  yes  Last refill. 12/01/2023 #15 0 rf  Future visit scheduled.   no  Notes to clinic.  Labs are expired.    Requested Prescriptions  Pending Prescriptions Disp Refills   Rivaroxaban  (XARELTO ) 15 MG TABS tablet 15 tablet 0    Sig: Take 1 tablet (15 mg total) by mouth daily. Please schedule appointment for additional refills.     Hematology: Anticoagulants - rivaroxaban  Failed - 12/14/2023  5:23 PM      Failed - ALT in normal range and within 360 days    ALT  Date Value Ref Range Status  07/03/2022 14 0 - 44 U/L Final         Failed - AST in normal range and within 360 days    AST  Date Value Ref Range Status  07/03/2022 14 (L) 15 - 41 U/L Final         Failed - Cr in normal range and within 360 days    Creatinine  Date Value Ref Range Status  07/03/2022 1.01 (H) 0.44 - 1.00 mg/dL Final   Creat  Date Value Ref Range Status  04/14/2016 1.09 0.50 - 1.10 mg/dL Final   Creatinine, Ser  Date Value Ref Range Status  12/11/2022 1.11 (H) 0.57 - 1.00 mg/dL Final         Failed - HCT in normal range and within 360 days    HCT  Date Value Ref Range Status  07/03/2022 38.2 36.0 - 46.0 % Final   Hematocrit  Date Value Ref Range Status  06/01/2022 43.0 34.0 - 46.6 % Final         Failed - HGB in normal range and within 360 days    Hemoglobin  Date Value Ref Range Status  07/03/2022 12.3 12.0 - 15.0 g/dL Final  97/73/7975 86.2 11.1 - 15.9 g/dL Final         Failed - PLT in normal range and within 360 days    Platelets  Date Value Ref Range Status  06/01/2022 491 (H) 150 - 450 x10E3/uL Final   Platelet Count  Date Value Ref Range Status  07/03/2022 457 (H) 150 - 400 K/uL Final         Failed - eGFR is 15 or above and within 360 days    GFR, Est African American  Date Value Ref Range Status  04/14/2016 77 >=60 mL/min Final   GFR calc Af  Amer  Date Value Ref Range Status  04/16/2020 79 >59 mL/min/1.73 Final    Comment:    **In accordance with recommendations from the NKF-ASN Task force,**   [facility] is in the process of updating its eGFR calculation to the   2021 CKD-EPI creatinine equation that estimates kidney function   without a race variable.    GFR, Est Non African American  Date Value Ref Range Status  04/14/2016 67 >=60 mL/min Final   GFR, Estimated  Date Value Ref Range Status  07/03/2022 >60 >60 mL/min Final    Comment:    (NOTE) Calculated using the CKD-EPI Creatinine Equation (2021)    eGFR  Date Value Ref Range Status  12/11/2022 64 >59 mL/min/1.73 Final         Passed - Patient is not pregnant      Passed - Valid encounter within last 12 months    Recent Outpatient Visits  10 months ago Morbid obesity with BMI of 50.0-59.9, adult [facility])   [facility] [place]. [facility] Barnie NOVAK, MD   1 year ago Morbid obesity with BMI of 50.0-59.9, adult [facility])   [facility] [place]. [facility] Barnie NOVAK, MD   1 year ago Essential hypertension   [facility] [place]. [facility] Barnie NOVAK, MD   2 years ago Essential hypertension   [facility] [place]. [facility] Barnie NOVAK, MD   2 years ago Essential hypertension   [facility] [place]. [facility] Barnie NOVAK, MD

## 2023-12-15 ENCOUNTER — Other Ambulatory Visit (HOSPITAL_COMMUNITY): Payer: Self-pay

## 2023-12-16 ENCOUNTER — Other Ambulatory Visit (HOSPITAL_COMMUNITY): Payer: Self-pay

## 2023-12-16 ENCOUNTER — Telehealth: Payer: Self-pay | Admitting: Internal Medicine

## 2023-12-16 MED ORDER — RIVAROXABAN 15 MG PO TABS
15.0000 mg | ORAL_TABLET | Freq: Every day | ORAL | 0 refills | Status: DC
  Filled 2023-12-16: qty 30, 30d supply, fill #0

## 2023-12-16 NOTE — Telephone Encounter (Signed)
 This pt is on a high risk medication/blood thinner. I have not seen her since 02/2023. Had appt this past Monday but I had to be out. Please reschedule her to be seen sometime within the next 1-5 wks.

## 2023-12-17 ENCOUNTER — Other Ambulatory Visit (HOSPITAL_COMMUNITY): Payer: Self-pay

## 2023-12-17 ENCOUNTER — Other Ambulatory Visit: Payer: Self-pay

## 2023-12-20 NOTE — Telephone Encounter (Signed)
 Send her a Mychart message with appt date and time.

## 2023-12-20 NOTE — Telephone Encounter (Signed)
 Noted! Thank you

## 2023-12-22 NOTE — Telephone Encounter (Signed)
 In-person appointment scheduled for 01/17/2024. Mychart message sent to the patient informing of appointment.

## 2024-01-11 ENCOUNTER — Encounter: Admitting: Internal Medicine

## 2024-01-14 ENCOUNTER — Other Ambulatory Visit: Payer: Self-pay | Admitting: Internal Medicine

## 2024-01-15 ENCOUNTER — Other Ambulatory Visit (HOSPITAL_COMMUNITY): Payer: Self-pay

## 2024-01-15 MED ORDER — RIVAROXABAN 15 MG PO TABS
15.0000 mg | ORAL_TABLET | Freq: Every day | ORAL | 0 refills | Status: DC
  Filled 2024-01-15: qty 30, 30d supply, fill #0

## 2024-01-17 ENCOUNTER — Other Ambulatory Visit: Payer: Self-pay

## 2024-01-17 ENCOUNTER — Ambulatory Visit: Admitting: Internal Medicine

## 2024-02-17 ENCOUNTER — Other Ambulatory Visit: Payer: Self-pay | Admitting: Internal Medicine

## 2024-02-25 ENCOUNTER — Other Ambulatory Visit: Payer: Self-pay | Admitting: Internal Medicine

## 2024-02-25 DIAGNOSIS — I10 Essential (primary) hypertension: Secondary | ICD-10-CM

## 2024-03-06 ENCOUNTER — Other Ambulatory Visit: Payer: Self-pay | Admitting: Internal Medicine

## 2024-03-06 DIAGNOSIS — I10 Essential (primary) hypertension: Secondary | ICD-10-CM

## 2024-03-07 ENCOUNTER — Other Ambulatory Visit: Payer: Self-pay | Admitting: Internal Medicine

## 2024-03-07 DIAGNOSIS — I10 Essential (primary) hypertension: Secondary | ICD-10-CM

## 2024-03-10 ENCOUNTER — Other Ambulatory Visit: Payer: Self-pay | Admitting: Internal Medicine

## 2024-03-10 DIAGNOSIS — I10 Essential (primary) hypertension: Secondary | ICD-10-CM

## 2024-03-12 MED ORDER — HYDROCHLOROTHIAZIDE 25 MG PO TABS
25.0000 mg | ORAL_TABLET | Freq: Every day | ORAL | 0 refills | Status: DC
  Filled 2024-03-12: qty 30, 30d supply, fill #0

## 2024-03-12 MED ORDER — RIVAROXABAN 15 MG PO TABS
15.0000 mg | ORAL_TABLET | Freq: Every day | ORAL | 0 refills | Status: DC
  Filled 2024-03-12: qty 30, 30d supply, fill #0

## 2024-03-12 NOTE — Telephone Encounter (Signed)
 Pt has requested Rfs on meds which I denied as she no showed last 3 appts with me and has now been more than 1 yr since last appt..  Looks like she is now scheduled for Jan 30th. Please let her know she needs to be seen before then. I will give 1 mth RF and she has to get in with me in-person within that time frame. No further Rfs will be granted.

## 2024-03-13 ENCOUNTER — Other Ambulatory Visit (HOSPITAL_COMMUNITY): Payer: Self-pay

## 2024-03-13 ENCOUNTER — Other Ambulatory Visit: Payer: Self-pay

## 2024-03-13 NOTE — Telephone Encounter (Signed)
 Called & spoke to the patient. Verified name & DOB. Informed of Dr.Johnson's message below. Rescheduled appointment to 04/03/2024. Patient confirmed appointment.

## 2024-04-03 ENCOUNTER — Other Ambulatory Visit: Payer: Self-pay

## 2024-04-03 ENCOUNTER — Ambulatory Visit: Attending: Internal Medicine | Admitting: Internal Medicine

## 2024-04-03 VITALS — BP 115/81 | HR 97 | Ht 63.0 in | Wt 335.2 lb

## 2024-04-03 DIAGNOSIS — D72829 Elevated white blood cell count, unspecified: Secondary | ICD-10-CM | POA: Diagnosis not present

## 2024-04-03 DIAGNOSIS — Z6841 Body Mass Index (BMI) 40.0 and over, adult: Secondary | ICD-10-CM | POA: Diagnosis not present

## 2024-04-03 DIAGNOSIS — I10 Essential (primary) hypertension: Secondary | ICD-10-CM | POA: Diagnosis not present

## 2024-04-03 DIAGNOSIS — R7303 Prediabetes: Secondary | ICD-10-CM | POA: Diagnosis not present

## 2024-04-03 DIAGNOSIS — Z86711 Personal history of pulmonary embolism: Secondary | ICD-10-CM

## 2024-04-03 DIAGNOSIS — Z23 Encounter for immunization: Secondary | ICD-10-CM | POA: Diagnosis not present

## 2024-04-03 MED ORDER — RIVAROXABAN 15 MG PO TABS
15.0000 mg | ORAL_TABLET | Freq: Every day | ORAL | 1 refills | Status: AC
  Filled 2024-04-03 – 2024-04-07 (×2): qty 90, 90d supply, fill #0

## 2024-04-03 MED ORDER — HYDROCHLOROTHIAZIDE 25 MG PO TABS
25.0000 mg | ORAL_TABLET | Freq: Every day | ORAL | 1 refills | Status: AC
  Filled 2024-04-03 – 2024-04-07 (×2): qty 90, 90d supply, fill #0

## 2024-04-03 MED ORDER — POTASSIUM CHLORIDE CRYS ER 20 MEQ PO TBCR
40.0000 meq | EXTENDED_RELEASE_TABLET | Freq: Every day | ORAL | 1 refills | Status: AC
  Filled 2024-04-03: qty 180, 90d supply, fill #0

## 2024-04-03 NOTE — Patient Instructions (Signed)
" °  VISIT SUMMARY: Today, you came in for a follow-up visit to discuss your hypertension, history of pulmonary embolism, and weight management. We also reviewed your medications and discussed some general health maintenance topics.  YOUR PLAN: -MORBID OBESITY: Morbid obesity means having a body mass index (BMI) of 40 or higher, which can increase the risk of various health problems. Your weight has increased to 335 pounds. We discussed weight reduction surgery as an option and referred you to a bariatric surgery clinic for evaluation. You were encouraged to reduce your soda intake and consider self-purchasing Ozempic  if it is affordable for you.  -ESSENTIAL HYPERTENSION: Essential hypertension is high blood pressure with no identifiable cause. Your blood pressure goal is 130/80 mmHg, and your current reading is 115/81 mmHg. We refilled your hydrochlorothiazide  25 mg daily and encouraged you to monitor your blood pressure regularly. We also discussed the possibility of potassium supplementation with your medication.  -HISTORY OF PULMONARY EMBOLISM: A pulmonary embolism is a blockage in one of the pulmonary arteries in your lungs. You are currently on Xarelto  15 mg daily with no reported issues. We refilled your prescription for Xarelto  15 mg daily.  -LEUKOCYTOSIS: Leukocytosis is an increase in the number of white blood cells in your blood, which can indicate an infection or other health issues. We ordered blood tests to recheck your blood count.  -GENERAL HEALTH MAINTENANCE: We discussed your dietary habits and encouraged you to reduce your soda intake. You received a flu shot today. We also ordered blood tests for liver function, cholesterol, and A1c to monitor your overall health.  INSTRUCTIONS: Please follow up with the bariatric surgery clinic for your weight reduction evaluation. Continue taking your medications as prescribed and monitor your blood pressure regularly. Make sure to get your blood  tests done as ordered. If you have any questions or concerns, please do not hesitate to contact our office.                      Contains text generated by [employer].                                 Contains text generated by [employer]bridge.   "

## 2024-04-03 NOTE — Progress Notes (Addendum)
 "   Patient ID: Karen Robles, female    DOB: [birth year]  MRN: [id number]  CC: Medical Management of Chronic Issues (Medication refill /Flu vax administered on 04/03/24 - C.A.)   Subjective: Karen Robles is a 43 y.o. female who presents for chronic ds management. 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   Discussed the use of AI scribe software for clinical note transcription with the patient, who gave verbal consent to proceed.  History of Present Illness Karen Robles is a 43 year old female with hypertension and a history of pulmonary embolism who presents for a follow-up visit and medication refills.  HTN: She is currently taking hydrochlorothiazide  25 mg daily for hypertension. She checks her blood pressure infrequently, with the last check being two months ago at a [employer] machine, which she recalls was 'fine' but does not remember the exact reading. Should be on Potassium supplement 20 meq 2 tabs daily as well but has not been taking due to misplacing her bottle on recent move. No chest pain, shortness of breath, or leg swelling. She attempts to limit her salt intake.  She has a history of pulmonary embolism and is on Xarelto , with no reported bruising or bleeding.  Her weight has increased from 320 pounds in November of last year to 335 pounds currently. She was unable to obtain Wegovy  due to insurance denial. She endorses being told that she  snoring by her ex and children, but denies daytime sleepiness or falling asleep while driving. Wakes feeling she has had adequate sleep. She sometimes experiences morning headaches.  She is working on improving her eating habits, consuming more chicken and less red meat, but drinks about two sodas per day. She occasionally drinks juice.  She has a history of prediabetes and will undergo screening for diabetes.   She was previously on Depo-Provera  but is now  using a Mirena  IUD.  She has a persistent mild elevation in her white blood cell and platelet counts, previously evaluated by a hematologist. She was suppose to go back for f/u but Dr. Bernie has left the practice.     Patient Active Problem List   Diagnosis Date Noted   Primary hypercoagulable state 08/03/2022   Chronic anticoagulation 08/03/2022   Granulocytosis 07/16/2022   Thrombocytosis 08/18/2021   Leukocytosis 08/18/2021   Influenza vaccine needed 04/16/2020   COVID-19 vaccine series completed 04/16/2020   History of pulmonary embolism 07/22/2017   Depo-Provera  contraceptive status 06/11/2016   Severe obesity (BMI >= 40) (HCC) 07/26/2013   Essential hypertension 03/07/2013     Medications Ordered Prior to Encounter[1]  Allergies[2]  Social History   Socioeconomic History   Marital status: Single    Spouse name: Not on file   Number of children: 2   Years of education: Not on file   Highest education level: 12th grade  Occupational History   Occupation: Unemployed  Tobacco Use   Smoking status: Never   Smokeless tobacco: Never  Vaping Use   Vaping status: Never Used  Substance and Sexual Activity   Alcohol use: No    Alcohol/week: 0.0 standard drinks of alcohol   Drug use: No   Sexual activity: Yes    Partners: Male    Birth control/protection: Injection  Other Topics Concern   Not on file  Social History Narrative   Not on file   Social Drivers of Health   Tobacco Use:  Low Risk (07/20/2023)   Patient History    Smoking Tobacco Use: Never    Smokeless Tobacco Use: Never    Passive Exposure: Not on file  Financial Resource Strain: Patient Declined (04/03/2024)   Overall Financial Resource Strain (CARDIA)    Difficulty of Paying Living Expenses: Patient declined  Food Insecurity: Patient Declined (04/03/2024)   Epic    Worried About Programme Researcher, Broadcasting/film/video in the Last Year: Patient declined    Barista in the Last Year: Patient declined   Transportation Needs: Patient Declined (04/03/2024)   Epic    Lack of Transportation (Medical): Patient declined    Lack of Transportation (Non-Medical): Patient declined  Physical Activity: Insufficiently Active (04/03/2024)   Exercise Vital Sign    Days of Exercise per Week: 4 days    Minutes of Exercise per Session: 30 min  Stress: No Stress Concern Present (04/03/2024)   [employer] of Occupational Health - Occupational Stress Questionnaire    Feeling of Stress: Only a little  Social Connections: Unknown (04/03/2024)   Social Connection and Isolation Panel    Frequency of Communication with Friends and Family: Patient declined    Frequency of Social Gatherings with Friends and Family: Patient declined    Attends Religious Services: Patient declined    Active Member of Clubs or Organizations: No    Attends Engineer, Structural: Not on file    Marital Status: Patient declined  Intimate Partner Violence: Not At Risk (02/11/2023)   Humiliation, Afraid, Rape, and Kick questionnaire    Fear of Current or Ex-Partner: No    Emotionally Abused: No    Physically Abused: No    Sexually Abused: No  Depression (PHQ2-9): Low Risk (04/03/2024)   Depression (PHQ2-9)    PHQ-2 Score: 1  Alcohol Screen: Low Risk (02/11/2023)   Alcohol Screen    Last Alcohol Screening Score (AUDIT): 0  Housing: High Risk (04/03/2024)   Epic    Unable to Pay for Housing in the Last Year: Yes    Number of Times Moved in the Last Year: 1    Homeless in the Last Year: Not on file  Utilities: Not At Risk (02/11/2023)   [employer] Utilities    Threatened with loss of utilities: No  Health Literacy: Adequate Health Literacy (02/11/2023)   B1300 Health Literacy    Frequency of need for help with medical instructions: Never    Family History  Problem Relation Age of Onset   Hypertension Mother        maternal aunts & uncles   Hyperlipidemia Mother    Healthy Brother    Hypertension Maternal Aunt     Hypertension Maternal Uncle    Cancer Maternal Grandmother    Diabetes Neg Hx    Heart disease Neg Hx     Past Surgical History:  Procedure Laterality Date   MOUTH SURGERY     WISDOM TOOTH EXTRACTION Bilateral     ROS: Review of Systems Negative except as stated above  PHYSICAL EXAM: BP 115/81   Pulse 97   Ht 5' 3 (1.6 m)   Wt (!) 335 lb 3.2 oz (152 kg)   SpO2 96%   BMI 59.38 kg/m   Wt Readings from Last 3 Encounters:  04/03/24 (!) 335 lb 3.2 oz (152 kg)  09/09/23 (!) 331 lb (150.1 kg)  06/28/23 (!) 329 lb 11.2 oz (149.6 kg)    Physical Exam General appearance - alert, well appearing, morbidly obese AAF  and in no distress Mental status - normal mood, behavior, speech, dress, motor activity, and thought processes Neck - supple, no significant adenopathy Chest - clear to auscultation, no wheezes, rales or rhonchi, symmetric air entry Heart - normal rate, regular rhythm, normal S1, S2, no murmurs, rubs, clicks or gallops Extremities - peripheral pulses normal, no pedal edema, no clubbing or cyanosis     04/03/2024    4:26 PM 02/11/2023    9:45 AM 12/11/2022    1:57 PM  Depression screen PHQ 2/9  Decreased Interest 0 0 0  Down, Depressed, Hopeless 0 0 0  PHQ - 2 Score 0 0 0  Altered sleeping 0 0 0  Tired, decreased energy 0 0   Change in appetite 1 0   Feeling bad or failure about yourself  0 0   Trouble concentrating 0 0   Moving slowly or fidgety/restless 0 0   Suicidal thoughts 0 0   PHQ-9 Score 1 0  0   Difficult doing work/chores Somewhat difficult Not difficult at all      Data saved with a previous flowsheet row definition       Latest Ref Rng & Units 12/11/2022    2:41 PM 07/14/2022    1:39 PM 07/03/2022   12:26 PM  CMP  Glucose 70 - 99 mg/dL 74   91   BUN 6 - 24 mg/dL 20   20   Creatinine 9.42 - 1.00 mg/dL 8.88   8.98   Sodium 865 - 144 mmol/L 139   138   Potassium 3.5 - 5.2 mmol/L 3.4  3.5  2.8   Chloride 96 - 106 mmol/L 100   103   CO2 20 - 29  mmol/L 26   29   Calcium 8.7 - 10.2 mg/dL 9.2   9.0   Total Protein 6.5 - 8.1 g/dL   7.9   Total Bilirubin 0.3 - 1.2 mg/dL   0.3   Alkaline Phos 38 - 126 U/L   67   AST 15 - 41 U/L   14   ALT 0 - 44 U/L   14    Lipid Panel     Component Value Date/Time   CHOL 158 06/01/2022 1524   TRIG 113 06/01/2022 1524   HDL 36 (L) 06/01/2022 1524   CHOLHDL 4.4 06/01/2022 1524   CHOLHDL 3.9 09/05/2013 1148   VLDL 24 09/05/2013 1148   LDLCALC 101 (H) 06/01/2022 1524    CBC    Component Value Date/Time   WBC 12.2 (H) 07/03/2022 1226   WBC 9.3 04/14/2016 0918   RBC 4.68 07/03/2022 1226   HGB 12.3 07/03/2022 1226   HGB 13.7 06/01/2022 1524   HCT 38.2 07/03/2022 1226   HCT 43.0 06/01/2022 1524   PLT 457 (H) 07/03/2022 1226   PLT 491 (H) 06/01/2022 1524   MCV 81.6 07/03/2022 1226   MCV 81 06/01/2022 1524   MCH 26.3 07/03/2022 1226   MCHC 32.2 07/03/2022 1226   RDW 14.6 07/03/2022 1226   RDW 14.0 06/01/2022 1524   LYMPHSABS 4.3 (H) 07/03/2022 1226   LYMPHSABS 3.6 (H) 08/18/2021 0915   MONOABS 0.5 07/03/2022 1226   EOSABS 0.2 07/03/2022 1226   EOSABS 0.3 08/18/2021 0915   BASOSABS 0.1 07/03/2022 1226   BASOSABS 0.1 08/18/2021 0915    ASSESSMENT AND PLAN: 1. Essential hypertension (Primary) Close to goal - Refilled hydrochlorothiazide  25 mg daily. - Encouraged regular blood pressure monitoring. - Discussed importance of taking potassium supplementation  with hydrochlorothiazide . - hydrochlorothiazide  (HYDRODIURIL ) 25 MG tablet; Take 1 tablet (25 mg total) by mouth daily.  Dispense: 90 tablet; Refill: 1 - potassium chloride  SA (KLOR-CON  M) 20 MEQ tablet; Take 2 tablets (40 mEq total) by mouth daily.  Dispense: 180 tablet; Refill: 1 - Comprehensive metabolic panel with GFR  2. History of pulmonary embolism On Xarelto  15 mg daily with no reported bruising or bleeding.  Plan for lifelong anticoagulation. We will check CBC and chemistry today. - Refilled Xarelto  15 mg daily.  -  Rivaroxaban  (XARELTO ) 15 MG TABS tablet; Take 1 tablet (15 mg total) by mouth daily.  Dispense: 90 tablet; Refill: 1  3. Morbid obesity with BMI of 50.0-59.9, adult [facility]) Patient does not meet criteria for insurance to cover Wegovy  or Zepbound. Encourage to eliminate sugary drinks from her diet. 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  She walks for 45 minutes twice during each shift.  Encouraged to continue. Inquire whether she has considered weight reduction surgery as an option and she states that she has and is agreeable to referral to a bariatric surgeon. - Lipid panel - Hemoglobin A1c - Amb Referral to Bariatric Surgery  4. Leukocytosis, unspecified type - CBC with Differential/Platelet  5. Need for immunization against influenza - Flu vaccine trivalent PF, 6mos and older(Flulaval,Afluria,Fluarix,Fluzone)  6. Prediabetes See #3 above. - Hemoglobin A1c   Patient was given the opportunity to ask questions.  Patient verbalized understanding of the plan and was able to repeat key elements of the plan.   This documentation was completed using Paediatric nurse.  Any transcriptional errors are unintentional.  Orders Placed This Encounter  Procedures   Flu vaccine trivalent PF, 6mos and older(Flulaval,Afluria,Fluarix,Fluzone)   CBC with Differential/Platelet   Comprehensive metabolic panel with GFR   Lipid panel   Hemoglobin A1c   Amb Referral to Bariatric Surgery     Requested Prescriptions   Signed Prescriptions Disp Refills   hydrochlorothiazide  (HYDRODIURIL ) 25 MG tablet 90 tablet 1    Sig: Take 1 tablet (25 mg total) by mouth daily.   potassium chloride  SA (KLOR-CON  M) 20 MEQ tablet 180 tablet 1    Sig: Take 2 tablets (40 mEq total) by mouth daily.   Rivaroxaban  (XARELTO ) 15 MG TABS tablet 90 tablet 1    Sig: Take 1 tablet (15 mg total) by mouth daily.    Return in about 4 months (around 08/02/2024).  Barnie Louder, MD, FACP     [1]  No current outpatient medications on file prior to visit.   No current facility-administered medications on file prior to visit.  [2] No Known Allergies  "

## 2024-04-04 ENCOUNTER — Ambulatory Visit: Payer: Self-pay | Admitting: Internal Medicine

## 2024-04-04 ENCOUNTER — Other Ambulatory Visit: Payer: Self-pay | Admitting: Internal Medicine

## 2024-04-04 DIAGNOSIS — I10 Essential (primary) hypertension: Secondary | ICD-10-CM

## 2024-04-04 LAB — LIPID PANEL
Chol/HDL Ratio: 4.2 ratio (ref 0.0–4.4)
Cholesterol, Total: 180 mg/dL (ref 100–199)
HDL: 43 mg/dL
LDL Chol Calc (NIH): 114 mg/dL — ABNORMAL HIGH (ref 0–99)
Triglycerides: 131 mg/dL (ref 0–149)
VLDL Cholesterol Cal: 23 mg/dL (ref 5–40)

## 2024-04-04 LAB — COMPREHENSIVE METABOLIC PANEL WITH GFR
Albumin: 3.8 g/dL — ABNORMAL LOW (ref 3.9–4.9)
BUN/Creatinine Ratio: 18 (ref 9–23)
BUN: 20 mg/dL (ref 6–24)
Bilirubin Total: 0.5 mg/dL (ref 0.0–1.2)
CO2: 27 mmol/L (ref 20–29)
Calcium: 9.1 mg/dL (ref 8.7–10.2)
Chloride: 95 mmol/L — ABNORMAL LOW (ref 96–106)
Creatinine, Ser: 1.13 mg/dL — ABNORMAL HIGH (ref 0.57–1.00)
Globulin, Total: 5.1 g/dL — ABNORMAL HIGH (ref 1.5–4.5)
Glucose: 73 mg/dL (ref 70–99)
Sodium: 133 mmol/L — ABNORMAL LOW (ref 134–144)
Total Protein: 8.9 g/dL — ABNORMAL HIGH (ref 6.0–8.5)
eGFR: 63 mL/min/1.73

## 2024-04-04 LAB — CBC WITH DIFFERENTIAL/PLATELET
Basophils Absolute: 0.1 x10E3/uL (ref 0.0–0.2)
Basos: 1 %
EOS (ABSOLUTE): 0.3 x10E3/uL (ref 0.0–0.4)
Eos: 3 %
Hematocrit: 46.2 % (ref 34.0–46.6)
Hemoglobin: 14.2 g/dL (ref 11.1–15.9)
Immature Grans (Abs): 0 x10E3/uL (ref 0.0–0.1)
Immature Granulocytes: 0 %
Lymphocytes Absolute: 4.6 x10E3/uL — ABNORMAL HIGH (ref 0.7–3.1)
Lymphs: 33 %
MCH: 25.9 pg — ABNORMAL LOW (ref 26.6–33.0)
MCHC: 30.7 g/dL — ABNORMAL LOW (ref 31.5–35.7)
MCV: 84 fL (ref 79–97)
Monocytes Absolute: 0.6 x10E3/uL (ref 0.1–0.9)
Monocytes: 4 %
Neutrophils Absolute: 8.1 x10E3/uL — ABNORMAL HIGH (ref 1.4–7.0)
Neutrophils: 59 %
Platelets: 488 x10E3/uL — ABNORMAL HIGH (ref 150–450)
RBC: 5.49 x10E6/uL — ABNORMAL HIGH (ref 3.77–5.28)
RDW: 13.8 % (ref 11.7–15.4)
WBC: 13.8 x10E3/uL — ABNORMAL HIGH (ref 3.4–10.8)

## 2024-04-04 LAB — HEMOGLOBIN A1C
Est. average glucose Bld gHb Est-mCnc: 123 mg/dL
Hgb A1c MFr Bld: 5.9 % — ABNORMAL HIGH (ref 4.8–5.6)

## 2024-04-05 ENCOUNTER — Other Ambulatory Visit: Payer: Self-pay

## 2024-04-05 ENCOUNTER — Ambulatory Visit

## 2024-04-05 DIAGNOSIS — I10 Essential (primary) hypertension: Secondary | ICD-10-CM

## 2024-04-06 ENCOUNTER — Other Ambulatory Visit: Payer: Self-pay | Admitting: Obstetrics

## 2024-04-06 ENCOUNTER — Ambulatory Visit: Payer: Self-pay | Admitting: Internal Medicine

## 2024-04-06 DIAGNOSIS — B9689 Other specified bacterial agents as the cause of diseases classified elsewhere: Secondary | ICD-10-CM

## 2024-04-06 LAB — COMPREHENSIVE METABOLIC PANEL WITH GFR
ALT: 15 IU/L (ref 0–32)
AST: 18 IU/L (ref 0–40)
Albumin: 3.6 g/dL — ABNORMAL LOW (ref 3.9–4.9)
Alkaline Phosphatase: 95 IU/L (ref 41–116)
BUN/Creatinine Ratio: 20 (ref 9–23)
BUN: 20 mg/dL (ref 6–24)
Bilirubin Total: 0.4 mg/dL (ref 0.0–1.2)
CO2: 28 mmol/L (ref 20–29)
Calcium: 9.1 mg/dL (ref 8.7–10.2)
Chloride: 98 mmol/L (ref 96–106)
Creatinine, Ser: 1 mg/dL (ref 0.57–1.00)
Globulin, Total: 3.9 g/dL (ref 1.5–4.5)
Glucose: 82 mg/dL (ref 70–99)
Potassium: 3.5 mmol/L (ref 3.5–5.2)
Sodium: 138 mmol/L (ref 134–144)
Total Protein: 7.5 g/dL (ref 6.0–8.5)
eGFR: 73 mL/min/1.73

## 2024-04-07 ENCOUNTER — Other Ambulatory Visit (HOSPITAL_BASED_OUTPATIENT_CLINIC_OR_DEPARTMENT_OTHER): Payer: Self-pay

## 2024-04-07 ENCOUNTER — Other Ambulatory Visit: Payer: Self-pay

## 2024-04-07 ENCOUNTER — Other Ambulatory Visit (HOSPITAL_COMMUNITY): Payer: Self-pay

## 2024-05-05 ENCOUNTER — Ambulatory Visit: Admitting: Internal Medicine

## 2024-07-24 ENCOUNTER — Ambulatory Visit: Admitting: Internal Medicine

## 2024-07-28 ENCOUNTER — Ambulatory Visit: Payer: Self-pay | Admitting: Internal Medicine
